# Patient Record
Sex: Female | Born: 1965 | Race: White | Hispanic: No | Smoking: Former smoker
Health system: Southern US, Community
[De-identification: ages and names within clinical notes are randomized; demographics above are authoritative.]

## PROBLEM LIST (undated history)

## (undated) DIAGNOSIS — R519 Headache, unspecified: Secondary | ICD-10-CM

## (undated) DIAGNOSIS — J45909 Unspecified asthma, uncomplicated: Secondary | ICD-10-CM

## (undated) DIAGNOSIS — F419 Anxiety disorder, unspecified: Secondary | ICD-10-CM

## (undated) DIAGNOSIS — R011 Cardiac murmur, unspecified: Secondary | ICD-10-CM

## (undated) DIAGNOSIS — J449 Chronic obstructive pulmonary disease, unspecified: Secondary | ICD-10-CM

## (undated) DIAGNOSIS — R51 Headache: Secondary | ICD-10-CM

## (undated) DIAGNOSIS — Z8489 Family history of other specified conditions: Secondary | ICD-10-CM

## (undated) DIAGNOSIS — C4441 Basal cell carcinoma of skin of scalp and neck: Secondary | ICD-10-CM

## (undated) DIAGNOSIS — D649 Anemia, unspecified: Secondary | ICD-10-CM

## (undated) DIAGNOSIS — F4310 Post-traumatic stress disorder, unspecified: Secondary | ICD-10-CM

## (undated) DIAGNOSIS — Z8719 Personal history of other diseases of the digestive system: Secondary | ICD-10-CM

## (undated) DIAGNOSIS — K219 Gastro-esophageal reflux disease without esophagitis: Secondary | ICD-10-CM

## (undated) DIAGNOSIS — E079 Disorder of thyroid, unspecified: Secondary | ICD-10-CM

## (undated) DIAGNOSIS — F32A Depression, unspecified: Secondary | ICD-10-CM

## (undated) DIAGNOSIS — R112 Nausea with vomiting, unspecified: Secondary | ICD-10-CM

## (undated) DIAGNOSIS — M1990 Unspecified osteoarthritis, unspecified site: Secondary | ICD-10-CM

## (undated) DIAGNOSIS — F329 Major depressive disorder, single episode, unspecified: Secondary | ICD-10-CM

## (undated) DIAGNOSIS — G43909 Migraine, unspecified, not intractable, without status migrainosus: Secondary | ICD-10-CM

## (undated) DIAGNOSIS — E669 Obesity, unspecified: Secondary | ICD-10-CM

## (undated) DIAGNOSIS — F319 Bipolar disorder, unspecified: Secondary | ICD-10-CM

## (undated) DIAGNOSIS — M797 Fibromyalgia: Secondary | ICD-10-CM

## (undated) HISTORY — PX: MULTIPLE TOOTH EXTRACTIONS: SHX2053

## (undated) HISTORY — DX: Chronic obstructive pulmonary disease, unspecified: J44.9

## (undated) HISTORY — PX: MOLE REMOVAL: SHX2046

## (undated) HISTORY — PX: ABDOMINAL HYSTERECTOMY: SHX81

## (undated) HISTORY — DX: Unspecified osteoarthritis, unspecified site: M19.90

---

## 1998-04-18 ENCOUNTER — Other Ambulatory Visit: Admission: RE | Admit: 1998-04-18 | Discharge: 1998-04-18 | Payer: Self-pay | Admitting: Family Medicine

## 1998-04-25 ENCOUNTER — Ambulatory Visit (HOSPITAL_COMMUNITY): Admission: RE | Admit: 1998-04-25 | Discharge: 1998-04-25 | Payer: Self-pay | Admitting: Family Medicine

## 1998-04-29 ENCOUNTER — Other Ambulatory Visit: Admission: RE | Admit: 1998-04-29 | Discharge: 1998-04-29 | Payer: Self-pay | Admitting: Family Medicine

## 1998-12-28 ENCOUNTER — Emergency Department (HOSPITAL_COMMUNITY): Admission: EM | Admit: 1998-12-28 | Discharge: 1998-12-28 | Payer: Self-pay | Admitting: Emergency Medicine

## 1999-03-27 ENCOUNTER — Emergency Department (HOSPITAL_COMMUNITY): Admission: EM | Admit: 1999-03-27 | Discharge: 1999-03-27 | Payer: Self-pay | Admitting: *Deleted

## 1999-11-05 ENCOUNTER — Encounter: Payer: Self-pay | Admitting: Emergency Medicine

## 1999-11-05 ENCOUNTER — Emergency Department (HOSPITAL_COMMUNITY): Admission: EM | Admit: 1999-11-05 | Discharge: 1999-11-05 | Payer: Self-pay | Admitting: Emergency Medicine

## 1999-11-11 ENCOUNTER — Emergency Department (HOSPITAL_COMMUNITY): Admission: EM | Admit: 1999-11-11 | Discharge: 1999-11-11 | Payer: Self-pay | Admitting: Emergency Medicine

## 2000-02-12 ENCOUNTER — Emergency Department (HOSPITAL_COMMUNITY): Admission: EM | Admit: 2000-02-12 | Discharge: 2000-02-12 | Payer: Self-pay | Admitting: Emergency Medicine

## 2000-02-12 ENCOUNTER — Encounter: Payer: Self-pay | Admitting: Emergency Medicine

## 2000-07-22 ENCOUNTER — Encounter: Payer: Self-pay | Admitting: Emergency Medicine

## 2000-07-22 ENCOUNTER — Emergency Department (HOSPITAL_COMMUNITY): Admission: EM | Admit: 2000-07-22 | Discharge: 2000-07-22 | Payer: Self-pay | Admitting: Emergency Medicine

## 2000-07-24 ENCOUNTER — Emergency Department (HOSPITAL_COMMUNITY): Admission: EM | Admit: 2000-07-24 | Discharge: 2000-07-24 | Payer: Self-pay | Admitting: Emergency Medicine

## 2000-07-26 ENCOUNTER — Emergency Department (HOSPITAL_COMMUNITY): Admission: EM | Admit: 2000-07-26 | Discharge: 2000-07-26 | Payer: Self-pay | Admitting: Emergency Medicine

## 2001-01-03 ENCOUNTER — Encounter: Payer: Self-pay | Admitting: Internal Medicine

## 2001-01-03 ENCOUNTER — Inpatient Hospital Stay (HOSPITAL_COMMUNITY): Admission: EM | Admit: 2001-01-03 | Discharge: 2001-01-06 | Payer: Self-pay | Admitting: Emergency Medicine

## 2001-01-04 ENCOUNTER — Encounter: Payer: Self-pay | Admitting: Internal Medicine

## 2001-04-18 ENCOUNTER — Inpatient Hospital Stay (HOSPITAL_COMMUNITY): Admission: EM | Admit: 2001-04-18 | Discharge: 2001-04-20 | Payer: Self-pay | Admitting: Emergency Medicine

## 2001-04-18 ENCOUNTER — Encounter: Payer: Self-pay | Admitting: Emergency Medicine

## 2001-05-10 ENCOUNTER — Other Ambulatory Visit: Admission: RE | Admit: 2001-05-10 | Discharge: 2001-05-10 | Payer: Self-pay | Admitting: Obstetrics & Gynecology

## 2001-05-10 ENCOUNTER — Encounter: Admission: RE | Admit: 2001-05-10 | Discharge: 2001-05-10 | Payer: Self-pay | Admitting: Obstetrics & Gynecology

## 2004-09-16 ENCOUNTER — Encounter: Admission: RE | Admit: 2004-09-16 | Discharge: 2004-09-16 | Payer: Self-pay | Admitting: Family Medicine

## 2005-01-11 ENCOUNTER — Emergency Department (HOSPITAL_COMMUNITY): Admission: EM | Admit: 2005-01-11 | Discharge: 2005-01-11 | Payer: Self-pay | Admitting: Family Medicine

## 2005-10-26 ENCOUNTER — Emergency Department (HOSPITAL_COMMUNITY): Admission: EM | Admit: 2005-10-26 | Discharge: 2005-10-26 | Payer: Self-pay | Admitting: Emergency Medicine

## 2007-04-26 ENCOUNTER — Emergency Department (HOSPITAL_COMMUNITY): Admission: EM | Admit: 2007-04-26 | Discharge: 2007-04-26 | Payer: Self-pay | Admitting: Emergency Medicine

## 2007-05-29 ENCOUNTER — Emergency Department (HOSPITAL_COMMUNITY): Admission: EM | Admit: 2007-05-29 | Discharge: 2007-05-29 | Payer: Self-pay | Admitting: Emergency Medicine

## 2007-12-09 ENCOUNTER — Emergency Department (HOSPITAL_COMMUNITY): Admission: EM | Admit: 2007-12-09 | Discharge: 2007-12-09 | Payer: Self-pay | Admitting: Emergency Medicine

## 2008-07-04 ENCOUNTER — Encounter: Admission: RE | Admit: 2008-07-04 | Discharge: 2008-07-04 | Payer: Self-pay | Admitting: Family Medicine

## 2008-09-09 ENCOUNTER — Emergency Department (HOSPITAL_COMMUNITY): Admission: EM | Admit: 2008-09-09 | Discharge: 2008-09-09 | Payer: Self-pay | Admitting: Family Medicine

## 2009-09-02 ENCOUNTER — Emergency Department (HOSPITAL_COMMUNITY): Admission: EM | Admit: 2009-09-02 | Discharge: 2009-09-02 | Payer: Self-pay | Admitting: Emergency Medicine

## 2010-01-13 ENCOUNTER — Emergency Department (HOSPITAL_COMMUNITY): Admission: EM | Admit: 2010-01-13 | Discharge: 2010-01-13 | Payer: Self-pay | Admitting: Emergency Medicine

## 2010-12-03 ENCOUNTER — Emergency Department (HOSPITAL_COMMUNITY)
Admission: EM | Admit: 2010-12-03 | Discharge: 2010-12-03 | Payer: Self-pay | Source: Home / Self Care | Admitting: Emergency Medicine

## 2010-12-03 LAB — CBC
HCT: 40 % (ref 36.0–46.0)
Hemoglobin: 13.6 g/dL (ref 12.0–15.0)
MCH: 31.2 pg (ref 26.0–34.0)
MCHC: 34 g/dL (ref 30.0–36.0)
MCV: 91.7 fL (ref 78.0–100.0)
Platelets: 179 10*3/uL (ref 150–400)
RBC: 4.36 MIL/uL (ref 3.87–5.11)
RDW: 12.6 % (ref 11.5–15.5)
WBC: 12.2 10*3/uL — ABNORMAL HIGH (ref 4.0–10.5)

## 2010-12-03 LAB — COMPREHENSIVE METABOLIC PANEL
ALT: 15 U/L (ref 0–35)
AST: 19 U/L (ref 0–37)
Albumin: 4.1 g/dL (ref 3.5–5.2)
Alkaline Phosphatase: 74 U/L (ref 39–117)
BUN: 11 mg/dL (ref 6–23)
CO2: 22 mEq/L (ref 19–32)
Calcium: 9.5 mg/dL (ref 8.4–10.5)
Chloride: 108 mEq/L (ref 96–112)
Creatinine, Ser: 0.7 mg/dL (ref 0.4–1.2)
GFR calc Af Amer: >60 mL/min (ref >60)
GFR calc non Af Amer: >60 mL/min (ref >60)
Glucose, Bld: 141 mg/dL — ABNORMAL HIGH (ref 70–99)
Potassium: 3.8 mEq/L (ref 3.5–5.1)
Sodium: 139 mEq/L (ref 135–145)
Total Bilirubin: 0.4 mg/dL (ref 0.3–1.2)
Total Protein: 6.9 g/dL (ref 6.0–8.3)

## 2010-12-03 LAB — DIFFERENTIAL
Basophils Absolute: 0 10*3/uL (ref 0.0–0.1)
Basophils Relative: 0 % (ref 0–1)
Eosinophils Absolute: 0.1 10*3/uL (ref 0.0–0.7)
Eosinophils Relative: 1 % (ref 0–5)
Lymphocytes Relative: 15 % (ref 12–46)
Lymphs Abs: 1.9 10*3/uL (ref 0.7–4.0)
Monocytes Absolute: 0.6 10*3/uL (ref 0.1–1.0)
Monocytes Relative: 5 % (ref 3–12)
Neutro Abs: 9.7 10*3/uL — ABNORMAL HIGH (ref 1.7–7.7)
Neutrophils Relative %: 79 % — ABNORMAL HIGH (ref 43–77)

## 2010-12-03 LAB — URINE MICROSCOPIC-ADD ON

## 2010-12-03 LAB — URINALYSIS, ROUTINE W REFLEX MICROSCOPIC
Bilirubin Urine: NEGATIVE
Ketones, ur: NEGATIVE mg/dL
Leukocytes, UA: NEGATIVE
Nitrite: NEGATIVE
Protein, ur: 30 mg/dL — AB
Specific Gravity, Urine: 1.024 (ref 1.005–1.030)
Urine Glucose, Fasting: NEGATIVE mg/dL
Urobilinogen, UA: 0.2 mg/dL (ref 0.0–1.0)
pH: 6.5 (ref 5.0–8.0)

## 2010-12-03 LAB — POCT PREGNANCY, URINE: Preg Test, Ur: NEGATIVE

## 2010-12-05 ENCOUNTER — Observation Stay (HOSPITAL_COMMUNITY)
Admission: EM | Admit: 2010-12-05 | Discharge: 2010-12-05 | Payer: Self-pay | Source: Home / Self Care | Admitting: Emergency Medicine

## 2010-12-15 LAB — DIFFERENTIAL
Basophils Absolute: 0 10*3/uL (ref 0.0–0.1)
Basophils Relative: 0 % (ref 0–1)
Eosinophils Absolute: 0 10*3/uL (ref 0.0–0.7)
Eosinophils Relative: 0 % (ref 0–5)
Lymphocytes Relative: 12 % (ref 12–46)
Lymphs Abs: 1.4 10*3/uL (ref 0.7–4.0)
Monocytes Absolute: 0.7 10*3/uL (ref 0.1–1.0)
Monocytes Relative: 6 % (ref 3–12)
Neutro Abs: 9.1 10*3/uL — ABNORMAL HIGH (ref 1.7–7.7)
Neutrophils Relative %: 81 % — ABNORMAL HIGH (ref 43–77)

## 2010-12-15 LAB — COMPREHENSIVE METABOLIC PANEL
ALT: 16 U/L (ref 0–35)
AST: 18 U/L (ref 0–37)
Albumin: 4.3 g/dL (ref 3.5–5.2)
Alkaline Phosphatase: 77 U/L (ref 39–117)
BUN: 12 mg/dL (ref 6–23)
CO2: 27 mEq/L (ref 19–32)
Calcium: 9.6 mg/dL (ref 8.4–10.5)
Chloride: 105 mEq/L (ref 96–112)
Creatinine, Ser: 0.9 mg/dL (ref 0.4–1.2)
GFR calc Af Amer: >60 mL/min (ref >60)
GFR calc non Af Amer: >60 mL/min (ref >60)
Glucose, Bld: 141 mg/dL — ABNORMAL HIGH (ref 70–99)
Potassium: 3.2 mEq/L — ABNORMAL LOW (ref 3.5–5.1)
Sodium: 142 mEq/L (ref 135–145)
Total Bilirubin: 0.6 mg/dL (ref 0.3–1.2)
Total Protein: 6.6 g/dL (ref 6.0–8.3)

## 2010-12-15 LAB — POCT PREGNANCY, URINE: Preg Test, Ur: NEGATIVE

## 2010-12-15 LAB — URINALYSIS, ROUTINE W REFLEX MICROSCOPIC
Bilirubin Urine: NEGATIVE
Hgb urine dipstick: NEGATIVE
Ketones, ur: NEGATIVE mg/dL
Leukocytes, UA: NEGATIVE
Nitrite: NEGATIVE
Protein, ur: NEGATIVE mg/dL
Specific Gravity, Urine: 1.02 (ref 1.005–1.030)
Urine Glucose, Fasting: NEGATIVE mg/dL
Urobilinogen, UA: 1 mg/dL (ref 0.0–1.0)
pH: 8.5 — ABNORMAL HIGH (ref 5.0–8.0)

## 2010-12-15 LAB — CBC
HCT: 38.4 % (ref 36.0–46.0)
Hemoglobin: 12.8 g/dL (ref 12.0–15.0)
MCH: 30.5 pg (ref 26.0–34.0)
MCHC: 33.3 g/dL (ref 30.0–36.0)
MCV: 91.6 fL (ref 78.0–100.0)
Platelets: 180 10*3/uL (ref 150–400)
RBC: 4.19 MIL/uL (ref 3.87–5.11)
RDW: 12.7 % (ref 11.5–15.5)
WBC: 11.2 10*3/uL — ABNORMAL HIGH (ref 4.0–10.5)

## 2010-12-15 LAB — URINE MICROSCOPIC-ADD ON

## 2010-12-15 LAB — LIPASE, BLOOD: Lipase: 14 U/L (ref 11–59)

## 2010-12-21 ENCOUNTER — Encounter: Payer: Self-pay | Admitting: Family Medicine

## 2010-12-21 ENCOUNTER — Encounter: Payer: Self-pay | Admitting: Orthopedic Surgery

## 2011-02-18 LAB — CULTURE, ROUTINE-ABSCESS

## 2011-03-05 LAB — POCT I-STAT, CHEM 8
Calcium, Ion: 1.05 mmol/L — ABNORMAL LOW (ref 1.12–1.32)
HCT: 41 % (ref 36.0–46.0)
Hemoglobin: 13.9 g/dL (ref 12.0–15.0)
Sodium: 136 mEq/L (ref 135–145)
TCO2: 20 mmol/L (ref 0–100)

## 2011-03-05 LAB — COMPREHENSIVE METABOLIC PANEL
AST: 17 U/L (ref 0–37)
Albumin: 3.9 g/dL (ref 3.5–5.2)
Alkaline Phosphatase: 85 U/L (ref 39–117)
BUN: 13 mg/dL (ref 6–23)
CO2: 21 mEq/L (ref 19–32)
Chloride: 105 mEq/L (ref 96–112)
Creatinine, Ser: 0.7 mg/dL (ref 0.4–1.2)
GFR calc Af Amer: >60 mL/min (ref >60)
GFR calc non Af Amer: >60 mL/min (ref >60)
Potassium: 3.9 mEq/L (ref 3.5–5.1)
Total Bilirubin: 0.6 mg/dL (ref 0.3–1.2)

## 2011-03-05 LAB — DIFFERENTIAL
Basophils Absolute: 0 10*3/uL (ref 0.0–0.1)
Basophils Relative: 0 % (ref 0–1)
Eosinophils Relative: 2 % (ref 0–5)
Lymphocytes Relative: 8 % — ABNORMAL LOW (ref 12–46)
Monocytes Absolute: 0.9 10*3/uL (ref 0.1–1.0)

## 2011-03-05 LAB — URINALYSIS, ROUTINE W REFLEX MICROSCOPIC
Ketones, ur: 15 mg/dL — AB
Nitrite: NEGATIVE
Protein, ur: NEGATIVE mg/dL
Urobilinogen, UA: 1 mg/dL (ref 0.0–1.0)

## 2011-03-05 LAB — PREGNANCY, URINE: Preg Test, Ur: NEGATIVE

## 2011-03-05 LAB — CBC
HCT: 39.5 % (ref 36.0–46.0)
MCV: 94.9 fL (ref 78.0–100.0)
Platelets: 131 10*3/uL — ABNORMAL LOW (ref 150–400)
RBC: 4.17 MIL/uL (ref 3.87–5.11)
WBC: 14.5 10*3/uL — ABNORMAL HIGH (ref 4.0–10.5)

## 2011-03-05 LAB — WET PREP, GENITAL
Trich, Wet Prep: NONE SEEN
Yeast Wet Prep HPF POC: NONE SEEN

## 2011-08-20 LAB — COMPREHENSIVE METABOLIC PANEL
ALT: 15
BUN: 18
CO2: 21
Calcium: 9.2
Creatinine, Ser: 0.78
GFR calc non Af Amer: >60
Glucose, Bld: 179 — ABNORMAL HIGH
Sodium: 137

## 2011-08-20 LAB — DIFFERENTIAL
Eosinophils Absolute: 0
Lymphs Abs: 0.5 — ABNORMAL LOW
Neutro Abs: 14.5 — ABNORMAL HIGH
Neutrophils Relative %: 92 — ABNORMAL HIGH

## 2011-08-20 LAB — CBC
HCT: 40.3
Hemoglobin: 13.9
MCHC: 34.5
MCV: 93
RBC: 4.33
RDW: 13.5

## 2011-08-20 LAB — LIPASE, BLOOD: Lipase: 18

## 2011-09-16 LAB — DIFFERENTIAL
Basophils Absolute: 0
Basophils Relative: 1
Eosinophils Absolute: 0
Eosinophils Relative: 0
Monocytes Absolute: 0.5

## 2011-09-16 LAB — URINE CULTURE

## 2011-09-16 LAB — CBC
HCT: 41.1
Platelets: 167
RBC: 4.41
WBC: 8.9

## 2011-09-16 LAB — COMPREHENSIVE METABOLIC PANEL
AST: 16
Albumin: 4
Alkaline Phosphatase: 94
Chloride: 104
GFR calc Af Amer: >60
Potassium: 3.7
Total Bilirubin: 0.8

## 2011-09-16 LAB — URINALYSIS, ROUTINE W REFLEX MICROSCOPIC
Hgb urine dipstick: NEGATIVE
Ketones, ur: 15 — AB
Protein, ur: NEGATIVE
Urobilinogen, UA: 1

## 2011-09-16 LAB — URINE MICROSCOPIC-ADD ON

## 2011-12-16 ENCOUNTER — Other Ambulatory Visit: Payer: Self-pay

## 2011-12-16 ENCOUNTER — Emergency Department (HOSPITAL_COMMUNITY): Payer: Self-pay

## 2011-12-16 ENCOUNTER — Emergency Department (HOSPITAL_COMMUNITY)
Admission: EM | Admit: 2011-12-16 | Discharge: 2011-12-16 | Disposition: A | Discharge status: Home / Self Care | Payer: Self-pay | Attending: Emergency Medicine | Admitting: Emergency Medicine

## 2011-12-16 ENCOUNTER — Encounter (HOSPITAL_COMMUNITY): Payer: Self-pay | Admitting: Emergency Medicine

## 2011-12-16 DIAGNOSIS — R109 Unspecified abdominal pain: Secondary | ICD-10-CM | POA: Insufficient documentation

## 2011-12-16 DIAGNOSIS — S31811A Laceration without foreign body of right buttock, initial encounter: Secondary | ICD-10-CM

## 2011-12-16 DIAGNOSIS — R296 Repeated falls: Secondary | ICD-10-CM | POA: Insufficient documentation

## 2011-12-16 DIAGNOSIS — R55 Syncope and collapse: Secondary | ICD-10-CM | POA: Insufficient documentation

## 2011-12-16 DIAGNOSIS — R110 Nausea: Secondary | ICD-10-CM | POA: Insufficient documentation

## 2011-12-16 DIAGNOSIS — S31809A Unspecified open wound of unspecified buttock, initial encounter: Secondary | ICD-10-CM | POA: Insufficient documentation

## 2011-12-16 HISTORY — DX: Major depressive disorder, single episode, unspecified: F32.9

## 2011-12-16 HISTORY — DX: Disorder of thyroid, unspecified: E07.9

## 2011-12-16 HISTORY — DX: Anxiety disorder, unspecified: F41.9

## 2011-12-16 HISTORY — DX: Post-traumatic stress disorder, unspecified: F43.10

## 2011-12-16 HISTORY — DX: Depression, unspecified: F32.A

## 2011-12-16 LAB — CBC
HCT: 35.5 % — ABNORMAL LOW (ref 36.0–46.0)
MCH: 31.3 pg (ref 26.0–34.0)
MCHC: 33.8 g/dL (ref 30.0–36.0)
MCV: 92.4 fL (ref 78.0–100.0)
Platelets: 140 10*3/uL — ABNORMAL LOW (ref 150–400)
RDW: 13.2 % (ref 11.5–15.5)

## 2011-12-16 LAB — DIFFERENTIAL
Basophils Absolute: 0 10*3/uL (ref 0.0–0.1)
Eosinophils Absolute: 0.1 10*3/uL (ref 0.0–0.7)
Eosinophils Relative: 1 % (ref 0–5)
Monocytes Absolute: 0.7 10*3/uL (ref 0.1–1.0)

## 2011-12-16 LAB — BASIC METABOLIC PANEL
Calcium: 9.2 mg/dL (ref 8.4–10.5)
Creatinine, Ser: 0.74 mg/dL (ref 0.50–1.10)
GFR calc non Af Amer: >90 mL/min (ref >90)
Glucose, Bld: 103 mg/dL — ABNORMAL HIGH (ref 70–99)
Sodium: 138 mEq/L (ref 135–145)

## 2011-12-16 LAB — TROPONIN I: Troponin I: <0.3 ng/mL (ref <0.30)

## 2011-12-16 MED ORDER — SODIUM CHLORIDE 0.9 % IV BOLUS (SEPSIS)
1000.0000 mL | Freq: Once | INTRAVENOUS | Status: AC
  Administered 2011-12-16: 1000 mL via INTRAVENOUS

## 2011-12-16 MED ORDER — IBUPROFEN 800 MG PO TABS
800.0000 mg | ORAL_TABLET | Freq: Once | ORAL | Status: AC
  Administered 2011-12-16: 800 mg via ORAL
  Filled 2011-12-16: qty 1
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
## 2011-12-16 NOTE — ED Notes (Signed)
Pt verbalized understanding of f/u care. Pt escorted to pov via w/c

## 2011-12-16 NOTE — ED Notes (Signed)
Provider at bedside placing sutures in left hip

## 2011-12-16 NOTE — ED Notes (Signed)
Report received from Jaci, RN.
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
## 2011-12-16 NOTE — ED Notes (Signed)
Pt out of room to radiology. 

## 2011-12-16 NOTE — ED Notes (Signed)
Ambulated to restroom, pt has steady gait. On way back to room pt states she became "swimmy headed and flushed." Notified Carlena Sax, MD new orders received.

## 2011-12-16 NOTE — ED Provider Notes (Signed)
I saw and evaluated the patient, reviewed the resident's note and I agree with the findings and plan.  Patient seen and examined in episode appears to be consistent with a vasovagal episode. History of vagal episodes in the past. Next one time she struck her hand and she passed out. No concern for dangerous cardiac arrhythmia this time. Patient had prodromal symptoms prior to her episode. She feels back to her baseline. Patient's wound to be sutured by the resident and patient will be discharged to home   Toy Baker, MD 12/16/11 2028

## 2011-12-16 NOTE — ED Notes (Signed)
Dr Isaias Cowman at bedside with pt

## 2011-12-16 NOTE — ED Provider Notes (Signed)
History     CSN: [id number]  Arrival date & time 12/16/11  1815   First MD Initiated Contact with Patient 12/16/11 1820      No chief complaint on file.   (Consider location/radiation/quality/duration/timing/severity/associated sxs/prior treatment) Patient is a 46 y.o. female presenting with syncope. The history is provided by the patient. No language interpreter was used.  Loss of Consciousness This is a new problem. The current episode started today. Episode frequency: once. The problem has been resolved. Associated symptoms include abdominal pain and nausea. Pertinent negatives include no chest pain, chills, coughing, fever, rash or vomiting. The symptoms are aggravated by nothing. She has tried nothing for the symptoms. The treatment provided no relief.    No past medical history on file.  No past surgical history on file.  No family history on file.  History  Substance Use Topics  . Smoking status: Not on file  . Smokeless tobacco: Not on file  . Alcohol Use: Not on file    OB History    No data available      Review of Systems  Constitutional: Negative for fever and chills.  Respiratory: Negative for cough and shortness of breath.   Cardiovascular: Positive for syncope. Negative for chest pain and leg swelling.  Gastrointestinal: Positive for nausea and abdominal pain. Negative for vomiting.  Skin: Negative for rash.  All other systems reviewed and are negative.    Allergies  Review of patient's allergies indicates not on file.  Home Medications  No current outpatient prescriptions on file.  There were no vitals taken for this visit.  Physical Exam  Nursing note and vitals reviewed. Constitutional: She is oriented to person, place, and time. She appears well-developed and well-nourished. No distress.  HENT:  Head: Normocephalic and atraumatic.  Eyes: EOM are normal. Pupils are equal, round, and reactive to light.  Neck: Normal range of motion. Neck  supple.  Cardiovascular: Normal rate and regular rhythm.  Exam reveals no friction rub.   No murmur heard. Pulmonary/Chest: Effort normal and breath sounds normal. No respiratory distress. She has no wheezes. She has no rales.  Abdominal: Soft. She exhibits no distension. There is no tenderness. There is no rebound.  Musculoskeletal: Normal range of motion. She exhibits no edema.       Laceration to R buttock, lateral.   Neurological: She is alert and oriented to person, place, and time. No cranial nerve deficit. Coordination normal.  Skin: Skin is warm and dry. She is not diaphoretic.    ED Course  LACERATION REPAIR Date/Time: 12/16/2011 10:04 PM Performed by: Elwin Mocha Authorized by: Lorre Nick T Consent: Verbal consent obtained. Risks and benefits: risks, benefits and alternatives were discussed Consent given by: patient Body area: trunk (R buttock) Laceration length: 2 cm Foreign bodies: no foreign bodies Tendon involvement: none Nerve involvement: none Vascular damage: no Anesthesia: local infiltration Local anesthetic: lidocaine 1% with epinephrine Anesthetic total: 8 ml Patient sedated: no Preparation: Patient was prepped and draped in the usual sterile fashion. Irrigation solution: saline Irrigation method: jet lavage Amount of cleaning: standard Debridement: none Degree of undermining: none Skin closure: staples Number of sutures: 4 Technique: simple Approximation: close Approximation difficulty: simple Patient tolerance: Patient tolerated the procedure well with no immediate complications.   (including critical care time)  Labs Reviewed  CBC - Abnormal; Notable for the following:    RBC 3.84 (*)    HCT 35.5 (*)    Platelets 140 (*)    All other  components within normal limits  BASIC METABOLIC PANEL - Abnormal; Notable for the following:    Glucose, Bld 103 (*)    All other components within normal limits  DIFFERENTIAL  TROPONIN I   Dg Hip  Complete Right  12/16/2011  *RADIOLOGY REPORT*  Clinical Data: Syncope four times today; fell and hit cabinet door with right hip.  Pain and deep laceration along the right hip.  RIGHT HIP - COMPLETE 2+ VIEW  Comparison: Thigh MRI performed 09/16/2004  Findings: There is no evidence of fracture or dislocation.  Both femoral heads are seated normally within their respective acetabula.  No significant degenerative change is appreciated.  The sacroiliac joints are unremarkable in appearance.  The visualized bowel gas pattern is grossly unremarkable in appearance.  The known soft tissue laceration along the proximal right leg is not well characterized on radiograph.  IMPRESSION: No evidence of fracture or dislocation.  Original Report Authenticated By: Tonia Ghent, M.D.   Ct Head Wo Contrast  12/16/2011  *RADIOLOGY REPORT*  Clinical Data: Fall.  Head trauma.  Headache  CT HEAD WITHOUT CONTRAST  Technique:  Contiguous axial images were obtained from the base of the skull through the vertex without contrast.  Comparison: None.  Findings: Ventricle size is normal.  Negative for intracranial hemorrhage.  Negative for infarct or mass.  Negative for skull fracture.  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  Small mastoid sinus effusion on the left.  IMPRESSION: No acute abnormality.  Original Report Authenticated By: Camelia Phenes, M.D.     1. Syncope   2. Laceration of right buttock without foreign body      Date: 12/16/2011  Rate:56  Rhythm: sinus bradycardia  QRS Axis: normal  Intervals: normal  ST/T Wave abnormalities: normal  Conduction Disutrbances:none  Narrative Interpretation:   Old EKG Reviewed: unchanged    MDM  46 year old female presents with a syncopal episode. No prior cardiac, neurologic history. Was cooking over a hot stove and had a feeling of warmth and then passed out. She hit her right buttock on a wooden cabinet and sustained a laceration. She has associated nausea, vomiting, mild  dyspnea. Vitals are stable. On exam patient's heart and lungs are normal. She is neuro intact and moving all extremities. She has no coordination deficits.  We'll check EKG,  baseline labs, troponin head CT. Head CT, reviewed by me, read as above, negative. Right hip x-ray negative for foreign bodies or fractures. Labs normal. Patient syncope likely vasovagal. On reexam she's feeling much better 6 visit she can go home.  Right hip wound irrigated and repaired with 4 staples. A laceration repair as above. Instructed to followup with Dr. Tanya Nones in 2-3 days for her syncope.   Elwin Mocha, MD 12/16/11 [phone]

## 2011-12-17 NOTE — ED Provider Notes (Signed)
I saw and evaluated the patient, reviewed the resident's note and I agree with the findings and plan.  Toy Baker, MD 12/17/11 [phone]

## 2014-06-27 ENCOUNTER — Emergency Department (HOSPITAL_COMMUNITY): Payer: Self-pay

## 2014-06-27 ENCOUNTER — Emergency Department (HOSPITAL_COMMUNITY)
Admission: EM | Admit: 2014-06-27 | Discharge: 2014-06-27 | Disposition: A | Discharge status: Home / Self Care | Payer: Self-pay | Attending: Emergency Medicine | Admitting: Emergency Medicine

## 2014-06-27 ENCOUNTER — Encounter (HOSPITAL_COMMUNITY): Payer: Self-pay | Admitting: Emergency Medicine

## 2014-06-27 DIAGNOSIS — F411 Generalized anxiety disorder: Secondary | ICD-10-CM | POA: Insufficient documentation

## 2014-06-27 DIAGNOSIS — Z8639 Personal history of other endocrine, nutritional and metabolic disease: Secondary | ICD-10-CM | POA: Insufficient documentation

## 2014-06-27 DIAGNOSIS — R1084 Generalized abdominal pain: Secondary | ICD-10-CM | POA: Insufficient documentation

## 2014-06-27 DIAGNOSIS — Z862 Personal history of diseases of the blood and blood-forming organs and certain disorders involving the immune mechanism: Secondary | ICD-10-CM | POA: Insufficient documentation

## 2014-06-27 DIAGNOSIS — R079 Chest pain, unspecified: Secondary | ICD-10-CM | POA: Insufficient documentation

## 2014-06-27 DIAGNOSIS — R112 Nausea with vomiting, unspecified: Secondary | ICD-10-CM | POA: Insufficient documentation

## 2014-06-27 DIAGNOSIS — R197 Diarrhea, unspecified: Secondary | ICD-10-CM | POA: Insufficient documentation

## 2014-06-27 DIAGNOSIS — R0789 Other chest pain: Secondary | ICD-10-CM | POA: Insufficient documentation

## 2014-06-27 DIAGNOSIS — Z3202 Encounter for pregnancy test, result negative: Secondary | ICD-10-CM | POA: Insufficient documentation

## 2014-06-27 DIAGNOSIS — F419 Anxiety disorder, unspecified: Secondary | ICD-10-CM

## 2014-06-27 LAB — CBC
HEMATOCRIT: 42.8 % (ref 36.0–46.0)
HEMOGLOBIN: 14.5 g/dL (ref 12.0–15.0)
MCH: 31.7 pg (ref 26.0–34.0)
MCHC: 33.9 g/dL (ref 30.0–36.0)
MCV: 93.4 fL (ref 78.0–100.0)
Platelets: 149 10*3/uL — ABNORMAL LOW (ref 150–400)
RBC: 4.58 MIL/uL (ref 3.87–5.11)
RDW: 13.7 % (ref 11.5–15.5)
WBC: 12.7 10*3/uL — ABNORMAL HIGH (ref 4.0–10.5)

## 2014-06-27 LAB — URINE MICROSCOPIC-ADD ON

## 2014-06-27 LAB — BASIC METABOLIC PANEL
Anion gap: 16 — ABNORMAL HIGH (ref 5–15)
BUN: 16 mg/dL (ref 6–23)
CALCIUM: 9.7 mg/dL (ref 8.4–10.5)
CO2: 19 meq/L (ref 19–32)
Chloride: 105 mEq/L (ref 96–112)
Creatinine, Ser: 0.58 mg/dL (ref 0.50–1.10)
GFR calc Af Amer: >90 mL/min (ref >90)
GFR calc non Af Amer: >90 mL/min (ref >90)
GLUCOSE: 138 mg/dL — AB (ref 70–99)
POTASSIUM: 4.3 meq/L (ref 3.7–5.3)
SODIUM: 140 meq/L (ref 137–147)

## 2014-06-27 LAB — URINALYSIS, ROUTINE W REFLEX MICROSCOPIC
Glucose, UA: NEGATIVE mg/dL
Ketones, ur: 15 mg/dL — AB
LEUKOCYTES UA: NEGATIVE
Nitrite: NEGATIVE
PROTEIN: 30 mg/dL — AB
Specific Gravity, Urine: 1.028 (ref 1.005–1.030)
UROBILINOGEN UA: 0.2 mg/dL (ref 0.0–1.0)
pH: 5 (ref 5.0–8.0)

## 2014-06-27 LAB — LIPASE, BLOOD: Lipase: 21 U/L (ref 11–59)

## 2014-06-27 LAB — HEPATIC FUNCTION PANEL
ALK PHOS: 112 U/L (ref 39–117)
ALT: 14 U/L (ref 0–35)
AST: 16 U/L (ref 0–37)
Albumin: 4.4 g/dL (ref 3.5–5.2)
BILIRUBIN TOTAL: 0.6 mg/dL (ref 0.3–1.2)
Total Protein: 7.2 g/dL (ref 6.0–8.3)

## 2014-06-27 LAB — PRO B NATRIURETIC PEPTIDE: PRO B NATRI PEPTIDE: 60.1 pg/mL (ref 0–125)

## 2014-06-27 LAB — I-STAT TROPONIN, ED: TROPONIN I, POC: 0 ng/mL (ref 0.00–0.08)

## 2014-06-27 LAB — PREGNANCY, URINE: PREG TEST UR: NEGATIVE

## 2014-06-27 MED ORDER — ONDANSETRON 4 MG PO TBDP
8.0000 mg | ORAL_TABLET | Freq: Once | ORAL | Status: AC
  Administered 2014-06-27: 8 mg via ORAL
  Filled 2014-06-27: qty 2

## 2014-06-27 MED ORDER — PROMETHAZINE HCL 25 MG/ML IJ SOLN
25.0000 mg | Freq: Once | INTRAMUSCULAR | Status: AC
  Administered 2014-06-27: 25 mg via INTRAVENOUS
  Filled 2014-06-27: qty 1

## 2014-06-27 MED ORDER — SODIUM CHLORIDE 0.9 % IV BOLUS (SEPSIS)
1000.0000 mL | Freq: Once | INTRAVENOUS | Status: AC
  Administered 2014-06-27: 1000 mL via INTRAVENOUS

## 2014-06-27 MED ORDER — LORAZEPAM 2 MG/ML IJ SOLN
1.0000 mg | Freq: Once | INTRAMUSCULAR | Status: AC
  Administered 2014-06-27: 1 mg via INTRAVENOUS
  Filled 2014-06-27: qty 1

## 2014-06-27 MED ORDER — ONDANSETRON HCL 4 MG PO TABS: 4.0000 mg | ORAL_TABLET | Freq: Three times a day (TID) | ORAL | Status: DC | PRN

## 2014-06-27 MED ORDER — LORAZEPAM 1 MG PO TABS: 1.0000 mg | ORAL_TABLET | Freq: Once | ORAL | Status: DC

## 2014-06-27 NOTE — Discharge Instructions (Signed)
Please follow up with your primary care physician in 1-2 days. If you do not have one please call the [facility] number listed above. Please read all discharge instructions and return precautions.  ° ° °Chest Pain (Nonspecific) °It is often hard to give a specific diagnosis for the cause of chest pain. There is always a chance that your pain could be related to something serious, such as a heart attack or a blood clot in the lungs. You need to follow up with your health care provider for further evaluation. °CAUSES  °· Heartburn. °· Pneumonia or bronchitis. °· Anxiety or stress. °· Inflammation around your heart (pericarditis) or lung (pleuritis or pleurisy). °· A blood clot in the lung. °· A collapsed lung (pneumothorax). It can develop suddenly on its own (spontaneous pneumothorax) or from trauma to the chest. °· Shingles infection (herpes zoster virus). °The chest wall is composed of bones, muscles, and cartilage. Any of these can be the source of the pain. °· The bones can be bruised by injury. °· The muscles or cartilage can be strained by coughing or overwork. °· The cartilage can be affected by inflammation and become sore (costochondritis). °DIAGNOSIS  °Lab tests or other studies may be needed to find the cause of your pain. Your health care provider may have you take a test called an ambulatory electrocardiogram (ECG). An ECG records your heartbeat patterns over a 24-hour period. You may also have other tests, such as: °· Transthoracic echocardiogram (TTE). During echocardiography, sound waves are used to evaluate how blood flows through your heart. °· Transesophageal echocardiogram (TEE). °· Cardiac monitoring. This allows your health care provider to monitor your heart rate and rhythm in real time. °· Holter monitor. This is a portable device that records your heartbeat and can help diagnose heart arrhythmias. It allows your health care provider to track your heart activity for  several days, if needed. °· Stress tests by exercise or by giving medicine that makes the heart beat faster. °TREATMENT  °· Treatment depends on what may be causing your chest pain. Treatment may include: °¨ Acid blockers for heartburn. °¨ Anti-inflammatory medicine. °¨ Pain medicine for inflammatory conditions. °¨ Antibiotics if an infection is present. °· You may be advised to change lifestyle habits. This includes stopping smoking and avoiding alcohol, caffeine, and chocolate. °· You may be advised to keep your head raised (elevated) when sleeping. This reduces the chance of acid going backward from your stomach into your esophagus. °Most of the time, nonspecific chest pain will improve within 2-3 days with rest and mild pain medicine.  °HOME CARE INSTRUCTIONS  °· If antibiotics were prescribed, take them as directed. Finish them even if you start to feel better. °· For the next few days, avoid physical activities that bring on chest pain. Continue physical activities as directed. °· Do not use any tobacco products, including cigarettes, chewing tobacco, or electronic cigarettes. °· Avoid drinking alcohol. °· Only take medicine as directed by your health care provider. °· Follow your health care provider's suggestions for further testing if your chest pain does not go away. °· Keep any follow-up appointments you made. If you do not go to an appointment, you could develop lasting (chronic) problems with pain. If there is any problem keeping an appointment, call to reschedule. °SEEK MEDICAL CARE IF:  °· Your chest pain does not go away, even after treatment. °· You have a rash with blisters on your chest. °· You have a fever. °  SEEK IMMEDIATE MEDICAL CARE IF:   You have increased chest pain or pain that spreads to your arm, neck, jaw, back, or abdomen.  You have shortness of breath.  You have an increasing cough, or you cough up blood.  You have severe back or abdominal pain.  You feel nauseous or  vomit.  You have severe weakness.  You faint.  You have chills. This is an emergency. Do not wait to see if the pain will go away. Get medical help at once. Call your local emergency services (911 in [place] Do not drive yourself to the hospital. MAKE SURE YOU:   Understand these instructions.  Will watch your condition.  Will get help right away if you are not doing well or get worse. Document Released: 08/26/2005 Document Revised: 11/21/2013 Document Reviewed: 06/21/2008 [facility] Patient Information 2015 [place], [place]. This information is not intended to replace advice given to you by your health care provider. Make sure you discuss any questions you have with your health care provider. Viral Gastroenteritis Viral gastroenteritis is also known as stomach flu. This condition affects the stomach and intestinal tract. It can cause sudden diarrhea and vomiting. The illness typically lasts 3 to 8 days. Most people develop an immune response that eventually gets rid of the virus. While this natural response develops, the virus can make you quite ill. CAUSES  Many different viruses can cause gastroenteritis, such as rotavirus or noroviruses. You can catch one of these viruses by consuming contaminated food or water. You may also catch a virus by sharing utensils or other personal items with an infected person or by touching a contaminated surface. SYMPTOMS  The most common symptoms are diarrhea and vomiting. These problems can cause a severe loss of body fluids (dehydration) and a body salt (electrolyte) imbalance. Other symptoms may include:  Fever.  Headache.  Fatigue.  Abdominal pain. DIAGNOSIS  Your caregiver can usually diagnose viral gastroenteritis based on your symptoms and a physical exam. A stool sample may also be taken to test for the presence of viruses or other infections. TREATMENT  This illness typically goes away on its own. Treatments are aimed at rehydration. The most  serious cases of viral gastroenteritis involve vomiting so severely that you are not able to keep fluids down. In these cases, fluids must be given through an intravenous line (IV). HOME CARE INSTRUCTIONS   Drink enough fluids to keep your urine clear or pale yellow. Drink small amounts of fluids frequently and increase the amounts as tolerated.  Ask your caregiver for specific rehydration instructions.  Avoid:  Foods high in sugar.  Alcohol.  Carbonated drinks.  Tobacco.  Juice.  Caffeine drinks.  Extremely hot or cold fluids.  Fatty, greasy foods.  Too much intake of anything at one time.  Dairy products until 24 to 48 hours after diarrhea stops.  You may consume probiotics. Probiotics are active cultures of beneficial bacteria. They may lessen the amount and number of diarrheal stools in adults. Probiotics can be found in yogurt with active cultures and in supplements.  Wash your hands well to avoid spreading the virus.  Only take over-the-counter or prescription medicines for pain, discomfort, or fever as directed by your caregiver. Do not give aspirin to children. Antidiarrheal medicines are not recommended.  Ask your caregiver if you should continue to take your regular prescribed and over-the-counter medicines.  Keep all follow-up appointments as directed by your caregiver. SEEK IMMEDIATE MEDICAL CARE IF:   You are unable to  keep fluids down.  You do not urinate at least once every 6 to 8 hours.  You develop shortness of breath.  You notice blood in your stool or vomit. This may look like coffee grounds.  You have abdominal pain that increases or is concentrated in one small area (localized).  You have persistent vomiting or diarrhea.  You have a fever.  The patient is a child younger than 3 months, and he or she has a fever.  The patient is a child older than 3 months, and he or she has a fever and persistent symptoms.  The patient is a child older  than 3 months, and he or she has a fever and symptoms suddenly get worse.  The patient is a baby, and he or she has no tears when crying. MAKE SURE YOU:   Understand these instructions.  Will watch your condition.  Will get help right away if you are not doing well or get worse. Document Released: 11/16/2005 Document Revised: 02/08/2012 Document Reviewed: 09/02/2011 [facility] Patient Information 2015 [place], [place]. This information is not intended to replace advice given to you by your health care provider. Make sure you discuss any questions you have with your health care provider.  Generalized Anxiety Disorder Generalized anxiety disorder (GAD) is a mental disorder. It interferes with life functions, including relationships, work, and school. GAD is different from normal anxiety, which everyone experiences at some point in their lives in response to specific life events and activities. Normal anxiety actually helps [place] prepare for and get through these life events and activities. Normal anxiety goes away after the event or activity is over.  GAD causes anxiety that is not necessarily related to specific events or activities. It also causes excess anxiety in proportion to specific events or activities. The anxiety associated with GAD is also difficult to control. GAD can vary from mild to severe. People with severe GAD can have intense waves of anxiety with physical symptoms (panic attacks).  SYMPTOMS The anxiety and worry associated with GAD are difficult to control. This anxiety and worry are related to many life events and activities and also occur more days than not for 6 months or longer. People with GAD also have three or more of the following symptoms (one or more in children):  Restlessness.   Fatigue.  Difficulty concentrating.   Irritability.  Muscle tension.  Difficulty sleeping or unsatisfying sleep. DIAGNOSIS GAD is diagnosed through an assessment by your health care  provider. Your health care provider will ask you questions aboutyour mood,physical symptoms, and events in your life. Your health care provider may ask you about your medical history and use of alcohol or drugs, including prescription medicines. Your health care provider may also do a physical exam and blood tests. Certain medical conditions and the use of certain substances can cause symptoms similar to those associated with GAD. Your health care provider may refer you to a mental health specialist for further evaluation. TREATMENT The following therapies are usually used to treat GAD:   Medication. Antidepressant medication usually is prescribed for long-term daily control. Antianxiety medicines may be added in severe cases, especially when panic attacks occur.   Talk therapy (psychotherapy). Certain types of talk therapy can be helpful in treating GAD by providing support, education, and guidance. A form of talk therapy called cognitive behavioral therapy can teach you healthy ways to think about and react to daily life events and activities.  Stress managementtechniques. These include yoga, meditation, and exercise  and can be very helpful when they are practiced regularly. A mental health specialist can help determine which treatment is best for you. Some people see improvement with one therapy. However, other people require a combination of therapies. Document Released: 03/13/2013 Document Revised: 04/02/2014 Document Reviewed: 03/13/2013 [facility] Patient Information 2015 [place], [place]. This information is not intended to replace advice given to you by your health care provider. Make sure you discuss any questions you have with your health care provider.

## 2014-06-27 NOTE — ED Notes (Addendum)
Pt thinks she is having a panic attack due to high stress levels at home. Reports breaking out in a sweat, cant take a deep breath, n/v and having mid and left side side chest pain and into her right arm. ekg done at triage, airway intact.

## 2014-06-27 NOTE — ED Notes (Signed)
Nurse first rounds, pt not feeling good, taken to triage waiting, VSS updated. Pt tearful. Reports PTSD and worry over her son. Emotional support given

## 2014-06-27 NOTE — ED Notes (Signed)
Attempted to draw pts labs was unsuccessful  

## 2014-06-27 NOTE — ED Notes (Signed)
PT ambulated with baseline gait; VSS; A&Ox3; no signs of distress; respirations even and unlabored; skin warm and dry; no questions upon discharge.  

## 2014-06-27 NOTE — ED Notes (Signed)
Pt has pants off and gown open; states she soiled her pants; paper scrubs given. Placed back on monitor.

## 2014-06-27 NOTE — ED Provider Notes (Signed)
CSN: [id number]     Arrival date & time 06/27/14  1605 History   First MD Initiated Contact with Patient 06/27/14 1735     Chief Complaint  Patient presents with  . Chest Pain  . Anxiety     (Consider location/radiation/quality/duration/timing/severity/associated sxs/prior Treatment) HPI Comments: Patient is a 48 year old female past medical history significant for depression, anxiety, PTSD, thyroid disease presenting to the emergency department for central chest pain without radiation with associated nausea, nonbloody nonbilious vomiting, nonbloody diarrhea that began at 3 AM this morning. Patient states is her typical anxiety attack symptoms for her but she'll more severe today. She has been unable to take anything to help her symptoms at home. Denies any known personal cardiac disease. 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 Patient states she's had increased stress at home as her son has fallen into drugs and has left home. PERC negative.   Patient is a 48 y.o. female presenting with chest pain and anxiety.  Chest Pain Associated symptoms: abdominal pain, anxiety, nausea and vomiting   Associated symptoms: no fever   Anxiety Associated symptoms include abdominal pain, chest pain, nausea and vomiting. Pertinent negatives include no chills or fever.    Past Medical History  Diagnosis Date  . Thyroid disease   . Depression   . Anxiety   . PTSD (post-traumatic stress disorder)    History reviewed. No pertinent past surgical history. History reviewed. No pertinent family history. History  Substance Use Topics  . Smoking status: Not on file  . Smokeless tobacco: Not on file  . Alcohol Use: Not on file     Comment: on occasion   OB History   Grav Para Term Preterm Abortions TAB SAB Ect Mult Living                 Review of Systems  Constitutional: Negative for fever and chills.  Cardiovascular: Positive for chest pain.  Gastrointestinal: Positive for nausea,  vomiting, abdominal pain and diarrhea.  Psychiatric/Behavioral: The patient is nervous/anxious.   All other systems reviewed and are negative.     Allergies  Ciprofloxacin  Home Medications   Prior to Admission medications   Medication Sig Start Date End Date Taking? Authorizing Provider  ondansetron (ZOFRAN) 4 MG tablet Take 1 tablet (4 mg total) by mouth every 8 (eight) hours as needed for nausea or vomiting. 06/27/14   Anderson Malta L Emnet Monk, PA-C   BP 126/63  Pulse 66  Temp(Src) 98.6 F (37 C) (Oral)  Resp 18  SpO2 100% Physical Exam  Nursing note and vitals reviewed. Constitutional: She is oriented to person, place, and time. She appears well-developed and well-nourished. No distress.  Vomiting in room, no blood in emesis.   HENT:  Head: Normocephalic and atraumatic.  Right Ear: External ear normal.  Left Ear: External ear normal.  Nose: Nose normal.  Mouth/Throat: Oropharynx is clear and moist.  Eyes: Conjunctivae are normal.  Neck: Normal range of motion. Neck supple.  Cardiovascular: Normal rate, regular rhythm and normal heart sounds.   Pulmonary/Chest: Effort normal and breath sounds normal. No respiratory distress.  Abdominal: Soft. Normal appearance and bowel sounds are normal. She exhibits no distension. There is generalized tenderness. There is no rigidity, no rebound and no guarding.  Musculoskeletal: Normal range of motion. She exhibits no edema.  Neurological: She is alert and oriented to person, place, and time.  Skin: Skin is warm and dry. She is not diaphoretic.  Psychiatric: She has a normal mood  and affect.    ED Course  Procedures (including critical care time) Medications  ondansetron (ZOFRAN-ODT) disintegrating tablet 8 mg (8 mg Oral Given 06/27/14 1615)  promethazine (PHENERGAN) injection 25 mg (25 mg Intravenous Given 06/27/14 1824)  sodium chloride 0.9 % bolus 1,000 mL (1,000 mLs Intravenous New Bag/Given 06/27/14 1824)  LORazepam (ATIVAN)  injection 1 mg (1 mg Intravenous Given 06/27/14 1824)    Labs Review Labs Reviewed  CBC - Abnormal; Notable for the following:    WBC 12.7 (*)    Platelets 149 (*)    All other components within normal limits  BASIC METABOLIC PANEL - Abnormal; Notable for the following:    Glucose, Bld 138 (*)    Anion gap 16 (*)    All other components within normal limits  URINALYSIS, ROUTINE W REFLEX MICROSCOPIC - Abnormal; Notable for the following:    Hgb urine dipstick MODERATE (*)    Bilirubin Urine SMALL (*)    Ketones, ur 15 (*)    Protein, ur 30 (*)    All other components within normal limits  URINE MICROSCOPIC-ADD ON - Abnormal; Notable for the following:    Casts GRANULAR CAST (*)    All other components within normal limits  PRO B NATRIURETIC PEPTIDE  PREGNANCY, URINE  HEPATIC FUNCTION PANEL  LIPASE, BLOOD  I-STAT TROPOININ, ED    Imaging Review Dg Chest 2 View  06/27/2014   CLINICAL DATA:  Chest pain.  Vomiting episodes.  EXAM: CHEST  2 VIEW  COMPARISON:  05/29/2007  FINDINGS: Skin fold over the inferior lateral right hemi thorax on the frontal radiograph. Midline trachea. Normal heart size and mediastinal contours. No pleural effusion or pneumothorax. EKG lead artifacts projecting over the chest bilaterally. Clear lungs.  IMPRESSION: No acute cardiopulmonary disease.   Electronically Signed   By: Abigail Miyamoto M.D.   On: 06/27/2014 17:41     EKG Interpretation None      MDM   Final diagnoses:  Chest pain, atypical  Nausea vomiting and diarrhea  Anxiety    Filed Vitals:   06/27/14 1947  BP: 126/63  Pulse:   Temp:   Resp: 18   Afebrile, NAD, non-toxic appearing, AAOx4.   1) CP: Patient is to be discharged with recommendation to follow up with PCP in regards to today's hospital visit. Chest pain is not likely of cardiac or pulmonary etiology d/t presentation, perc negative, VSS, no tracheal deviation, no JVD or new murmur, RRR, breath sounds equal bilaterally, EKG  without acute abnormalities, negative troponin, and negative CXR.   2) Anxiety: Patient presents to the emergency department complaining of symptoms consistent with anxiety.  Patient has a history of same with similar episodes.  The patient is resting comfortably, in no apparent distress and asymptomatic.  Labs, ECG and vital signs reviewed.  No exophthalmos, pregnancy test negative, no signs of UTI.  Stress reducing mechanisms discussed including caffeine intake.  Patient has been referred to psychiatric services for follow-up.    3) Nausea, vomiting, diarrhea:  No focal abdominal pain, no concern for appendicitis, cholecystitis, pancreatitis, ruptured viscus, UTI, kidney stone, or any other abdominal etiology.  Supportive therapy indicated with return if symptoms worsen.    Symptoms managed in ED. Return precautions discussed. Patient is agreeable to plan. Patient is stable at time of discharge    Harlow Mares, PA-C 06/27/14 2000

## 2014-06-28 NOTE — ED Provider Notes (Signed)
Medical screening examination/treatment/procedure(s) were performed by non-physician practitioner and as supervising physician I was immediately available for consultation/collaboration.   Date: 06/28/2014  Rate: 70's  Rhythm: nsr  QRS Axis: borderline rad  Intervals: nml  ST/T Wave abnormalities: none  Conduction Disutrbances:none  Narrative Interpretation:   Old EKG Reviewed: no significant change     Veryl Speak, MD 06/28/14 [phone]

## 2015-09-19 ENCOUNTER — Emergency Department (HOSPITAL_COMMUNITY): Payer: MEDICAID

## 2015-09-19 ENCOUNTER — Encounter (HOSPITAL_COMMUNITY): Payer: Self-pay

## 2015-09-19 ENCOUNTER — Emergency Department (HOSPITAL_COMMUNITY): Payer: Self-pay

## 2015-09-19 ENCOUNTER — Observation Stay (HOSPITAL_COMMUNITY)
Admission: EM | Admit: 2015-09-19 | Discharge: 2015-09-20 | Disposition: A | Discharge status: Home / Self Care | Payer: Self-pay | Attending: Internal Medicine | Admitting: Internal Medicine

## 2015-09-19 DIAGNOSIS — E079 Disorder of thyroid, unspecified: Secondary | ICD-10-CM | POA: Diagnosis present

## 2015-09-19 DIAGNOSIS — D72829 Elevated white blood cell count, unspecified: Secondary | ICD-10-CM | POA: Diagnosis present

## 2015-09-19 DIAGNOSIS — D696 Thrombocytopenia, unspecified: Secondary | ICD-10-CM | POA: Diagnosis present

## 2015-09-19 DIAGNOSIS — R197 Diarrhea, unspecified: Secondary | ICD-10-CM | POA: Diagnosis present

## 2015-09-19 DIAGNOSIS — R109 Unspecified abdominal pain: Secondary | ICD-10-CM | POA: Insufficient documentation

## 2015-09-19 DIAGNOSIS — R112 Nausea with vomiting, unspecified: Principal | ICD-10-CM | POA: Diagnosis present

## 2015-09-19 DIAGNOSIS — R51 Headache: Secondary | ICD-10-CM | POA: Insufficient documentation

## 2015-09-19 DIAGNOSIS — J40 Bronchitis, not specified as acute or chronic: Secondary | ICD-10-CM | POA: Insufficient documentation

## 2015-09-19 DIAGNOSIS — R062 Wheezing: Secondary | ICD-10-CM

## 2015-09-19 DIAGNOSIS — F329 Major depressive disorder, single episode, unspecified: Secondary | ICD-10-CM

## 2015-09-19 DIAGNOSIS — R739 Hyperglycemia, unspecified: Secondary | ICD-10-CM | POA: Diagnosis present

## 2015-09-19 DIAGNOSIS — E669 Obesity, unspecified: Secondary | ICD-10-CM | POA: Diagnosis present

## 2015-09-19 DIAGNOSIS — F32A Depression, unspecified: Secondary | ICD-10-CM

## 2015-09-19 DIAGNOSIS — D649 Anemia, unspecified: Secondary | ICD-10-CM | POA: Insufficient documentation

## 2015-09-19 HISTORY — DX: Fibromyalgia: M79.7

## 2015-09-19 HISTORY — DX: Migraine, unspecified, not intractable, without status migrainosus: G43.909

## 2015-09-19 HISTORY — DX: Basal cell carcinoma of skin of scalp and neck: C44.41

## 2015-09-19 HISTORY — DX: Nausea with vomiting, unspecified: R11.2

## 2015-09-19 HISTORY — DX: Bipolar disorder, unspecified: F31.9

## 2015-09-19 HISTORY — DX: Anemia, unspecified: D64.9

## 2015-09-19 HISTORY — DX: Headache, unspecified: R51.9

## 2015-09-19 HISTORY — DX: Obesity, unspecified: E66.9

## 2015-09-19 HISTORY — DX: Unspecified asthma, uncomplicated: J45.909

## 2015-09-19 HISTORY — DX: Headache: R51

## 2015-09-19 LAB — COMPREHENSIVE METABOLIC PANEL
ALT: 22 U/L (ref 14–54)
ALT: 20 U/L (ref 14–54)
ANION GAP: 8 (ref 5–15)
AST: 22 U/L (ref 15–41)
AST: 19 U/L (ref 15–41)
Albumin: 4 g/dL (ref 3.5–5.0)
Albumin: 3.4 g/dL — ABNORMAL LOW (ref 3.5–5.0)
Alkaline Phosphatase: 112 U/L (ref 38–126)
Alkaline Phosphatase: 94 U/L (ref 38–126)
Anion gap: 10 (ref 5–15)
BUN: 10 mg/dL (ref 6–20)
BUN: 8 mg/dL (ref 6–20)
CALCIUM: 9.1 mg/dL (ref 8.9–10.3)
CHLORIDE: 108 mmol/L (ref 101–111)
CO2: 24 mmol/L (ref 22–32)
CO2: 22 mmol/L (ref 22–32)
Calcium: 9.9 mg/dL (ref 8.9–10.3)
Chloride: 106 mmol/L (ref 101–111)
Creatinine, Ser: 0.71 mg/dL (ref 0.44–1.00)
Creatinine, Ser: 0.77 mg/dL (ref 0.44–1.00)
GFR calc Af Amer: >60 mL/min (ref >60)
GFR calc non Af Amer: >60 mL/min (ref >60)
Glucose, Bld: 144 mg/dL — ABNORMAL HIGH (ref 65–99)
Glucose, Bld: 104 mg/dL — ABNORMAL HIGH (ref 65–99)
Potassium: 4 mmol/L (ref 3.5–5.1)
Potassium: 4.1 mmol/L (ref 3.5–5.1)
SODIUM: 138 mmol/L (ref 135–145)
Sodium: 140 mmol/L (ref 135–145)
Total Bilirubin: 0.3 mg/dL (ref 0.3–1.2)
Total Bilirubin: 0.3 mg/dL (ref 0.3–1.2)
Total Protein: 6.4 g/dL — ABNORMAL LOW (ref 6.5–8.1)
Total Protein: 5.8 g/dL — ABNORMAL LOW (ref 6.5–8.1)

## 2015-09-19 LAB — RAPID URINE DRUG SCREEN, HOSP PERFORMED
Amphetamines: NOT DETECTED
BARBITURATES: NOT DETECTED
Benzodiazepines: NOT DETECTED
Cocaine: NOT DETECTED
Opiates: NOT DETECTED
Tetrahydrocannabinol: POSITIVE — AB

## 2015-09-19 LAB — CBC
HCT: 39.2 % (ref 36.0–46.0)
HCT: 34.6 % — ABNORMAL LOW (ref 36.0–46.0)
Hemoglobin: 13.1 g/dL (ref 12.0–15.0)
Hemoglobin: 11.8 g/dL — ABNORMAL LOW (ref 12.0–15.0)
MCH: 31.2 pg (ref 26.0–34.0)
MCH: 31.6 pg (ref 26.0–34.0)
MCHC: 33.4 g/dL (ref 30.0–36.0)
MCHC: 34.1 g/dL (ref 30.0–36.0)
MCV: 93.3 fL (ref 78.0–100.0)
MCV: 92.5 fL (ref 78.0–100.0)
PLATELETS: 148 10*3/uL — AB (ref 150–400)
PLATELETS: 139 10*3/uL — AB (ref 150–400)
RBC: 4.2 MIL/uL (ref 3.87–5.11)
RBC: 3.74 MIL/uL — ABNORMAL LOW (ref 3.87–5.11)
RDW: 13.3 % (ref 11.5–15.5)
RDW: 13.6 % (ref 11.5–15.5)
WBC: 11.5 10*3/uL — ABNORMAL HIGH (ref 4.0–10.5)
WBC: 9 10*3/uL (ref 4.0–10.5)

## 2015-09-19 LAB — URINALYSIS, ROUTINE W REFLEX MICROSCOPIC
BILIRUBIN URINE: NEGATIVE
Glucose, UA: NEGATIVE mg/dL
Ketones, ur: 15 mg/dL — AB
Leukocytes, UA: NEGATIVE
NITRITE: NEGATIVE
PROTEIN: NEGATIVE mg/dL
SPECIFIC GRAVITY, URINE: 1.01 (ref 1.005–1.030)
UROBILINOGEN UA: 0.2 mg/dL (ref 0.0–1.0)
pH: 5.5 (ref 5.0–8.0)

## 2015-09-19 LAB — URINE MICROSCOPIC-ADD ON

## 2015-09-19 LAB — POC URINE PREG, ED: Preg Test, Ur: NEGATIVE

## 2015-09-19 LAB — INFLUENZA PANEL BY PCR (TYPE A & B)
H1N1FLUPCR: NOT DETECTED
INFLBPCR: NEGATIVE
Influenza A By PCR: NEGATIVE

## 2015-09-19 LAB — LIPASE, BLOOD: Lipase: 20 U/L (ref 11–51)

## 2015-09-19 LAB — TSH: TSH: 0.88 u[IU]/mL (ref 0.350–4.500)

## 2015-09-19 MED ORDER — DIPHENHYDRAMINE HCL 50 MG/ML IJ SOLN
25.0000 mg | Freq: Once | INTRAMUSCULAR | Status: AC
  Administered 2015-09-19: 25 mg via INTRAVENOUS
  Filled 2015-09-19: qty 1

## 2015-09-19 MED ORDER — HYDROCODONE-ACETAMINOPHEN 5-325 MG PO TABS: 1.0000 | ORAL_TABLET | ORAL | Status: DC | PRN

## 2015-09-19 MED ORDER — PROMETHAZINE HCL 25 MG/ML IJ SOLN: 12.5000 mg | INTRAMUSCULAR | Status: DC | PRN

## 2015-09-19 MED ORDER — HYDROCODONE-ACETAMINOPHEN 5-325 MG PO TABS
1.0000 | ORAL_TABLET | ORAL | Status: DC | PRN
  Administered 2015-09-19 – 2015-09-20 (×4): 1 via ORAL
  Filled 2015-09-19 (×4): qty 1

## 2015-09-19 MED ORDER — SODIUM CHLORIDE 0.9 % IV SOLN
INTRAVENOUS | Status: DC
  Administered 2015-09-19 – 2015-09-20 (×2): via INTRAVENOUS

## 2015-09-19 MED ORDER — METHYLPREDNISOLONE SODIUM SUCC 125 MG IJ SOLR
125.0000 mg | Freq: Once | INTRAMUSCULAR | Status: AC
  Administered 2015-09-19: 125 mg via INTRAVENOUS
  Filled 2015-09-19: qty 2

## 2015-09-19 MED ORDER — SODIUM CHLORIDE 0.9 % IV BOLUS (SEPSIS)
2000.0000 mL | Freq: Once | INTRAVENOUS | Status: AC
  Administered 2015-09-19: 2000 mL via INTRAVENOUS

## 2015-09-19 MED ORDER — MORPHINE SULFATE (PF) 2 MG/ML IV SOLN
1.0000 mg | INTRAVENOUS | Status: DC | PRN
  Administered 2015-09-19 – 2015-09-20 (×4): 1 mg via INTRAVENOUS
  Filled 2015-09-19 (×4): qty 1

## 2015-09-19 MED ORDER — SODIUM CHLORIDE 0.9 % IV BOLUS (SEPSIS)
1000.0000 mL | Freq: Once | INTRAVENOUS | Status: AC
  Administered 2015-09-19: 1000 mL via INTRAVENOUS

## 2015-09-19 MED ORDER — ACETAMINOPHEN 650 MG RE SUPP: 650.0000 mg | Freq: Four times a day (QID) | RECTAL | Status: DC | PRN

## 2015-09-19 MED ORDER — ACETAMINOPHEN 325 MG PO TABS: 650.0000 mg | ORAL_TABLET | Freq: Four times a day (QID) | ORAL | Status: DC | PRN

## 2015-09-19 MED ORDER — ENOXAPARIN SODIUM 40 MG/0.4ML ~~LOC~~ SOLN
40.0000 mg | SUBCUTANEOUS | Status: DC
  Administered 2015-09-19: 40 mg via SUBCUTANEOUS
  Filled 2015-09-19: qty 0.4

## 2015-09-19 MED ORDER — PROMETHAZINE HCL 25 MG/ML IJ SOLN
25.0000 mg | Freq: Once | INTRAMUSCULAR | Status: AC
  Administered 2015-09-19: 25 mg via INTRAVENOUS
  Filled 2015-09-19: qty 1

## 2015-09-19 MED ORDER — PANTOPRAZOLE SODIUM 40 MG IV SOLR
40.0000 mg | Freq: Two times a day (BID) | INTRAVENOUS | Status: DC
  Administered 2015-09-19 (×2): 40 mg via INTRAVENOUS
  Filled 2015-09-19 (×2): qty 40

## 2015-09-19 MED ORDER — METOCLOPRAMIDE HCL 5 MG/ML IJ SOLN
10.0000 mg | Freq: Once | INTRAMUSCULAR | Status: AC
  Administered 2015-09-19: 10 mg via INTRAVENOUS
  Filled 2015-09-19: qty 2

## 2015-09-19 MED ORDER — GI COCKTAIL ~~LOC~~
30.0000 mL | Freq: Once | ORAL | Status: AC
  Administered 2015-09-19: 30 mL via ORAL
  Filled 2015-09-19: qty 30

## 2015-09-19 MED ORDER — CLONIDINE HCL 0.2 MG PO TABS
0.2000 mg | ORAL_TABLET | Freq: Once | ORAL | Status: DC
  Administered 2015-09-19: 0.2 mg via ORAL
  Filled 2015-09-19: qty 1

## 2015-09-19 MED ORDER — ONDANSETRON HCL 4 MG/2ML IJ SOLN
4.0000 mg | Freq: Once | INTRAMUSCULAR | Status: AC | PRN
  Administered 2015-09-19: 4 mg via INTRAVENOUS
  Filled 2015-09-19: qty 2

## 2015-09-19 MED ORDER — ONDANSETRON HCL 4 MG/2ML IJ SOLN
4.0000 mg | INTRAMUSCULAR | Status: DC
  Administered 2015-09-19 – 2015-09-20 (×5): 4 mg via INTRAVENOUS
  Filled 2015-09-19 (×5): qty 2

## 2015-09-19 MED ORDER — ONDANSETRON HCL 4 MG/2ML IJ SOLN
4.0000 mg | Freq: Once | INTRAMUSCULAR | Status: AC
  Administered 2015-09-19: 4 mg via INTRAVENOUS
  Filled 2015-09-19: qty 2

## 2015-09-19 MED ORDER — MORPHINE SULFATE (PF) 2 MG/ML IV SOLN: 1.0000 mg | INTRAVENOUS | Status: DC | PRN

## 2015-09-19 NOTE — ED Provider Notes (Signed)
CSN: [id number]   Arrival date & time 09/19/15 0310  History  By signing my name below, I, Altamease Oiler, attest that this documentation has been prepared under the direction and in the presence of Julianne Rice, MD. Electronically Signed: Altamease Oiler, ED Scribe. 09/19/2015. 3:48 AM.  Chief Complaint  Patient presents with  . Emesis    HPI The history is provided by the patient. No language interpreter was used.   Adrienne Garcia is a 49 y.o. female with history of asthma and thyroid disease who presents to the Emergency Department complaining of nausea and vomiting with onset 3 days ago after getting a flu shot. Pt states that she is unable to hold anything down. Associated symptoms include anorexia, chills, sweating, headache, diarrhea, abdominal pain, and cough. She took nothing for the symptoms at home. Pt states that she has never had a similar reaction after the flu shot. Pt denies fever.   Past Medical History  Diagnosis Date  . Thyroid disease   . Depression   . Anxiety   . PTSD (post-traumatic stress disorder)   . Intractable nausea and vomiting   . Obesity   . Asthma   . Anemia   . Daily headache   . Migraine     "probably twice/month" (09/19/2015)  . Fibromyalgia   . Bipolar disorder ([place])   . Basal cell carcinoma of neck     "front of my neck; it was a melanoma"    Past Surgical History  Procedure Laterality Date  . Multiple tooth extractions  ~ 2011  . Mole removal  ~ 2013    "front side of my neck"    History reviewed. No pertinent family history.  Social History  Substance Use Topics  . Smoking status: Former Smoker -- 0.50 packs/day for 28 years    Types: Cigarettes  . Smokeless tobacco: None     Comment: "quit smoking ~ 2014"  . Alcohol Use: No     Review of Systems  Constitutional: Positive for chills and diaphoresis. Negative for fever.  Respiratory: Negative for shortness of breath.   Cardiovascular: Negative for chest pain.   Gastrointestinal: Positive for nausea, vomiting, abdominal pain and diarrhea. Negative for constipation and blood in stool.  Genitourinary: Negative for dysuria, flank pain, enuresis and difficulty urinating.  Musculoskeletal: Negative for back pain, neck pain and neck stiffness.  Skin: Negative for rash and wound.  Neurological: Positive for headaches. Negative for dizziness, syncope, weakness, light-headedness and numbness.  All other systems reviewed and are negative.  Home Medications   Prior to Admission medications   Medication Sig Start Date End Date Taking? Authorizing Provider  albuterol (PROVENTIL HFA;VENTOLIN HFA) 108 (90 BASE) MCG/ACT inhaler Inhale 2 puffs into the lungs every 6 (six) hours as needed for wheezing or shortness of breath. 09/20/15   Geradine Girt, DO  doxycycline (VIBRA-TABS) 100 MG tablet Take 1 tablet (100 mg total) by mouth every 12 (twelve) hours. 09/20/15   Geradine Girt, DO  ondansetron (ZOFRAN) 4 MG tablet Take 1 tablet (4 mg total) by mouth every 8 (eight) hours as needed for nausea or vomiting. 09/20/15   Geradine Girt, DO  predniSONE (DELTASONE) 20 MG tablet Take 2 tablets (40 mg total) by mouth daily with breakfast. 09/20/15   Geradine Girt, DO    Allergies  Ciprofloxacin  Triage Vitals: BP 165/112 mmHg  Pulse 58  Temp(Src) 98.1 F (36.7 C) (Oral)  Resp 20  Ht 5\' 7"  (1.702 m)  Wt 220 lb (99.791 kg)  BMI 34.45 kg/m2  SpO2 99%  LMP 12/16/2011  Physical Exam  Constitutional: She is oriented to person, place, and time. She appears well-developed and well-nourished. No distress.  HENT:  Head: Normocephalic and atraumatic.  Mouth/Throat: Oropharynx is clear and moist. No oropharyngeal exudate.  Eyes: EOM are normal. Pupils are equal, round, and reactive to light.  Neck: Normal range of motion. Neck supple.  No meningismus  Cardiovascular: Normal rate and regular rhythm.   Pulmonary/Chest: Effort normal. No respiratory distress. She has  wheezes (expiratory wheezing throughout). She has no rales.  Abdominal: Soft. Bowel sounds are normal. She exhibits no distension and no mass. There is tenderness (mild diffuse abdominal tenderness without focality, rebound or guarding). There is no rebound and no guarding.  Musculoskeletal: Normal range of motion. She exhibits no edema or tenderness.  No CVA tenderness bilaterally.  Neurological: She is alert and oriented to person, place, and time.  Moving all extremities without deficit. Sensation is fully intact.  Skin: Skin is warm. No rash noted. She is diaphoretic. No erythema.  Gooseflesh noted. Diaphoretic  Psychiatric:  Anxious appearing  Nursing note and vitals reviewed.   ED Course  Procedures   DIAGNOSTIC STUDIES: Oxygen Saturation is 99% on RA, normal by my interpretation.    COORDINATION OF CARE: 3:46 AM Discussed treatment plan which includes lab work and Zofran with pt at bedside and pt agreed to plan.  Labs Reviewed  COMPREHENSIVE METABOLIC PANEL - Abnormal; Notable for the following:    Glucose, Bld 144 (*)    Total Protein 6.4 (*)    All other components within normal limits  CBC - Abnormal; Notable for the following:    WBC 11.5 (*)    Platelets 148 (*)    All other components within normal limits  URINE RAPID DRUG SCREEN, HOSP PERFORMED - Abnormal; Notable for the following:    Tetrahydrocannabinol POSITIVE (*)    All other components within normal limits  CBC - Abnormal; Notable for the following:    RBC 3.42 (*)    Hemoglobin 10.4 (*)    HCT 32.4 (*)    Platelets 115 (*)    All other components within normal limits  CBC - Abnormal; Notable for the following:    RBC 3.74 (*)    Hemoglobin 11.8 (*)    HCT 34.6 (*)    Platelets 139 (*)    All other components within normal limits  COMPREHENSIVE METABOLIC PANEL - Abnormal; Notable for the following:    Glucose, Bld 104 (*)    Total Protein 5.8 (*)    Albumin 3.4 (*)    All other components within  normal limits  URINALYSIS, ROUTINE W REFLEX MICROSCOPIC (NOT AT [facility]) - Abnormal; Notable for the following:    Hgb urine dipstick SMALL (*)    Ketones, ur 15 (*)    All other components within normal limits  MRSA PCR SCREENING  LIPASE, BLOOD  INFLUENZA PANEL BY PCR (TYPE A & B, H1N1)  TSH  BASIC METABOLIC PANEL  URINE MICROSCOPIC-ADD ON  POC URINE PREG, ED    Imaging Review Dg Chest Port 1 View  09/20/2015  CLINICAL DATA:  Wheezing, cough and chest pain for 1 week. EXAM: PORTABLE CHEST 1 VIEW COMPARISON:  09/19/2015. FINDINGS: The cardiac silhouette, mediastinal and hilar contours are within normal limits and stable. The lungs are clear. No pleural effusion. The bony thorax is intact. IMPRESSION: No acute cardiopulmonary findings. Electronically Signed  By: Marijo Sanes M.D.   On: 09/20/2015 12:32   Dg Abd Acute W/chest  09/19/2015  CLINICAL DATA:  Headache, vomiting and mid abdominal pain for 4 days. EXAM: DG ABDOMEN ACUTE W/ 1V CHEST COMPARISON:  Chest radiograph September 27, 2015 FINDINGS: Cardiac silhouette is upper limits of normal in size, mediastinal silhouette is nonsuspicious. No pleural effusion or focal consolidation. No pneumothorax. Soft tissue planes and included osseous structures are nonsuspicious. Paucity of bowel gas. Mild hepatomegaly suspected. No intra-abdominal pathologic calcifications or free air. Mild lumbar levoscoliosis on this nonweightbearing examination. Phleboliths project in the pelvis. IMPRESSION: Borderline cardiomegaly, no acute pulmonary process. Paucity of bowel gas. Mild hepatomegaly suspected. Electronically Signed   By: Elon Alas M.D.   On: 09/19/2015 05:27    I personally reviewed and evaluated these images and lab results as a part of my medical decision-making.   EKG Interpretation  Date/Time:  Thursday September 19 2015 10:46:38 EDT Ventricular Rate:  52 PR Interval:  134 QRS Duration: 108 QT Interval:  470 QTC Calculation: 437 R  Axis:   70 Text Interpretation:  Sinus rhythm ED PHYSICIAN INTERPRETATION AVAILABLE IN [facility] Confirmed by TEST, Record ([id number]) on 09/20/2015 7:22:28 AM    MDM   Final diagnoses:  Thyroid disease  Depression  Wheezing     I, Lori Popowski, personally performed the services described in this documentation. All medical record entries made by the scribe were at my direction and in my presence.  I have reviewed the chart and discharge instructions and agree that the record reflects my personal performance and is accurate and complete. Ekaterini Capitano.  09/21/2015. 5:19 AM.   Patient with persistent vomiting despite multiple antiemetics. Signed out to oncoming emergency physician pending reassessment. Patient will need to be admitted if vomiting is intractable.   Julianne Rice, MD 09/21/15 [phone]

## 2015-09-19 NOTE — ED Provider Notes (Signed)
Patient was signed out by Dr. Lita Mains. I reassessed the patient and she continues to have active nausea and vomiting after treatment with Reglan, Benadryl, Zofran 2 and Phenergan. She has ill appearance. She has diffuse lower abdominal discomfort. Palpation of the abdomen is soft without localizing guarding. At this time she does not have suggestion of surgical abdomen. Patient will be admitted for intractable vomiting with diarrhea, fever and vomiting at onset of illness.  Charlesetta Shanks, MD 09/19/15 [phone]

## 2015-09-19 NOTE — ED Notes (Signed)
While attempting to hook pt up to IV meds and fluids pt pulled only IV access out; IV team consult placed as prior IV could only be gotten using ultrasound

## 2015-09-19 NOTE — H&P (Signed)
[facility] History and Physical  Rosemond Lyttle VPX:[id number] DOB: Jul 08, 1966 DOA: 09/19/2015  Referring physician: Colvin Caroli PCP: Odette Fraction, MD   Chief Complaint: intractable nausea and vomiting persistent diarrhea  HPI: Adrienne Garcia is a 49 y.o. female with a past medical history that includes, thyroid disease, anxiety/depression, obesity presents to the emergency department with the chief complaint of persistent/intractable nausea and vomiting onset 3 days prior after getting the flu shot. She stated emergency department for 6 hours receiving Phenergan and Zofran vigorous IV fluids, Reglan and continued with intractable nausea and vomiting. Referred for observation admission Patient reports 3 days ago getting the flu shot. Shortly thereafter she developed nausea vomiting. This persisted for 3 days. Associated symptoms include headache nasal congestion sore throat diarrhea. She reports multiple loose stools per day for the previous 3 days. In addition she's had 2 episodes while in the emergency department. She denies any recent antibiotics. She denies chest pain palpitations cough or shortness of breath diaphoresis. She denies any recent fever chills or sick contacts. Workup in the emergency department includes complete blood count significant for WBCs 11.5 platelets 148, comprehensive metabolic panel revealed a serum glucose 144 total protein 6.4, lipase within the limits of normal. acute abdominal x-ray with chest reveals borderline cardiomegaly no acute pulmonary process and paucity of bowel process with mild hepatomegaly suspected.  In the emergency department she is afebrile and initially hypertensive and not hypoxic. She is provided with 3 L of normal saline as well as 125 mg of Solu-Medrol, 10 mg of Reglan, total of 8 mg of Zofran, 25 mg of Phenergan.   Review of Systems:  10 point review of systems complete and all systems are negative except in history of present  illness Past Medical History  Diagnosis Date  . Thyroid disease   . Depression   . Anxiety   . PTSD (post-traumatic stress disorder)   . Asthma   . Intractable nausea and vomiting   . Obesity    History reviewed. No pertinent past surgical history. Social History:  reports that she has never smoked. She does not have any smokeless tobacco history on file. She reports that she uses illicit drugs (Marijuana). She reports that she does not drink alcohol.  Allergies  Allergen Reactions  . Ciprofloxacin Hives    No family history on file. mother died in her 45s from "heart attack" father deceased in his 62s she doesn't know anything about his medical history. She has 4 siblings are collective medical history positive for diabetes and hypertension  Prior to Admission medications   Medication Sig Start Date End Date Taking? Authorizing Provider  ondansetron (ZOFRAN) 4 MG tablet Take 1 tablet (4 mg total) by mouth every 8 (eight) hours as needed for nausea or vomiting. 06/27/14   Baron Sane, PA-C   Physical Exam: Filed Vitals:   09/19/15 0619 09/19/15 0630 09/19/15 0700 09/19/15 0719  BP: 111/57 126/60 124/57   Pulse:  67 62   Temp:      TempSrc:      Resp:      Height:      Weight:      SpO2:  97% 97% 95%    Wt Readings from Last 3 Encounters:  09/19/15 99.791 kg (220 lb)    General:  Appears calm slightly unwell but not toxic, irritable Eyes: PERRL, normal lids, irises & conjunctiva ENT: grossly normal hearing, his membranes of her mouth are pink and dry Neck: no LAD, masses or thyromegaly Cardiovascular: RRR,  no m/r/g. No LE edema.  Respiratory: CTA bilaterally, no w/r/r. Normal respiratory effort. Abdomen: Obese, nondistended, soft, sluggish bowel sounds with mild diffuse tenderness to palpation no guarding no rebounding Skin: no rash or induration seen on limited exam Musculoskeletal: grossly normal tone BUE/BLE Psychiatric: grossly normal mood and affect,  speech fluent and appropriate Neurologic: grossly non-focal. Each clear facial symmetry           Labs on Admission:  Basic Metabolic Panel:  Recent Labs Lab 09/19/15 0343  NA 140  K 4.0  CL 106  CO2 24  GLUCOSE 144*  BUN 10  CREATININE 0.71  CALCIUM 9.9   Liver Function Tests:  Recent Labs Lab 09/19/15 0343  AST 22  ALT 22  ALKPHOS 112  BILITOT 0.3  PROT 6.4*  ALBUMIN 4.0    Recent Labs Lab 09/19/15 0343  LIPASE 20   No results for input(s): AMMONIA in the last 168 hours. CBC:  Recent Labs Lab 09/19/15 0343  WBC 11.5*  HGB 13.1  HCT 39.2  MCV 93.3  PLT 148*   Cardiac Enzymes: No results for input(s): CKTOTAL, CKMB, CKMBINDEX, TROPONINI in the last 168 hours.  BNP (last 3 results) No results for input(s): BNP in the last 8760 hours.  ProBNP (last 3 results) No results for input(s): PROBNP in the last 8760 hours.  CBG: No results for input(s): GLUCAP in the last 168 hours.  Radiological Exams on Admission: Dg Abd Acute W/chest  09/19/2015  CLINICAL DATA:  Headache, vomiting and mid abdominal pain for 4 days. EXAM: DG ABDOMEN ACUTE W/ 1V CHEST COMPARISON:  Chest radiograph September 27, 2015 FINDINGS: Cardiac silhouette is upper limits of normal in size, mediastinal silhouette is nonsuspicious. No pleural effusion or focal consolidation. No pneumothorax. Soft tissue planes and included osseous structures are nonsuspicious. Paucity of bowel gas. Mild hepatomegaly suspected. No intra-abdominal pathologic calcifications or free air. Mild lumbar levoscoliosis on this nonweightbearing examination. Phleboliths project in the pelvis. IMPRESSION: Borderline cardiomegaly, no acute pulmonary process. Paucity of bowel gas. Mild hepatomegaly suspected. Electronically Signed   By: Elon Alas M.D.   On: 09/19/2015 05:27    EKG  Assessment/Plan Principal Problem:   Intractable nausea and vomiting Active Problems:   Hyperglycemia   Leukocytosis    Thrombocytopenia (H[facility])   Diarrhea   Obesity   Thyroid disease  #1. Intractable nausea vomiting. Etiology unclear but likely gastroenteritis versus reaction to flu shot. Will admit for observation. Will continue supportive therapy in the form of vigorous IV fluids, scheduled Zofran with when necessary Phenergan for breakthrough. Provide bowel rest as well as with the exception of a few ice chips. Will advance her diet as tolerated. Electrolytes taken at time of presentation without drainage mid. Will recheck. Monitor  #2. Diarrhea. Reports several episodes of diarrhea over the last 3 days. 2 episodes while in the emergency department. Will send stool for GI pathogen and C. difficile. She denies any recent antibiotics. Continue IV fluids. Abdominal x-ray as noted above. All sounds somewhat sluggish on exam  #3. Thrombocytopenia. Mild. Likely related to acute illness. However abdominal x-ray with possible mild hepatomegaly also likely related to acute illness. No signs symptoms of bleeding. Will monitor  #4 leukocytosis. Mild. Patient afebrile nontoxic appearing. Will obtain urinalysis. Most likely related to #1. Monitor  #5. Reports history of thyroid disease. Unsure of specifics. Will check her TSH for thoroughness. Claims no medications.  6. obesity. BMI greater than 30. Consider nutritional consult or outpatient follow-up.  #  7. Hyperglycemia: mild. Denies hx DM. Reports family hx of DM. Will obtain A1c for thoroughness.   Code Status: full DVT Prophylaxis: Family Communication: boyfriend Paul by phone Disposition Plan: home hopefully tomorrow  Time spent: 61 minutes  [facility]

## 2015-09-19 NOTE — Progress Notes (Signed)
Called ED RN ([place]) for report. She needed to call back due to a patient just arriving. Awaiting report.

## 2015-09-19 NOTE — ED Notes (Signed)
Pt states she got flu shot on this past Saturday and she started vomiting and has not stopped since; Pt states she has had night sweats and fever; Pt states she has had severe vomiting for 3 days; pt states 10/10 pain body aches; pt a&ox 4 on arrival

## 2015-09-19 NOTE — ED Notes (Signed)
This RN made admitting MD aware of new CP.  This RN received verbal order for GI cocktail and was told MD will review EKG.

## 2015-09-19 NOTE — ED Notes (Signed)
Unable to obtain urine sample at the moment due to pt having diarrhea runs while on bedside commode. Notified pt of need for urine.

## 2015-09-19 NOTE — Progress Notes (Signed)
Pt arrived to 2W from ER via stretcher accompanied by RN.  Pt with IV infusing NS @ 125 ml/hr.  Pt connected to telemetry and CCMD notified.  Pt oriented to room, including call light and telephone.  Pt on precautions at this time d/t diarrhea and vomiting.  Pt c/o pain 8/10 in abdominal and chest area including a headache.  No IV pain medicine received in ER, new orders for morphine rcvd and will be given.  Pt continues with bradycardia, CCMD reports HR of 50.  Will continue to monitor.

## 2015-09-19 NOTE — ED Notes (Signed)
THis RN spoke with Lurlean Leyden, NP, to request increased level of care from med surg. And to advise CP unchanged after GI cocktail.

## 2015-09-19 NOTE — Progress Notes (Signed)
Addendum:   Notified of patient complaint of chest pain. Likely GI in relation to intractable nausea/vomiting. EKG reviewed revealing NSR. Will provide GI cocktail.       Santiago Glad m. Black, NP  Agree with above.    Debbe Odea, MD

## 2015-09-19 NOTE — ED Notes (Signed)
Pt called out for pain meds.  This RN spoke with Santiago Glad, NP, who stated she would put in an order.

## 2015-09-19 NOTE — ED Notes (Signed)
Patient transported to X-ray 

## 2015-09-20 ENCOUNTER — Observation Stay (HOSPITAL_COMMUNITY): Payer: Self-pay

## 2015-09-20 DIAGNOSIS — R197 Diarrhea, unspecified: Secondary | ICD-10-CM

## 2015-09-20 DIAGNOSIS — R1110 Vomiting, unspecified: Secondary | ICD-10-CM

## 2015-09-20 DIAGNOSIS — J209 Acute bronchitis, unspecified: Secondary | ICD-10-CM

## 2015-09-20 DIAGNOSIS — D72829 Elevated white blood cell count, unspecified: Secondary | ICD-10-CM

## 2015-09-20 LAB — BASIC METABOLIC PANEL
Anion gap: 7 (ref 5–15)
BUN: 10 mg/dL (ref 6–20)
CHLORIDE: 109 mmol/L (ref 101–111)
CO2: 25 mmol/L (ref 22–32)
CREATININE: 0.75 mg/dL (ref 0.44–1.00)
Calcium: 8.9 mg/dL (ref 8.9–10.3)
Glucose, Bld: 93 mg/dL (ref 65–99)
POTASSIUM: 3.7 mmol/L (ref 3.5–5.1)
SODIUM: 141 mmol/L (ref 135–145)

## 2015-09-20 LAB — CBC
HCT: 32.4 % — ABNORMAL LOW (ref 36.0–46.0)
HEMOGLOBIN: 10.4 g/dL — AB (ref 12.0–15.0)
MCH: 30.4 pg (ref 26.0–34.0)
MCHC: 32.1 g/dL (ref 30.0–36.0)
MCV: 94.7 fL (ref 78.0–100.0)
PLATELETS: 115 10*3/uL — AB (ref 150–400)
RBC: 3.42 MIL/uL — AB (ref 3.87–5.11)
RDW: 13.9 % (ref 11.5–15.5)
WBC: 6.9 10*3/uL (ref 4.0–10.5)

## 2015-09-20 LAB — MRSA PCR SCREENING: MRSA BY PCR: NEGATIVE

## 2015-09-20 MED ORDER — PREDNISONE 20 MG PO TABS
40.0000 mg | ORAL_TABLET | Freq: Every day | ORAL | Status: DC
Start: 2015-09-20 — End: 2015-09-20

## 2015-09-20 MED ORDER — DOXYCYCLINE HYCLATE 100 MG PO TABS
100.0000 mg | ORAL_TABLET | Freq: Two times a day (BID) | ORAL | Status: DC
  Administered 2015-09-20: 100 mg via ORAL
  Filled 2015-09-20: qty 1

## 2015-09-20 MED ORDER — ALBUTEROL SULFATE HFA 108 (90 BASE) MCG/ACT IN AERS: 2.0000 | INHALATION_SPRAY | Freq: Four times a day (QID) | RESPIRATORY_TRACT | Status: DC | PRN

## 2015-09-20 MED ORDER — PREDNISONE 20 MG PO TABS
40.0000 mg | ORAL_TABLET | Freq: Every day | ORAL | Status: DC
  Administered 2015-09-20: 40 mg via ORAL
  Filled 2015-09-20: qty 2

## 2015-09-20 MED ORDER — PANTOPRAZOLE SODIUM 40 MG PO TBEC
40.0000 mg | DELAYED_RELEASE_TABLET | Freq: Two times a day (BID) | ORAL | Status: DC
  Administered 2015-09-20: 40 mg via ORAL
  Filled 2015-09-20: qty 1

## 2015-09-20 MED ORDER — DOXYCYCLINE HYCLATE 100 MG PO TABS: 100.0000 mg | ORAL_TABLET | Freq: Two times a day (BID) | ORAL | Status: DC

## 2015-09-20 MED ORDER — ONDANSETRON HCL 4 MG PO TABS: 4.0000 mg | ORAL_TABLET | Freq: Three times a day (TID) | ORAL | Status: DC | PRN

## 2015-09-20 MED ORDER — FUROSEMIDE 10 MG/ML IJ SOLN
20.0000 mg | Freq: Once | INTRAMUSCULAR | Status: AC
  Administered 2015-09-20: 20 mg via INTRAVENOUS
  Filled 2015-09-20: qty 2

## 2015-09-20 MED ORDER — FUROSEMIDE 10 MG/ML IJ SOLN
40.0000 mg | Freq: Once | INTRAMUSCULAR | Status: DC
  Filled 2015-09-20: qty 4

## 2015-09-20 MED ORDER — ALBUTEROL SULFATE (2.5 MG/3ML) 0.083% IN NEBU
2.5000 mg | INHALATION_SOLUTION | RESPIRATORY_TRACT | Status: DC | PRN
  Administered 2015-09-20: 2.5 mg via RESPIRATORY_TRACT
  Filled 2015-09-20: qty 3

## 2015-09-20 MED ORDER — PREDNISONE 20 MG PO TABS
40.0000 mg | ORAL_TABLET | Freq: Every day | ORAL | Status: DC
Start: 2015-09-20 — End: 2016-01-27

## 2015-09-20 NOTE — Progress Notes (Signed)
Patient being discharged home. IV removed, intact. Patient educated on prescriptions and discharge instructions. Kathryne Sharper, RN

## 2015-09-20 NOTE — Care Management Note (Signed)
Case Management Note Marvetta Gibbons RN, BSN Unit 2W-Case Manager [phone]  Patient Details  Name: Weston Fulco MRN: [id number] Date of Birth: 1966-09-14  Subjective/Objective:     Pt admitted with abd pain- N/V- dehydration               Action/Plan: PTA  Pt lived at home- referral received for PCP needs- spoke with pt at bedside per conversation pt reports that prior to loosing her insurance she had a PCP- Dr. Dennard Schaumann at [facility]- but has not been able to see him since loosing her insurance- she is agreeable and would like to be established at the [facility]- call made to clinic and f/u appointment made for Oct. 28 at 2:30- info on clinic given to pt along with appointment time.   Expected Discharge Date:     09/21/15             Expected Discharge Plan:  Home/Self Care  In-House Referral:     Discharge planning Services  CM Consult, Follow-up appt scheduled, [facility]  Post Acute Care Choice:    Choice offered to:     DME Arranged:    DME Agency:     HH Arranged:    HH Agency:     Status of Service:  Completed, signed off  Medicare Important Message Given:    Date Medicare IM Given:    Medicare IM give by:    Date Additional Medicare IM Given:    Additional Medicare Important Message give by:     If discussed at [place] of Stay Meetings, dates discussed:    Additional Comments:  Dawayne Patricia, RN 09/20/2015, 11:51 AM

## 2015-09-20 NOTE — Discharge Summary (Signed)
Physician Discharge Summary  Adrienne Garcia [id number] DOB: 05-14-1966 DOA: 09/19/2015  PCP: No PCP Per Patient  Admit date: 09/19/2015 Discharge date: 09/20/2015  Time spent: 35 minutes  Recommendations for Outpatient Follow-up:  1. CBC 1 week-- arrangements have been made to follow up in clinic  Discharge Diagnoses:  Principal Problem:   Intractable nausea and vomiting Active Problems:   Hyperglycemia   Leukocytosis   Thrombocytopenia ([place])   Diarrhea   Obesity   Thyroid disease   Discharge Condition: improved  Diet recommendation: regular  Filed Weights   09/19/15 0315 09/19/15 0324 09/19/15 1738  Weight: 99.791 kg (220 lb) 99.791 kg (220 lb) 106.369 kg (234 lb 8 oz)    History of present illness:  Adrienne Garcia is a 49 y.o. female with a past medical history that includes, thyroid disease, anxiety/depression, obesity presents to the emergency department with the chief complaint of persistent/intractable nausea and vomiting onset 3 days prior after getting the flu shot. She stated emergency department for 6 hours receiving Phenergan and Zofran vigorous IV fluids, Reglan and continued with intractable nausea and vomiting. Referred for observation admission Patient reports 3 days ago getting the flu shot. Shortly thereafter she developed nausea vomiting. This persisted for 3 days. Associated symptoms include headache nasal congestion sore throat diarrhea. She reports multiple loose stools per day for the previous 3 days. In addition she's had 2 episodes while in the emergency department. She denies any recent antibiotics. She denies chest pain palpitations cough or shortness of breath diaphoresis. She denies any recent fever chills or sick contacts. Workup in the emergency department includes complete blood count significant for WBCs 11.5 platelets 148, comprehensive metabolic panel revealed a serum glucose 144 total protein 6.4, lipase within the limits of normal. acute  abdominal x-ray with chest reveals borderline cardiomegaly no acute pulmonary process and paucity of bowel process with mild hepatomegaly suspected.  In the emergency department she is afebrile and initially hypertensive and not hypoxic. She is provided with 3 L of normal saline as well as 125 mg of Solu-Medrol, 10 mg of Reglan, total of 8 mg of Zofran, 25 mg of Phenergan  Hospital Course:  Bronchitis -steroids -abx -albuterol  N/V/D -resolved -suspect viral in nature -was given lasix x 1 as patient got 4.2L of O2-- wheezing resolved -eating well- wants to go home  Anemia -suspect dilutional from IVF in ER -denies bleeding -CBC 1 week  Procedures:    Consultations:    Discharge Exam: Filed Vitals:   09/20/15 1435  BP: 114/59  Pulse: 50  Temp: 98.6 F (37 C)  Resp: 20    General: awake, NAD Cardiovascular: rrr Respiratory: wheezing resolved-- few course breath sounds  Discharge Instructions   Discharge Instructions    Diet general    Complete by:  As directed      Discharge instructions    Complete by:  As directed   While on abx can take OTC lactobacillus (can find in yogurt as well) to help with any diarrhea or yeast infections     Increase activity slowly    Complete by:  As directed           Current Discharge Medication List    START taking these medications   Details  albuterol (PROVENTIL HFA;VENTOLIN HFA) 108 (90 BASE) MCG/ACT inhaler Inhale 2 puffs into the lungs every 6 (six) hours as needed for wheezing or shortness of breath. Qty: 1 Inhaler, Refills: 2    doxycycline (VIBRA-TABS) 100 MG tablet  Take 1 tablet (100 mg total) by mouth every 12 (twelve) hours. Qty: 10 tablet, Refills: 0    predniSONE (DELTASONE) 20 MG tablet Take 2 tablets (40 mg total) by mouth daily with breakfast. Qty: 5 tablet, Refills: 0      CONTINUE these medications which have CHANGED   Details  ondansetron (ZOFRAN) 4 MG tablet Take 1 tablet (4 mg total) by mouth  every 8 (eight) hours as needed for nausea or vomiting. Qty: 10 tablet, Refills: 0      STOP taking these medications     ibuprofen (ADVIL,MOTRIN) 200 MG tablet        Allergies  Allergen Reactions  . Ciprofloxacin Hives   Follow-up Information    Follow up with [facility] On 09/27/2015.   Why:  f/u appointment 2:30    Contact information:   [address] [place] [address] [phone]       The results of significant diagnostics from this hospitalization (including imaging, microbiology, ancillary and laboratory) are listed below for reference.    Significant Diagnostic Studies: Dg Chest Port 1 View  09/20/2015  CLINICAL DATA:  Wheezing, cough and chest pain for 1 week. EXAM: PORTABLE CHEST 1 VIEW COMPARISON:  09/19/2015. FINDINGS: The cardiac silhouette, mediastinal and hilar contours are within normal limits and stable. The lungs are clear. No pleural effusion. The bony thorax is intact. IMPRESSION: No acute cardiopulmonary findings. Electronically Signed   By: Marijo Sanes M.D.   On: 09/20/2015 12:32   Dg Abd Acute W/chest  09/19/2015  CLINICAL DATA:  Headache, vomiting and mid abdominal pain for 4 days. EXAM: DG ABDOMEN ACUTE W/ 1V CHEST COMPARISON:  Chest radiograph September 27, 2015 FINDINGS: Cardiac silhouette is upper limits of normal in size, mediastinal silhouette is nonsuspicious. No pleural effusion or focal consolidation. No pneumothorax. Soft tissue planes and included osseous structures are nonsuspicious. Paucity of bowel gas. Mild hepatomegaly suspected. No intra-abdominal pathologic calcifications or free air. Mild lumbar levoscoliosis on this nonweightbearing examination. Phleboliths project in the pelvis. IMPRESSION: Borderline cardiomegaly, no acute pulmonary process. Paucity of bowel gas. Mild hepatomegaly suspected. Electronically Signed   By: Elon Alas M.D.   On: 09/19/2015 05:27     Microbiology: Recent Results (from the past 240 hour(s))  MRSA PCR Screening     Status: None   Collection Time: 09/20/15 12:04 AM  Result Value Ref Range Status   MRSA by PCR NEGATIVE NEGATIVE Final    Comment:        The GeneXpert MRSA Assay (FDA approved for NASAL specimens only), is one component of a comprehensive MRSA colonization surveillance program. It is not intended to diagnose MRSA infection nor to guide or monitor treatment for MRSA infections.      Labs: Basic Metabolic Panel:  Recent Labs Lab 09/19/15 0343 09/19/15 1945 09/20/15 0224  NA 140 138 141  K 4.0 4.1 3.7  CL 106 108 109  CO2 24 22 25   GLUCOSE 144* 104* 93  BUN 10 8 10   CREATININE 0.71 0.77 0.75  CALCIUM 9.9 9.1 8.9   Liver Function Tests:  Recent Labs Lab 09/19/15 0343 09/19/15 1945  AST 22 19  ALT 22 20  ALKPHOS 112 94  BILITOT 0.3 0.3  PROT 6.4* 5.8*  ALBUMIN 4.0 3.4*    Recent Labs Lab 09/19/15 0343  LIPASE 20   No results for input(s): AMMONIA in the last 168 hours. CBC:  Recent Labs Lab 09/19/15 0343 09/19/15  1945 09/20/15 0224  WBC 11.5* 9.0 6.9  HGB 13.1 11.8* 10.4*  HCT 39.2 34.6* 32.4*  MCV 93.3 92.5 94.7  PLT 148* 139* 115*   Cardiac Enzymes: No results for input(s): CKTOTAL, CKMB, CKMBINDEX, TROPONINI in the last 168 hours. BNP: BNP (last 3 results) No results for input(s): BNP in the last 8760 hours.  ProBNP (last 3 results) No results for input(s): PROBNP in the last 8760 hours.  CBG: No results for input(s): GLUCAP in the last 168 hours.     SignedEulogio Bear  [facility] 09/20/2015, 3:40 PM

## 2015-09-27 ENCOUNTER — Inpatient Hospital Stay: Payer: Self-pay | Admitting: Family Medicine

## 2016-01-22 ENCOUNTER — Encounter (HOSPITAL_COMMUNITY): Payer: Self-pay | Admitting: Cardiology

## 2016-01-22 ENCOUNTER — Emergency Department (HOSPITAL_COMMUNITY)
Admission: EM | Admit: 2016-01-22 | Discharge: 2016-01-23 | Disposition: A | Discharge status: Home / Self Care | Payer: MEDICAID | Attending: Emergency Medicine | Admitting: Emergency Medicine

## 2016-01-22 ENCOUNTER — Emergency Department (HOSPITAL_COMMUNITY): Payer: MEDICAID

## 2016-01-22 DIAGNOSIS — R112 Nausea with vomiting, unspecified: Secondary | ICD-10-CM | POA: Insufficient documentation

## 2016-01-22 DIAGNOSIS — E669 Obesity, unspecified: Secondary | ICD-10-CM | POA: Insufficient documentation

## 2016-01-22 DIAGNOSIS — J45901 Unspecified asthma with (acute) exacerbation: Secondary | ICD-10-CM | POA: Insufficient documentation

## 2016-01-22 DIAGNOSIS — R079 Chest pain, unspecified: Secondary | ICD-10-CM | POA: Insufficient documentation

## 2016-01-22 LAB — I-STAT TROPONIN, ED: Troponin i, poc: 0 ng/mL (ref 0.00–0.08)

## 2016-01-22 LAB — CBC
HEMATOCRIT: 41.4 % (ref 36.0–46.0)
HEMOGLOBIN: 13.6 g/dL (ref 12.0–15.0)
MCH: 30.6 pg (ref 26.0–34.0)
MCHC: 32.9 g/dL (ref 30.0–36.0)
MCV: 93 fL (ref 78.0–100.0)
Platelets: 132 10*3/uL — ABNORMAL LOW (ref 150–400)
RBC: 4.45 MIL/uL (ref 3.87–5.11)
RDW: 12.9 % (ref 11.5–15.5)
WBC: 6.2 10*3/uL (ref 4.0–10.5)

## 2016-01-22 LAB — BASIC METABOLIC PANEL
ANION GAP: 13 (ref 5–15)
BUN: 9 mg/dL (ref 6–20)
CO2: 22 mmol/L (ref 22–32)
Calcium: 9.9 mg/dL (ref 8.9–10.3)
Chloride: 106 mmol/L (ref 101–111)
Creatinine, Ser: 0.66 mg/dL (ref 0.44–1.00)
GFR calc Af Amer: >60 mL/min (ref >60)
Glucose, Bld: 123 mg/dL — ABNORMAL HIGH (ref 65–99)
POTASSIUM: 4 mmol/L (ref 3.5–5.1)
SODIUM: 141 mmol/L (ref 135–145)

## 2016-01-22 NOTE — ED Notes (Signed)
Pt reports chest pain and nausea/vomiting. States she has been feeling bad for a couple of days.Also reports SOB.

## 2016-01-26 ENCOUNTER — Encounter (HOSPITAL_COMMUNITY): Payer: Self-pay | Admitting: *Deleted

## 2016-01-26 ENCOUNTER — Inpatient Hospital Stay (HOSPITAL_COMMUNITY)
Admission: EM | Admit: 2016-01-26 | Discharge: 2016-01-27 | DRG: 194 | Discharge status: Against Medical Advice | Payer: Self-pay | Attending: Internal Medicine | Admitting: Internal Medicine

## 2016-01-26 ENCOUNTER — Emergency Department (HOSPITAL_COMMUNITY): Payer: Self-pay

## 2016-01-26 DIAGNOSIS — D649 Anemia, unspecified: Secondary | ICD-10-CM | POA: Diagnosis present

## 2016-01-26 DIAGNOSIS — D696 Thrombocytopenia, unspecified: Secondary | ICD-10-CM | POA: Diagnosis present

## 2016-01-26 DIAGNOSIS — F319 Bipolar disorder, unspecified: Secondary | ICD-10-CM | POA: Diagnosis present

## 2016-01-26 DIAGNOSIS — J101 Influenza due to other identified influenza virus with other respiratory manifestations: Principal | ICD-10-CM | POA: Diagnosis present

## 2016-01-26 DIAGNOSIS — M940 Chondrocostal junction syndrome [Tietze]: Secondary | ICD-10-CM | POA: Insufficient documentation

## 2016-01-26 DIAGNOSIS — Z6836 Body mass index (BMI) 36.0-36.9, adult: Secondary | ICD-10-CM

## 2016-01-26 DIAGNOSIS — F32A Depression, unspecified: Secondary | ICD-10-CM | POA: Diagnosis present

## 2016-01-26 DIAGNOSIS — F1290 Cannabis use, unspecified, uncomplicated: Secondary | ICD-10-CM | POA: Diagnosis present

## 2016-01-26 DIAGNOSIS — M797 Fibromyalgia: Secondary | ICD-10-CM | POA: Diagnosis present

## 2016-01-26 DIAGNOSIS — R112 Nausea with vomiting, unspecified: Secondary | ICD-10-CM | POA: Diagnosis present

## 2016-01-26 DIAGNOSIS — E669 Obesity, unspecified: Secondary | ICD-10-CM | POA: Diagnosis present

## 2016-01-26 DIAGNOSIS — F329 Major depressive disorder, single episode, unspecified: Secondary | ICD-10-CM | POA: Diagnosis present

## 2016-01-26 DIAGNOSIS — Z87891 Personal history of nicotine dependence: Secondary | ICD-10-CM

## 2016-01-26 DIAGNOSIS — Z8249 Family history of ischemic heart disease and other diseases of the circulatory system: Secondary | ICD-10-CM

## 2016-01-26 DIAGNOSIS — R0789 Other chest pain: Secondary | ICD-10-CM

## 2016-01-26 DIAGNOSIS — F4310 Post-traumatic stress disorder, unspecified: Secondary | ICD-10-CM | POA: Diagnosis present

## 2016-01-26 DIAGNOSIS — F419 Anxiety disorder, unspecified: Secondary | ICD-10-CM | POA: Diagnosis present

## 2016-01-26 DIAGNOSIS — J45901 Unspecified asthma with (acute) exacerbation: Secondary | ICD-10-CM | POA: Diagnosis present

## 2016-01-26 DIAGNOSIS — J209 Acute bronchitis, unspecified: Secondary | ICD-10-CM | POA: Insufficient documentation

## 2016-01-26 DIAGNOSIS — E860 Dehydration: Secondary | ICD-10-CM | POA: Diagnosis present

## 2016-01-26 LAB — CBC
HCT: 35.3 % — ABNORMAL LOW (ref 36.0–46.0)
HEMATOCRIT: 40.2 % (ref 36.0–46.0)
Hemoglobin: 13.3 g/dL (ref 12.0–15.0)
Hemoglobin: 12 g/dL (ref 12.0–15.0)
MCH: 30.2 pg (ref 26.0–34.0)
MCH: 31.2 pg (ref 26.0–34.0)
MCHC: 33.1 g/dL (ref 30.0–36.0)
MCHC: 34 g/dL (ref 30.0–36.0)
MCV: 91.4 fL (ref 78.0–100.0)
MCV: 91.7 fL (ref 78.0–100.0)
PLATELETS: 123 10*3/uL — AB (ref 150–400)
Platelets: 109 10*3/uL — ABNORMAL LOW (ref 150–400)
RBC: 4.4 MIL/uL (ref 3.87–5.11)
RBC: 3.85 MIL/uL — ABNORMAL LOW (ref 3.87–5.11)
RDW: 12.8 % (ref 11.5–15.5)
RDW: 12.9 % (ref 11.5–15.5)
WBC: 4.8 10*3/uL (ref 4.0–10.5)
WBC: 5.2 10*3/uL (ref 4.0–10.5)

## 2016-01-26 LAB — URINE MICROSCOPIC-ADD ON

## 2016-01-26 LAB — CREATININE, SERUM
Creatinine, Ser: 0.79 mg/dL (ref 0.44–1.00)
GFR calc Af Amer: >60 mL/min
GFR calc non Af Amer: >60 mL/min

## 2016-01-26 LAB — URINALYSIS, ROUTINE W REFLEX MICROSCOPIC
Glucose, UA: NEGATIVE mg/dL
Ketones, ur: 15 mg/dL — AB
Leukocytes, UA: NEGATIVE
Nitrite: NEGATIVE
Protein, ur: 30 mg/dL — AB
Specific Gravity, Urine: 1.029 (ref 1.005–1.030)
pH: 5.5 (ref 5.0–8.0)

## 2016-01-26 LAB — MAGNESIUM: Magnesium: 1.6 mg/dL — ABNORMAL LOW (ref 1.7–2.4)

## 2016-01-26 LAB — I-STAT TROPONIN, ED: Troponin i, poc: 0 ng/mL (ref 0.00–0.08)

## 2016-01-26 LAB — TROPONIN I
Troponin I: <0.03 ng/mL
Troponin I: <0.03 ng/mL
Troponin I: <0.03 ng/mL (ref <0.031)

## 2016-01-26 LAB — TSH: TSH: 0.47 u[IU]/mL (ref 0.350–4.500)

## 2016-01-26 LAB — BASIC METABOLIC PANEL
Anion gap: 12 (ref 5–15)
BUN: 10 mg/dL (ref 6–20)
CHLORIDE: 107 mmol/L (ref 101–111)
CO2: 22 mmol/L (ref 22–32)
CREATININE: 0.65 mg/dL (ref 0.44–1.00)
Calcium: 9.3 mg/dL (ref 8.9–10.3)
GFR calc Af Amer: >60 mL/min (ref >60)
GFR calc non Af Amer: >60 mL/min (ref >60)
GLUCOSE: 113 mg/dL — AB (ref 65–99)
POTASSIUM: 3.9 mmol/L (ref 3.5–5.1)
SODIUM: 141 mmol/L (ref 135–145)

## 2016-01-26 LAB — MRSA PCR SCREENING: MRSA by PCR: NEGATIVE

## 2016-01-26 LAB — INFLUENZA PANEL BY PCR (TYPE A & B)
H1N1 flu by pcr: NOT DETECTED
Influenza A By PCR: NEGATIVE
Influenza B By PCR: POSITIVE — AB

## 2016-01-26 MED ORDER — IPRATROPIUM BROMIDE 0.02 % IN SOLN
0.5000 mg | Freq: Once | RESPIRATORY_TRACT | Status: AC
  Administered 2016-01-26: 0.5 mg via RESPIRATORY_TRACT
  Filled 2016-01-26: qty 2.5

## 2016-01-26 MED ORDER — KETOROLAC TROMETHAMINE 30 MG/ML IJ SOLN
30.0000 mg | Freq: Four times a day (QID) | INTRAMUSCULAR | Status: DC | PRN
Start: 2016-01-26 — End: 2016-01-27
  Administered 2016-01-26 (×2): 30 mg via INTRAVENOUS
  Filled 2016-01-26 (×2): qty 1

## 2016-01-26 MED ORDER — IPRATROPIUM BROMIDE 0.02 % IN SOLN
0.5000 mg | Freq: Three times a day (TID) | RESPIRATORY_TRACT | Status: DC
  Administered 2016-01-27 (×2): 0.5 mg via RESPIRATORY_TRACT
  Filled 2016-01-26 (×2): qty 2.5

## 2016-01-26 MED ORDER — HYDROMORPHONE HCL 1 MG/ML IJ SOLN
0.5000 mg | INTRAMUSCULAR | Status: DC | PRN
  Administered 2016-01-26 – 2016-01-27 (×6): 0.5 mg via INTRAVENOUS
  Filled 2016-01-26 (×6): qty 1

## 2016-01-26 MED ORDER — DEXTROSE 5 % IV SOLN
500.0000 mg | INTRAVENOUS | Status: DC
  Administered 2016-01-26: 500 mg via INTRAVENOUS
  Filled 2016-01-26 (×2): qty 500

## 2016-01-26 MED ORDER — GUAIFENESIN ER 600 MG PO TB12
600.0000 mg | ORAL_TABLET | Freq: Two times a day (BID) | ORAL | Status: DC
  Administered 2016-01-26 – 2016-01-27 (×3): 600 mg via ORAL
  Filled 2016-01-26 (×3): qty 1

## 2016-01-26 MED ORDER — ONDANSETRON HCL 4 MG PO TABS
4.0000 mg | ORAL_TABLET | Freq: Four times a day (QID) | ORAL | Status: DC | PRN
Start: 2016-01-26 — End: 2016-01-27
  Administered 2016-01-26: 4 mg via ORAL
  Filled 2016-01-26: qty 1

## 2016-01-26 MED ORDER — SODIUM CHLORIDE 0.9% FLUSH
3.0000 mL | Freq: Two times a day (BID) | INTRAVENOUS | Status: DC
  Administered 2016-01-26 – 2016-01-27 (×2): 3 mL via INTRAVENOUS

## 2016-01-26 MED ORDER — ONDANSETRON HCL 4 MG/2ML IJ SOLN
4.0000 mg | Freq: Four times a day (QID) | INTRAMUSCULAR | Status: DC | PRN
  Administered 2016-01-26: 4 mg via INTRAVENOUS
  Filled 2016-01-26: qty 2

## 2016-01-26 MED ORDER — ACETAMINOPHEN 325 MG PO TABS
650.0000 mg | ORAL_TABLET | Freq: Four times a day (QID) | ORAL | Status: DC | PRN
  Administered 2016-01-26: 650 mg via ORAL
  Filled 2016-01-26: qty 2

## 2016-01-26 MED ORDER — METOCLOPRAMIDE HCL 5 MG/ML IJ SOLN
10.0000 mg | Freq: Once | INTRAMUSCULAR | Status: AC
  Administered 2016-01-26: 10 mg via INTRAVENOUS
  Filled 2016-01-26: qty 2

## 2016-01-26 MED ORDER — ALBUTEROL (5 MG/ML) CONTINUOUS INHALATION SOLN
15.0000 mg/h | INHALATION_SOLUTION | Freq: Once | RESPIRATORY_TRACT | Status: AC
  Administered 2016-01-26: 15 mg/h via RESPIRATORY_TRACT
  Filled 2016-01-26: qty 20

## 2016-01-26 MED ORDER — METHYLPREDNISOLONE SODIUM SUCC 125 MG IJ SOLR
125.0000 mg | Freq: Once | INTRAMUSCULAR | Status: AC
  Administered 2016-01-26: 125 mg via INTRAVENOUS
  Filled 2016-01-26: qty 2

## 2016-01-26 MED ORDER — SODIUM CHLORIDE 0.9 % IV BOLUS (SEPSIS)
1000.0000 mL | Freq: Once | INTRAVENOUS | Status: AC
  Administered 2016-01-26: 1000 mL via INTRAVENOUS

## 2016-01-26 MED ORDER — ACETAMINOPHEN 650 MG RE SUPP: 650.0000 mg | Freq: Four times a day (QID) | RECTAL | Status: DC | PRN

## 2016-01-26 MED ORDER — DEXTROSE 5 % IV SOLN
1.0000 g | INTRAVENOUS | Status: DC
  Administered 2016-01-26: 1 g via INTRAVENOUS
  Filled 2016-01-26 (×2): qty 10

## 2016-01-26 MED ORDER — IPRATROPIUM BROMIDE 0.02 % IN SOLN
0.5000 mg | Freq: Four times a day (QID) | RESPIRATORY_TRACT | Status: DC
  Administered 2016-01-26 (×2): 0.5 mg via RESPIRATORY_TRACT
  Filled 2016-01-26 (×2): qty 2.5

## 2016-01-26 MED ORDER — METHYLPREDNISOLONE SODIUM SUCC 125 MG IJ SOLR
60.0000 mg | Freq: Three times a day (TID) | INTRAMUSCULAR | Status: DC
  Administered 2016-01-26 – 2016-01-27 (×3): 60 mg via INTRAVENOUS
  Filled 2016-01-26 (×3): qty 2

## 2016-01-26 MED ORDER — DIPHENHYDRAMINE HCL 50 MG/ML IJ SOLN
25.0000 mg | Freq: Once | INTRAMUSCULAR | Status: AC
  Administered 2016-01-26: 25 mg via INTRAVENOUS
  Filled 2016-01-26: qty 1

## 2016-01-26 MED ORDER — OSELTAMIVIR PHOSPHATE 75 MG PO CAPS
75.0000 mg | ORAL_CAPSULE | Freq: Two times a day (BID) | ORAL | Status: DC
  Administered 2016-01-26 – 2016-01-27 (×2): 75 mg via ORAL
  Filled 2016-01-26 (×2): qty 1

## 2016-01-26 MED ORDER — GUAIFENESIN-DM 100-10 MG/5ML PO SYRP: 5.0000 mL | ORAL_SOLUTION | ORAL | Status: DC | PRN

## 2016-01-26 MED ORDER — ENOXAPARIN SODIUM 40 MG/0.4ML ~~LOC~~ SOLN
40.0000 mg | SUBCUTANEOUS | Status: DC
  Administered 2016-01-26 – 2016-01-27 (×2): 40 mg via SUBCUTANEOUS
  Filled 2016-01-26 (×4): qty 0.4

## 2016-01-26 MED ORDER — LEVALBUTEROL HCL 0.63 MG/3ML IN NEBU
0.6300 mg | INHALATION_SOLUTION | Freq: Four times a day (QID) | RESPIRATORY_TRACT | Status: DC | PRN
  Filled 2016-01-26: qty 3

## 2016-01-26 MED ORDER — LEVALBUTEROL HCL 0.63 MG/3ML IN NEBU
0.6300 mg | INHALATION_SOLUTION | Freq: Three times a day (TID) | RESPIRATORY_TRACT | Status: DC
  Administered 2016-01-27 (×2): 0.63 mg via RESPIRATORY_TRACT
  Filled 2016-01-26 (×2): qty 3

## 2016-01-26 MED ORDER — ALUM & MAG HYDROXIDE-SIMETH 200-200-20 MG/5ML PO SUSP: 30.0000 mL | Freq: Four times a day (QID) | ORAL | Status: DC | PRN

## 2016-01-26 MED ORDER — KETOROLAC TROMETHAMINE 30 MG/ML IJ SOLN
30.0000 mg | Freq: Once | INTRAMUSCULAR | Status: AC
  Administered 2016-01-26: 30 mg via INTRAVENOUS
  Filled 2016-01-26: qty 1

## 2016-01-26 MED ORDER — SODIUM CHLORIDE 0.9 % IV SOLN
INTRAVENOUS | Status: DC
  Administered 2016-01-26: 14:00:00 via INTRAVENOUS

## 2016-01-26 NOTE — ED Notes (Signed)
Pt states onset of cp, was seen here during the week for the same, did not stay for completion of evaluation. Pain radiates to the left arm. States had syncopal episode tonight. Reports coughs until she vomits.

## 2016-01-26 NOTE — ED Provider Notes (Signed)
CSN: [id number]     Arrival date & time 01/26/16  0135 History  By signing my name below, I, [name], attest that this documentation has been prepared under the direction and in the presence of Rolland Porter, MD at [phone]. Electronically Signed: [name], ED Scribe. 01/26/2016. 3:52 AM.      Chief Complaint  Patient presents with  . Chest Pain    Patient is a 50 y.o. female presenting with chest pain.  Chest Pain Associated symptoms: cough, diaphoresis, fever, nausea, shortness of breath and vomiting    HPI Comments: Adrienne Garcia is a 50 y.o. female who presents to the Emergency Department complaining of worsening sharp CP onset February 22.Marland Kitchen Pt reports that that the pain last for about 1 minute. Pt states that the pain radiates to her left upper inner arm. She reports she came to the ED and had testing done however she was not seen by a provider and left. Pt reports associated constant chest tightness that began last night about 8 PM. She reports diaphoresis, cough that did have yellow sputum production and is now dry, SOB, wheezing, nausea and vomiting. She states that she has tried Copywriter, advertising with no relief. Pt states she has been using her inhaler that provided temporary relief. She states she has recently ran out her inhaler within the last week. Pt states that the pain is worse when coughing. Pt states that lifting her left arm makes the pain better. Pt reports fever (max temp 103.1) 1 week ago chills, productive cough and mild rhinorrhea 1 week prior. She denies sore throat. She denies diaphoresis. She states her inhaler helps with the wheezing however she ran out of her inhaler a week ago. Pt reports that she did receive her influenza vaccination this year. Denies leg swelling. Pt reports family hx of early CAD in her mother and 2 sisters. Pt later complains of left ear pain. She states she took Alka-Seltzer cold and flu about 7 PM tonight.  PCP none  Past Medical History   Diagnosis Date  . Thyroid disease   . Depression   . Anxiety   . PTSD (post-traumatic stress disorder)   . Intractable nausea and vomiting   . Obesity   . Asthma   . Anemia   . Daily headache   . Migraine     "probably twice/month" (09/19/2015)  . Fibromyalgia   . Bipolar disorder ([place])   . Basal cell carcinoma of neck     "front of my neck; it was a melanoma"   Past Surgical History  Procedure Laterality Date  . Multiple tooth extractions  ~ 2011  . Mole removal  ~ 2013    "front side of my neck"   No family history on file. Social History  Substance Use Topics  . Smoking status: Former Smoker -- 0.50 packs/day for 28 years    Types: Cigarettes  . Smokeless tobacco: None     Comment: "quit smoking ~ 2014"  . Alcohol Use: No   unemployed  OB History    No data available     Review of Systems  Constitutional: Positive for fever and diaphoresis.  Respiratory: Positive for cough, chest tightness, shortness of breath and wheezing.   Cardiovascular: Positive for chest pain.  Gastrointestinal: Positive for nausea and vomiting.  All other systems reviewed and are negative.    Allergies  Ciprofloxacin  Home Medications   Prior to Admission medications   Medication Sig Start Date End Date Taking?  Authorizing Provider  albuterol (PROVENTIL HFA;VENTOLIN HFA) 108 (90 BASE) MCG/ACT inhaler Inhale 2 puffs into the lungs every 6 (six) hours as needed for wheezing or shortness of breath. Patient not taking: Reported on 01/26/2016 09/20/15   Geradine Girt, DO  doxycycline (VIBRA-TABS) 100 MG tablet Take 1 tablet (100 mg total) by mouth every 12 (twelve) hours. Patient not taking: Reported on 01/26/2016 09/20/15   Geradine Girt, DO  ondansetron (ZOFRAN) 4 MG tablet Take 1 tablet (4 mg total) by mouth every 8 (eight) hours as needed for nausea or vomiting. Patient not taking: Reported on 01/26/2016 09/20/15   Geradine Girt, DO  predniSONE (DELTASONE) 20 MG tablet Take 2  tablets (40 mg total) by mouth daily with breakfast. Patient not taking: Reported on 01/26/2016 09/20/15   Tomi Bamberger Vann, DO   BP 131/70 mmHg  Pulse 104  Temp(Src) 98.3 F (36.8 C) (Oral)  Resp 23  Ht 5\' 7"  (1.702 m)  Wt 230 lb (104.327 kg)  BMI 36.01 kg/m2  SpO2 93%  LMP 12/16/2011  Vital signs normal except tachycardia    Physical Exam  Constitutional: She is oriented to person, place, and time. She appears well-developed and well-nourished.  Non-toxic appearance. She does not appear ill. No distress.  HENT:  Head: Normocephalic and atraumatic.  Nose: Nose normal. No mucosal edema or rhinorrhea.  Mouth/Throat: Oropharynx is clear and moist and mucous membranes are normal. No dental abscesses or uvula swelling.  TM's obscured by cerumen.   Eyes: Conjunctivae and EOM are normal. Pupils are equal, round, and reactive to light.  Neck: Normal range of motion and full passive range of motion without pain. Neck supple.  Cardiovascular: Normal rate, regular rhythm and normal heart sounds.  Exam reveals no gallop and no friction rub.   No murmur heard. Pulmonary/Chest: Effort normal. No respiratory distress. She has wheezes. She has rhonchi. She exhibits no tenderness and no crepitus.    Diffuse wheezing and rhonchi all lung fields.  Bilateral costochondral tenderness that reproduces her complaints of pain.    Abdominal: Soft. Normal appearance and bowel sounds are normal. She exhibits no distension. There is no tenderness. There is no rebound and no guarding.  Musculoskeletal: Normal range of motion. She exhibits no edema or tenderness.  Moves all extremities well.   Neurological: She is alert and oriented to person, place, and time. She has normal strength. No cranial nerve deficit.  Skin: Skin is warm, dry and intact. No rash noted. No erythema. No pallor.  Psychiatric: She has a normal mood and affect. Her speech is normal and behavior is normal. Her mood appears not anxious.   Nursing note and vitals reviewed.   ED Course  Procedures (including critical care time)  Medications  ondansetron (ZOFRAN) injection 4 mg (4 mg Intravenous Given 01/26/16 0410)  sodium chloride 0.9 % bolus 1,000 mL (1,000 mLs Intravenous New Bag/Given 01/26/16 0602)  sodium chloride 0.9 % bolus 1,000 mL (0 mLs Intravenous Stopped 01/26/16 0602)  albuterol (PROVENTIL,VENTOLIN) solution continuous neb (15 mg/hr Nebulization Given 01/26/16 0433)  ipratropium (ATROVENT) nebulizer solution 0.5 mg (0.5 mg Nebulization Given 01/26/16 0433)  methylPREDNISolone sodium succinate (SOLU-MEDROL) 125 mg/2 mL injection 125 mg (125 mg Intravenous Given 01/26/16 0411)  ketorolac (TORADOL) 30 MG/ML injection 30 mg (30 mg Intravenous Given 01/26/16 0411)  metoCLOPramide (REGLAN) injection 10 mg (10 mg Intravenous Given 01/26/16 0512)  diphenhydrAMINE (BENADRYL) injection 25 mg (25 mg Intravenous Given 01/26/16 0512)    DIAGNOSTIC STUDIES: Oxygen  Saturation is 96% on RA, normal by my interpretation.    COORDINATION OF CARE: 4:07 AM-Discussed treatment plan with pt at bedside and pt agreed to plan.   Patient was given IV fluids for her dehydration and IV Zofran. She continued to complain of nausea and she was given IV Reglan and Benadryl. She was started on a continuous nebulizer of 50 mg/h for her wheezing and bronchospasm. She was also given Solu-Medrol IV. She was given Toradol for her chest wall pain.  Recheck at 6:50 AM patient still has scattered wheezing. We discussed admission to the hospital she is agreeable.  07:35 Dr Allyson Sabal, states they have already seen patient and are admitting.   Labs Review Results for orders placed or performed during the hospital encounter of [id number]  Basic metabolic panel  Result Value Ref Range   Sodium 141 135 - 145 mmol/L   Potassium 3.9 3.5 - 5.1 mmol/L   Chloride 107 101 - 111 mmol/L   CO2 22 22 - 32 mmol/L   Glucose, Bld 113 (H) 65 - 99 mg/dL   BUN 10 6 - 20 mg/dL    Creatinine, Ser 0.65 0.44 - 1.00 mg/dL   Calcium 9.3 8.9 - 10.3 mg/dL   GFR calc non Af Amer >60 >60 mL/min   GFR calc Af Amer >60 >60 mL/min   Anion gap 12 5 - 15  CBC  Result Value Ref Range   WBC 4.8 4.0 - 10.5 K/uL   RBC 4.40 3.87 - 5.11 MIL/uL   Hemoglobin 13.3 12.0 - 15.0 g/dL   HCT 40.2 36.0 - 46.0 %   MCV 91.4 78.0 - 100.0 fL   MCH 30.2 26.0 - 34.0 pg   MCHC 33.1 30.0 - 36.0 g/dL   RDW 12.8 11.5 - 15.5 %   Platelets 123 (L) 150 - 400 K/uL  I-stat troponin, ED (not at [facility], [facility])  Result Value Ref Range   Troponin i, poc 0.00 0.00 - 0.08 ng/mL   Comment 3           Laboratory interpretation all normal except     Imaging Review Dg Chest 2 View  01/26/2016  CLINICAL DATA:  Acute onset of generalized chest pain, radiating to the left arm. Syncope and cough. Initial encounter. EXAM: CHEST  2 VIEW COMPARISON:  Chest radiograph performed 01/22/2016 FINDINGS: The lungs are well-aerated and clear. There is no evidence of focal opacification, pleural effusion or pneumothorax. The heart is normal in size; the mediastinal contour is within normal limits. No acute osseous abnormalities are seen. IMPRESSION: No acute cardiopulmonary process seen. Electronically Signed   By: Garald Balding M.D.   On: 01/26/2016 02:07    Dg Chest 2 View  01/22/2016  CLINICAL DATA:  Chest pain about the sternum and right arm pain for 2 weeks. Initial encounter.Marland Kitchen IMPRESSION: No acute disease. Electronically Signed   By: Inge Rise M.D.   On: 01/22/2016 13:09   I have personally reviewed and evaluated these images and lab results as part of my medical decision-making.   EKG Interpretation   Date/Time:  Sunday January 26 2016 01:40:47 EST Ventricular Rate:  62 PR Interval:  118 QRS Duration: 94 QT Interval:  428 QTC Calculation: 434 R Axis:   89 Text Interpretation:  Normal sinus rhythm Normal ECG No significant change  since last tracing 22 Jan 2016 Confirmed by Tevis Conger  MD-I, Lucious Zou ([id number]) on   01/26/2016 7:27:43 AM      MDM   Final  diagnoses:  Costochondritis, acute  Nausea and vomiting, vomiting of unspecified type  Bronchitis with bronchospasm   Plan admission   Rolland Porter, MD, FACEP  CRITICAL CARE Performed by: Rolland Porter L Total critical care time: 38 minutes Critical care time was exclusive of separately billable procedures and treating other patients. Critical care was necessary to treat or prevent imminent or life-threatening deterioration. Critical care was time spent personally by me on the following activities: development of treatment plan with patient and/or surrogate as well as nursing, discussions with consultants, evaluation of patient's response to treatment, examination of patient, obtaining history from patient or surrogate, ordering and performing treatments and interventions, ordering and review of laboratory studies, ordering and review of radiographic studies, pulse oximetry and re-evaluation of patient's condition.   I personally performed the services described in this documentation, which was scribed in my presence. The recorded information has been reviewed and considered.  Rolland Porter, MD, Barbette Or, MD 01/26/16 [phone]

## 2016-01-26 NOTE — ED Notes (Signed)
Report attempted 

## 2016-01-26 NOTE — Care Management (Signed)
NO PCP PER PATIENT    Demographics  Comment      Last edited by  on at    Address: Home Phone: Work Futures trader:   [id number]  [place] [address]   [phone] -- --   SSN: Insurance: Marital Status: Religion:   SSN-[id number] MEDICAID POTENTIAL Divorced Non-Denominational    Patient Ethnicity & Race    Ethnic Group Patient Race   Not Hispanic or Latino White or Caucasian    Documents Filed to Patient    Power of Attorney Living Will Code Status MyChart Status   Not on File Not on File FULL [Updated on 01/26/16 0827] Pending    Admission Information    Attending Provider Admitting Provider Admission Type Admission Date/Time   Reyne Dumas, MD  Emergency 01/26/16 0330   Discharge Date Hospital Service Auth/Cert Status Service Area    Emergency Medicine Incomplete [place]   Unit Room/Bed Admission Status   [facility]EM[facility]Y DEPT OTFC/OTF Admission (Confirmed)         Hospital Account    Name Acct ID Class Status Primary Coverage   Adrienne Garcia, Adrienne Garcia [id number] Inpatient Open MEDICAID POTENTIAL - MEDICAID POTENTIAL        Guarantor Account (for Hospital Account [id number])    Name Relation to Pt Service Area Active? Acct Type   Coletta Memos Self [facility] Yes Personal/Family   Address Phone       [address] [place], [place] [address] [phone])          Coverage Information (for Hospital Account [id number])    F/O Payor/Plan Precert #   MEDICAID POTENTIAL/MEDICAID POTENTIAL    Subscriber Subscriber #   [name], [name]    Address Phone   [address] [place], [place] [address] [phone]

## 2016-01-26 NOTE — ED Notes (Signed)
Pt c/o headache -- pain meds given

## 2016-01-26 NOTE — H&P (Addendum)
[facility] History and Physical  Belita Chronis [id number] DOB: 02/21/1966 DOA: 01/26/2016  Referring physician:  PCP: No PCP Per Patient   Chief Complaint: Shortness of breath   HPI:  50 year old female with a history of depression, anxiety, PTSD, asthma,  prior history of smoking who presents to the ER for chest pain. Patient has been sick for the last 2 weeks with a viral URI. Patient was in the ER on Tuesday, waited for about 9 hours, could not see a physician and went home. Continued to feel worse, with worsening chest tightness, worsening shortness of breath, wheezing. Subsequently developed left facial pain and left ear pain, fevers up to 103.1, associated with chills, productive cough and rhinorrhea. Present today with pleuritic chest pain worse with coughing, worse when she takes in a deep breath. Patient has been taking alka seltzer for her symptoms. Patient ran out of her albuterol inhaler a week ago. Patient found to be wheezing and very uncomfortable during the time of this interview. Patient is being admitted for acute viral bronchitis with pleurisy. EKG shows normal sinus rhythm. Cardiac enzymes negative 2. Chest x-ray no acute cardiopulmonary disease.      Review of Systems: negative for the following  Constitutional: Denies fever , chills and diaphoresis appetite change and fatigue.  HEENT: Denies photophobia, eye pain, redness, hearing loss, ear pain, congestion, sore throat, rhinorrhea, sneezing, mouth sores, trouble swallowing, neck pain, neck stiffness and tinnitus.  Respiratory: Denies SOB, DOE, cough, chest tightness, shortness of breath and wheezing Cardiovascular: Denies chest pain, palpitations and leg swelling.  Gastrointestinal: Denies nausea and vomiting.  abdominal pain, diarrhea, constipation, blood in stool and abdominal distention.  Genitourinary: Denies dysuria, urgency, frequency, hematuria, flank pain and difficulty urinating.  Musculoskeletal:  Denies myalgias, back pain, joint swelling, arthralgias and gait problem.  Skin: Denies pallor, rash and wound.  Neurological: Denies dizziness, seizures, syncope, weakness, light-headedness, numbness and headaches.  Hematological: Denies adenopathy. Easy bruising, personal or family bleeding history  Psychiatric/Behavioral: Denies suicidal ideation, mood changes, confusion, nervousness, sleep disturbance and agitation       Past Medical History  Diagnosis Date  . Thyroid disease   . Depression   . Anxiety   . PTSD (post-traumatic stress disorder)   . Intractable nausea and vomiting   . Obesity   . Asthma   . Anemia   . Daily headache   . Migraine     "probably twice/month" (09/19/2015)  . Fibromyalgia   . Bipolar disorder ([place])   . Basal cell carcinoma of neck     "front of my neck; it was a melanoma"     Past Surgical History  Procedure Laterality Date  . Multiple tooth extractions  ~ 2011  . Mole removal  ~ 2013    "front side of my neck"      Social History:  reports that she has quit smoking. Her smoking use included Cigarettes. She has a 14 pack-year smoking history. She does not have any smokeless tobacco history on file. She reports that she uses illicit drugs (Marijuana). She reports that she does not drink alcohol.    Allergies  Allergen Reactions  . Ciprofloxacin Hives        FAMILY HISTORY  When questioned  Directly-patient reports  No family history of HTN, CVA ,DIABETES, TB, Cancer CAD, Bleeding Disorders, Sickle Cell, diabetes, anemia, asthma,   Prior to Admission medications   Medication Sig Start Date End Date Taking? Authorizing Provider  albuterol (PROVENTIL HFA;VENTOLIN HFA) 108 (  90 BASE) MCG/ACT inhaler Inhale 2 puffs into the lungs every 6 (six) hours as needed for wheezing or shortness of breath. Patient not taking: Reported on 01/26/2016 09/20/15   Geradine Girt, DO  doxycycline (VIBRA-TABS) 100 MG tablet Take 1 tablet (100 mg total) by  mouth every 12 (twelve) hours. Patient not taking: Reported on 01/26/2016 09/20/15   Geradine Girt, DO  ondansetron (ZOFRAN) 4 MG tablet Take 1 tablet (4 mg total) by mouth every 8 (eight) hours as needed for nausea or vomiting. Patient not taking: Reported on 01/26/2016 09/20/15   Geradine Girt, DO  predniSONE (DELTASONE) 20 MG tablet Take 2 tablets (40 mg total) by mouth daily with breakfast. Patient not taking: Reported on 01/26/2016 09/20/15   Geradine Girt, DO     Physical Exam: Filed Vitals:   01/26/16 0515 01/26/16 0545 01/26/16 0615 01/26/16 0645  BP: 135/69 114/54 127/64 131/70  Pulse: 73 93 107 104  Temp:      TempSrc:      Resp: 13 23 21 23   Height:      Weight:      SpO2: 100% 100% 94% 93%     Constitutional: Vital signs reviewed. Patient is a well-developed and well-nourished in no acute distress and cooperative with exam. Alert and oriented x3.  Head: Normocephalic and atraumatic  Ear: TM normal bilaterally  Mouth: no erythema or exudates, dry oral mucous membrane  Eyes: PERRL, EOMI, conjunctivae normal, No scleral icterus.  Neck: Supple, Trachea midline normal ROM, No JVD, mass, thyromegaly, or carotid bruit present.  Cardiovascular: RRR, S1 normal, S2 normal, no MRG, pulses symmetric and intact bilaterally  Pulmonary/Chest: CTAB, She has wheezes. She has rhonchi, Bilateral costochondral tenderness that reproduces her complaints of pain Abdominal: Soft. Non-tender, non-distended, bowel sounds are normal, no masses, organomegaly, or guarding present.  GU: no CVA tenderness Musculoskeletal: No joint deformities, erythema, or stiffness, ROM full and no nontender Ext: no edema and no cyanosis, pulses palpable bilaterally (DP and PT)  Hematology: no cervical, inginal, or axillary adenopathy.  Neurological: A&O x3, Strenght is normal and symmetric bilaterally, cranial nerve II-XII are grossly intact, no focal motor deficit, sensory intact to light touch bilaterally.   Skin: Warm, dry and intact. No rash, cyanosis, or clubbing.  Psychiatric: Normal mood and affect. speech and behavior is normal. Judgment and thought content normal. Cognition and memory are normal.      Data Review   Micro Results No results found for this or any previous visit (from the past 240 hour(s)).  Radiology Reports Dg Chest 2 View  01/26/2016  CLINICAL DATA:  Acute onset of generalized chest pain, radiating to the left arm. Syncope and cough. Initial encounter. EXAM: CHEST  2 VIEW COMPARISON:  Chest radiograph performed 01/22/2016 FINDINGS: The lungs are well-aerated and clear. There is no evidence of focal opacification, pleural effusion or pneumothorax. The heart is normal in size; the mediastinal contour is within normal limits. No acute osseous abnormalities are seen. IMPRESSION: No acute cardiopulmonary process seen. Electronically Signed   By: Garald Balding M.D.   On: 01/26/2016 02:07   Dg Chest 2 View  01/22/2016  CLINICAL DATA:  Chest pain about the sternum and right arm pain for 2 weeks. Initial encounter. EXAM: CHEST  2 VIEW COMPARISON:  Single view of the chest 09/20/2015. PA and lateral chest 06/27/2014. FINDINGS: The lungs are clear. Heart size is normal. There is no pneumothorax or pleural effusion. No focal bony abnormality is identified.  IMPRESSION: No acute disease. Electronically Signed   By: Inge Rise M.D.   On: 01/22/2016 13:09     CBC  Recent Labs Lab 01/22/16 1300 01/26/16 0155  WBC 6.2 4.8  HGB 13.6 13.3  HCT 41.4 40.2  PLT 132* 123*  MCV 93.0 91.4  MCH 30.6 30.2  MCHC 32.9 33.1  RDW 12.9 12.8    Chemistries   Recent Labs Lab 01/22/16 1300 01/26/16 0155  NA 141 141  K 4.0 3.9  CL 106 107  CO2 22 22  GLUCOSE 123* 113*  BUN 9 10  CREATININE 0.66 0.65  CALCIUM 9.9 9.3   ------------------------------------------------------------------------------------------------------------------ estimated creatinine clearance is 105.7  mL/min (by C-G formula based on Cr of 0.65). ------------------------------------------------------------------------------------------------------------------ No results for input(s): HGBA1C in the last 72 hours. ------------------------------------------------------------------------------------------------------------------ No results for input(s): CHOL, HDL, LDLCALC, TRIG, CHOLHDL, LDLDIRECT in the last 72 hours. ------------------------------------------------------------------------------------------------------------------ No results for input(s): TSH, T4TOTAL, T3FREE, THYROIDAB in the last 72 hours.  Invalid input(s): FREET3 ------------------------------------------------------------------------------------------------------------------ No results for input(s): VITAMINB12, FOLATE, FERRITIN, TIBC, IRON, RETICCTPCT in the last 72 hours.  Coagulation profile No results for input(s): INR, PROTIME in the last 168 hours.  No results for input(s): DDIMER in the last 72 hours.  Cardiac Enzymes No results for input(s): CKMB, TROPONINI, MYOGLOBIN in the last 168 hours.  Invalid input(s): CK ------------------------------------------------------------------------------------------------------------------ Invalid input(s): POCBNP   CBG: No results for input(s): GLUCAP in the last 168 hours.     EKG: Independently reviewed.  Date/Time: Sunday January 26 2016 01:40:47 EST Ventricular Rate: 62 PR Interval: 118 QRS Duration: 94 QT Interval: 428 QTC Calculation: 434 R Axis: 89 Text Interpretation: Normal sinus rhythm Normal ECG No significant change  since last tracing 22 Jan 2016   Assessment/Plan Principal Problem:   Acute bronchitis with asthma with acute exacerbation, influenza B Chest pain is atypical, likely noncardiac, cardiac enzymes have been negative, EKG within normal limits Patient is being admitted for acute asthmatic bronchitis, will start patient on IV  steroids, empiric antibiotics, nebulizer treatments Maintain patient on telemetry, will use Toradol and narcotics for chest pain Positive influenza PCR, respiratory virus panel    Intractable nausea and vomiting, likely related to his coughing spells Use cough suppressants, Zofran as needed    Thrombocytopenia (HCC)-likely idiopathic  vs related to viral infection,follow     Obesity Body mass index is 36.01 kg/(m^2).    Depression-denies any suicidal ideation, appears to be stable         Code Status Orders        Start     Ordered   01/26/16 0826  Full code   Continuous     02 /26/17 0827    Family Communication: bedside Disposition Plan: admit   Total time spent 55 minutes.Greater than 50% of this time was spent in counseling, explanation of diagnosis, planning of further management, and coordination of care  [facility] Pager [phone]  If 7PM-7AM, please contact night-coverage [email or web address] Password [email or web address] 01/26/2016, 8:30 AM

## 2016-01-27 ENCOUNTER — Inpatient Hospital Stay (HOSPITAL_COMMUNITY): Payer: MEDICAID

## 2016-01-27 DIAGNOSIS — J209 Acute bronchitis, unspecified: Secondary | ICD-10-CM

## 2016-01-27 DIAGNOSIS — J45901 Unspecified asthma with (acute) exacerbation: Secondary | ICD-10-CM

## 2016-01-27 DIAGNOSIS — R1110 Vomiting, unspecified: Secondary | ICD-10-CM

## 2016-01-27 LAB — HEMOGLOBIN A1C
Hgb A1c MFr Bld: 5.5 % (ref 4.8–5.6)
Mean Plasma Glucose: 111 mg/dL

## 2016-01-27 LAB — COMPREHENSIVE METABOLIC PANEL WITH GFR
ALT: 19 U/L (ref 14–54)
AST: 18 U/L (ref 15–41)
Albumin: 3.4 g/dL — ABNORMAL LOW (ref 3.5–5.0)
Alkaline Phosphatase: 76 U/L (ref 38–126)
Anion gap: 11 (ref 5–15)
BUN: 11 mg/dL (ref 6–20)
CO2: 20 mmol/L — ABNORMAL LOW (ref 22–32)
Calcium: 8.8 mg/dL — ABNORMAL LOW (ref 8.9–10.3)
Chloride: 106 mmol/L (ref 101–111)
Creatinine, Ser: 0.63 mg/dL (ref 0.44–1.00)
GFR calc Af Amer: >60 mL/min
GFR calc non Af Amer: >60 mL/min
Glucose, Bld: 136 mg/dL — ABNORMAL HIGH (ref 65–99)
Potassium: 3.9 mmol/L (ref 3.5–5.1)
Sodium: 137 mmol/L (ref 135–145)
Total Bilirubin: 0.4 mg/dL (ref 0.3–1.2)
Total Protein: 5.9 g/dL — ABNORMAL LOW (ref 6.5–8.1)

## 2016-01-27 LAB — CBC
HCT: 34.9 % — ABNORMAL LOW (ref 36.0–46.0)
Hemoglobin: 11.5 g/dL — ABNORMAL LOW (ref 12.0–15.0)
MCH: 30.2 pg (ref 26.0–34.0)
MCHC: 33 g/dL (ref 30.0–36.0)
MCV: 91.6 fL (ref 78.0–100.0)
Platelets: 110 10*3/uL — ABNORMAL LOW (ref 150–400)
RBC: 3.81 MIL/uL — ABNORMAL LOW (ref 3.87–5.11)
RDW: 13.2 % (ref 11.5–15.5)
WBC: 7.1 10*3/uL (ref 4.0–10.5)

## 2016-01-27 MED ORDER — PREDNISONE 50 MG PO TABS: 50.0000 mg | ORAL_TABLET | Freq: Every day | ORAL | Status: DC

## 2016-01-27 MED ORDER — METHYLPREDNISOLONE SODIUM SUCC 125 MG IJ SOLR: 40.0000 mg | Freq: Two times a day (BID) | INTRAMUSCULAR | Status: DC

## 2016-01-27 MED ORDER — MORPHINE SULFATE (PF) 2 MG/ML IV SOLN
1.0000 mg | INTRAVENOUS | Status: DC | PRN
  Administered 2016-01-27 (×2): 1 mg via INTRAVENOUS
  Filled 2016-01-27 (×2): qty 1

## 2016-01-27 MED ORDER — OSELTAMIVIR PHOSPHATE 75 MG PO CAPS: 75.0000 mg | ORAL_CAPSULE | Freq: Two times a day (BID) | ORAL | Status: DC

## 2016-01-27 MED ORDER — PREDNISONE 20 MG PO TABS: ORAL_TABLET | ORAL | Status: DC

## 2016-01-27 MED ORDER — PROCHLORPERAZINE MALEATE 5 MG PO TABS
5.0000 mg | ORAL_TABLET | Freq: Four times a day (QID) | ORAL | Status: DC | PRN
  Administered 2016-01-27: 5 mg via ORAL
  Filled 2016-01-27 (×2): qty 1

## 2016-01-27 NOTE — Progress Notes (Signed)
Pt has decided to leave AMA. Risks to leaving AMA explained to pt in full detail including possibility for sudden death. Pt is AOx4 and verbalized understanding but still wishes to sign AMA. AMA papers signed. IV removed. Telemetry removed. Pt received tamiflu and prednisone prescription. Pt left floor with all belongings.

## 2016-01-27 NOTE — Progress Notes (Signed)
OT Sign off Note  Patient Details Name: Elgie Curtis MRN: [id number] DOB: 1966-08-16   Cancelled Treatment:    Reason Eval/Treat Not Completed: OT screened, no needs identified, will sign off Pt is at independent level per RN and PT Santiago Glad. Ot to sign off acutely  Vonita Moss   OTR/L Pager: [phone] Office: [phone] .  01/27/2016, 10:29 AM

## 2016-01-27 NOTE — Progress Notes (Signed)
Pt walked in hallway independently without any complications. Pt reports no weakness. Will continue to monitor.

## 2016-01-27 NOTE — Progress Notes (Signed)
PT Cancellation Note  Patient Details Name: Adrienne Garcia MRN: [id number] DOB: 06-18-1966   Cancelled Treatment:    Reason Eval/Treat Not Completed: PT screened, no needs identified, will sign off. Spoke with pt and nurse.  Pt has been getting up in room without difficulty.  Pt not interested in therapy services.  Nurse states she can walk with her in the hallway later.  If patient's status changes please re-order. Thank you for the referral.   Nasha Diss LUBECK 01/27/2016, 10:28 AM

## 2016-01-27 NOTE — Progress Notes (Signed)
[facility] PROGRESS NOTE  Adrienne Garcia [id number] DOB: November 13, 1966 DOA: 01/26/2016 PCP: No PCP Per Patient  Assessment/Plan: Asthma exacerbation/ influenza B -improving with Tamiflu, IV steroids, nebs -stop IV abx -supportive care -cut down steroids today, prednisone taper tomorrow   Pleuritic chest pain =due to #1, improved   Intractable nausea and vomiting,  -due to FLu, post tussive -improving, compazine PRN   Thrombocytopenia -due to FLu, monitor   Obesity  -life style modification.   Depression - stable  DVT proph: lovenox  Code Status: Full Code Family Communication: none  Disposition Plan: Home tomorrow     HPI/Subjective: Feels better, cough and pleuritic CP improving  Objective: Filed Vitals:   01/26/16 2051 01/27/16 0344  BP: 134/90 151/74  Pulse: 96 43  Temp: 97.8 F (36.6 C) 97.8 F (36.6 C)  Resp: 18 18    Intake/Output Summary (Last 24 hours) at 01/27/16 1148 Last data filed at 01/27/16 0935  Gross per 24 hour  Intake 3066.25 ml  Output   2050 ml  Net 1016.25 ml   Filed Weights   01/26/16 0145 01/26/16 1200 01/27/16 0344  Weight: 104.327 kg (230 lb) 105.6 kg (232 lb 12.9 oz) 105.325 kg (232 lb 3.2 oz)    Exam:   General:  AAOx3  Cardiovascular: S1S2/RRR  Respiratory: no wheezes  Abdomen: soft, NT, Bs present  Musculoskeletal: no edema c/c   Data Reviewed: Basic Metabolic Panel:  Recent Labs Lab 01/22/16 1300 01/26/16 0155 01/26/16 1000 01/27/16 0408  NA 141 141  --  137  K 4.0 3.9  --  3.9  CL 106 107  --  106  CO2 22 22  --  20*  GLUCOSE 123* 113*  --  136*  BUN 9 10  --  11  CREATININE 0.66 0.65 0.79 0.63  CALCIUM 9.9 9.3  --  8.8*  MG  --   --  1.6*  --    Liver Function Tests:  Recent Labs Lab 01/27/16 0408  AST 18  ALT 19  ALKPHOS 76  BILITOT 0.4  PROT 5.9*  ALBUMIN 3.4*   No results for input(s): LIPASE, AMYLASE in the last 168 hours. No results for input(s): AMMONIA in the  last 168 hours. CBC:  Recent Labs Lab 01/22/16 1300 01/26/16 0155 01/26/16 1000 01/27/16 0408  WBC 6.2 4.8 5.2 7.1  HGB 13.6 13.3 12.0 11.5*  HCT 41.4 40.2 35.3* 34.9*  MCV 93.0 91.4 91.7 91.6  PLT 132* 123* 109* 110*   Cardiac Enzymes:  Recent Labs Lab 01/26/16 1000 01/26/16 1504 01/26/16 2017  TROPONINI <0.03 <0.03 <0.03   BNP (last 3 results) No results for input(s): BNP in the last 8760 hours.  ProBNP (last 3 results) No results for input(s): PROBNP in the last 8760 hours.  CBG: No results for input(s): GLUCAP in the last 168 hours.  Recent Results (from the past 240 hour(s))  MRSA PCR Screening     Status: None   Collection Time: 01/26/16  3:28 PM  Result Value Ref Range Status   MRSA by PCR NEGATIVE NEGATIVE Final    Comment:        The GeneXpert MRSA Assay (FDA approved for NASAL specimens only), is one component of a comprehensive MRSA colonization surveillance program. It is not intended to diagnose MRSA infection nor to guide or monitor treatment for MRSA infections.      Studies: Dg Chest 2 View  01/26/2016  CLINICAL DATA:  Acute onset of generalized chest pain, radiating  to the left arm. Syncope and cough. Initial encounter. EXAM: CHEST  2 VIEW COMPARISON:  Chest radiograph performed 01/22/2016 FINDINGS: The lungs are well-aerated and clear. There is no evidence of focal opacification, pleural effusion or pneumothorax. The heart is normal in size; the mediastinal contour is within normal limits. No acute osseous abnormalities are seen. IMPRESSION: No acute cardiopulmonary process seen. Electronically Signed   By: Garald Balding M.D.   On: 01/26/2016 02:07    Scheduled Meds: . enoxaparin (LOVENOX) injection  40 mg Subcutaneous Q24H  . guaiFENesin  600 mg Oral BID  . ipratropium  0.5 mg Nebulization TID  . levalbuterol  0.63 mg Nebulization TID  . methylPREDNISolone sodium succinate  40 mg Intravenous Q12H  . oseltamivir  75 mg Oral BID  .  sodium chloride flush  3 mL Intravenous Q12H   Continuous Infusions: . sodium chloride 50 mL/hr at 01/27/16 [id number]   Antibiotics Given (last 72 hours)    Date/Time Action Medication Dose   01/26/16 1532 Given   oseltamivir (TAMIFLU) capsule 75 mg 75 mg   01/27/16 [id number] Given   oseltamivir (TAMIFLU) capsule 75 mg 75 mg      Principal Problem:   Acute bronchitis with asthma with acute exacerbation Active Problems:   Intractable nausea and vomiting   Thrombocytopenia (HCC)   Obesity   Depression   Acute bronchitis with bronchospasm    Time spent: 57min    Adrienne Garcia  [facility] Pager [phone]. If 7PM-7AM, please contact night-coverage at [email or web address], password [facility] 01/27/2016, 11:48 AM  LOS: 1 day

## 2016-01-27 NOTE — CV Procedure (Signed)
The patient refused the Echocardiogram exam "I am no longer having chest pain and I'm feeling better".  Adrienne Garcia [facility] 01/27/2016

## 2016-01-28 LAB — RESPIRATORY VIRUS PANEL
Adenovirus: NEGATIVE
Influenza A: NEGATIVE
Influenza B: POSITIVE — AB
METAPNEUMOVIRUS: NEGATIVE
PARAINFLUENZA 1 A: NEGATIVE
PARAINFLUENZA 2 A: NEGATIVE
Parainfluenza 3: NEGATIVE
RHINOVIRUS: NEGATIVE
Respiratory Syncytial Virus A: NEGATIVE
Respiratory Syncytial Virus B: NEGATIVE

## 2016-01-29 ENCOUNTER — Encounter (HOSPITAL_COMMUNITY): Payer: Self-pay | Admitting: Emergency Medicine

## 2016-01-29 ENCOUNTER — Emergency Department (HOSPITAL_COMMUNITY): Payer: MEDICAID

## 2016-01-29 ENCOUNTER — Inpatient Hospital Stay (HOSPITAL_COMMUNITY)
Admission: EM | Admit: 2016-01-29 | Discharge: 2016-01-30 | DRG: 194 | Disposition: A | Discharge status: Home / Self Care | Payer: Self-pay | Attending: Internal Medicine | Admitting: Internal Medicine

## 2016-01-29 DIAGNOSIS — Z85820 Personal history of malignant melanoma of skin: Secondary | ICD-10-CM

## 2016-01-29 DIAGNOSIS — E669 Obesity, unspecified: Secondary | ICD-10-CM | POA: Diagnosis present

## 2016-01-29 DIAGNOSIS — F319 Bipolar disorder, unspecified: Secondary | ICD-10-CM | POA: Diagnosis present

## 2016-01-29 DIAGNOSIS — M797 Fibromyalgia: Secondary | ICD-10-CM | POA: Diagnosis present

## 2016-01-29 DIAGNOSIS — J45902 Unspecified asthma with status asthmaticus: Secondary | ICD-10-CM | POA: Diagnosis present

## 2016-01-29 DIAGNOSIS — Z87891 Personal history of nicotine dependence: Secondary | ICD-10-CM

## 2016-01-29 DIAGNOSIS — J209 Acute bronchitis, unspecified: Secondary | ICD-10-CM | POA: Diagnosis present

## 2016-01-29 DIAGNOSIS — Z6836 Body mass index (BMI) 36.0-36.9, adult: Secondary | ICD-10-CM

## 2016-01-29 DIAGNOSIS — J101 Influenza due to other identified influenza virus with other respiratory manifestations: Principal | ICD-10-CM

## 2016-01-29 DIAGNOSIS — J45901 Unspecified asthma with (acute) exacerbation: Secondary | ICD-10-CM

## 2016-01-29 DIAGNOSIS — F4310 Post-traumatic stress disorder, unspecified: Secondary | ICD-10-CM | POA: Diagnosis present

## 2016-01-29 DIAGNOSIS — F3112 Bipolar disorder, current episode manic without psychotic features, moderate: Secondary | ICD-10-CM

## 2016-01-29 DIAGNOSIS — J111 Influenza due to unidentified influenza virus with other respiratory manifestations: Secondary | ICD-10-CM | POA: Diagnosis present

## 2016-01-29 LAB — BASIC METABOLIC PANEL
Anion gap: 13 (ref 5–15)
BUN: 18 mg/dL (ref 6–20)
CHLORIDE: 105 mmol/L (ref 101–111)
CO2: 23 mmol/L (ref 22–32)
Calcium: 9.4 mg/dL (ref 8.9–10.3)
Creatinine, Ser: 0.74 mg/dL (ref 0.44–1.00)
GFR calc Af Amer: >60 mL/min (ref >60)
GFR calc non Af Amer: >60 mL/min (ref >60)
Glucose, Bld: 102 mg/dL — ABNORMAL HIGH (ref 65–99)
POTASSIUM: 3.8 mmol/L (ref 3.5–5.1)
SODIUM: 141 mmol/L (ref 135–145)

## 2016-01-29 LAB — CBC
HEMATOCRIT: 39.1 % (ref 36.0–46.0)
Hemoglobin: 13 g/dL (ref 12.0–15.0)
MCH: 30.4 pg (ref 26.0–34.0)
MCHC: 33.2 g/dL (ref 30.0–36.0)
MCV: 91.6 fL (ref 78.0–100.0)
Platelets: 156 10*3/uL (ref 150–400)
RBC: 4.27 MIL/uL (ref 3.87–5.11)
RDW: 12.9 % (ref 11.5–15.5)
WBC: 9.8 10*3/uL (ref 4.0–10.5)

## 2016-01-29 LAB — I-STAT TROPONIN, ED: Troponin i, poc: 0 ng/mL (ref 0.00–0.08)

## 2016-01-29 MED ORDER — ALBUTEROL SULFATE (2.5 MG/3ML) 0.083% IN NEBU
5.0000 mg | INHALATION_SOLUTION | Freq: Once | RESPIRATORY_TRACT | Status: AC
  Administered 2016-01-29: 5 mg via RESPIRATORY_TRACT
  Filled 2016-01-29: qty 6

## 2016-01-29 MED ORDER — FENTANYL CITRATE (PF) 100 MCG/2ML IJ SOLN
100.0000 ug | Freq: Once | INTRAMUSCULAR | Status: AC
  Administered 2016-01-29: 100 ug via INTRAVENOUS
  Filled 2016-01-29: qty 2

## 2016-01-29 MED ORDER — METHYLPREDNISOLONE SODIUM SUCC 125 MG IJ SOLR
125.0000 mg | Freq: Once | INTRAMUSCULAR | Status: AC
  Administered 2016-01-29: 125 mg via INTRAVENOUS
  Filled 2016-01-29: qty 2

## 2016-01-29 MED ORDER — ALBUTEROL SULFATE (2.5 MG/3ML) 0.083% IN NEBU
5.0000 mg | INHALATION_SOLUTION | RESPIRATORY_TRACT | Status: DC | PRN
  Administered 2016-01-29: 5 mg via RESPIRATORY_TRACT
  Filled 2016-01-29: qty 6

## 2016-01-29 MED ORDER — ONDANSETRON HCL 4 MG PO TABS: 4.0000 mg | ORAL_TABLET | Freq: Four times a day (QID) | ORAL | Status: DC | PRN

## 2016-01-29 MED ORDER — OXYCODONE-ACETAMINOPHEN 5-325 MG PO TABS
1.0000 | ORAL_TABLET | Freq: Once | ORAL | Status: AC
  Administered 2016-01-29: 1 via ORAL
  Filled 2016-01-29: qty 1

## 2016-01-29 MED ORDER — SODIUM CHLORIDE 0.9 % IV SOLN
INTRAVENOUS | Status: DC
  Administered 2016-01-29 – 2016-01-30 (×2): via INTRAVENOUS

## 2016-01-29 MED ORDER — HYDROCODONE-ACETAMINOPHEN 5-325 MG PO TABS
1.0000 | ORAL_TABLET | ORAL | Status: DC | PRN
  Administered 2016-01-29 – 2016-01-30 (×5): 2 via ORAL
  Filled 2016-01-29: qty 1
  Filled 2016-01-29 (×3): qty 2
  Filled 2016-01-29: qty 1
  Filled 2016-01-29: qty 2

## 2016-01-29 MED ORDER — IPRATROPIUM BROMIDE 0.02 % IN SOLN
0.5000 mg | Freq: Once | RESPIRATORY_TRACT | Status: AC
  Administered 2016-01-29: 0.5 mg via RESPIRATORY_TRACT
  Filled 2016-01-29: qty 2.5

## 2016-01-29 MED ORDER — PREDNISONE 50 MG PO TABS
60.0000 mg | ORAL_TABLET | Freq: Every day | ORAL | Status: DC
  Administered 2016-01-29 – 2016-01-30 (×2): 60 mg via ORAL
  Filled 2016-01-29: qty 1
  Filled 2016-01-29: qty 3

## 2016-01-29 MED ORDER — ONDANSETRON HCL 4 MG/2ML IJ SOLN
4.0000 mg | Freq: Four times a day (QID) | INTRAMUSCULAR | Status: DC | PRN
  Administered 2016-01-29 – 2016-01-30 (×3): 4 mg via INTRAVENOUS
  Filled 2016-01-29 (×3): qty 2

## 2016-01-29 MED ORDER — GUAIFENESIN ER 600 MG PO TB12
1200.0000 mg | ORAL_TABLET | Freq: Two times a day (BID) | ORAL | Status: DC
  Administered 2016-01-29 – 2016-01-30 (×2): 1200 mg via ORAL
  Filled 2016-01-29 (×2): qty 2

## 2016-01-29 MED ORDER — ENOXAPARIN SODIUM 60 MG/0.6ML ~~LOC~~ SOLN
50.0000 mg | SUBCUTANEOUS | Status: DC
  Administered 2016-01-29: 50 mg via SUBCUTANEOUS
  Filled 2016-01-29 (×2): qty 0.6

## 2016-01-29 MED ORDER — LEVALBUTEROL HCL 1.25 MG/0.5ML IN NEBU
1.2500 mg | INHALATION_SOLUTION | Freq: Four times a day (QID) | RESPIRATORY_TRACT | Status: DC
  Administered 2016-01-29 – 2016-01-30 (×3): 1.25 mg via RESPIRATORY_TRACT
  Filled 2016-01-29 (×4): qty 0.5

## 2016-01-29 MED ORDER — ACETAMINOPHEN 650 MG RE SUPP: 650.0000 mg | Freq: Four times a day (QID) | RECTAL | Status: DC | PRN

## 2016-01-29 MED ORDER — ALBUTEROL SULFATE (2.5 MG/3ML) 0.083% IN NEBU
INHALATION_SOLUTION | RESPIRATORY_TRACT | Status: AC
  Filled 2016-01-29: qty 6

## 2016-01-29 MED ORDER — ACETAMINOPHEN 325 MG PO TABS: 650.0000 mg | ORAL_TABLET | Freq: Four times a day (QID) | ORAL | Status: DC | PRN

## 2016-01-29 MED ORDER — OSELTAMIVIR PHOSPHATE 75 MG PO CAPS
75.0000 mg | ORAL_CAPSULE | Freq: Two times a day (BID) | ORAL | Status: DC
  Administered 2016-01-29 – 2016-01-30 (×3): 75 mg via ORAL
  Filled 2016-01-29 (×4): qty 1

## 2016-01-29 MED ORDER — BISACODYL 10 MG RE SUPP: 10.0000 mg | Freq: Every day | RECTAL | Status: DC | PRN

## 2016-01-29 MED ORDER — SENNOSIDES-DOCUSATE SODIUM 8.6-50 MG PO TABS: 1.0000 | ORAL_TABLET | Freq: Every evening | ORAL | Status: DC | PRN

## 2016-01-29 MED ORDER — ALBUTEROL (5 MG/ML) CONTINUOUS INHALATION SOLN
10.0000 mg/h | INHALATION_SOLUTION | Freq: Once | RESPIRATORY_TRACT | Status: AC
  Administered 2016-01-29: 10 mg/h via RESPIRATORY_TRACT
  Filled 2016-01-29: qty 20
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
## 2016-01-29 NOTE — ED Notes (Signed)
Ordered pt a breakfast tray.

## 2016-01-29 NOTE — H&P (Signed)
[facility] History and Physical  Adrienne Garcia [id number] DOB: November 06, 1966 DOA: 01/29/2016  Referring physician: Emergency Department PCP: No PCP Per Patient   CHIEF COMPLAINT:                   HPI: Adrienne Garcia is a 50 y.o. female with a history of asthma, prior smoking, depression, anxiety, and PTSD admitted a few days ago with acute bronchitis, asthma exacerbation and influenza B. She been sick with an upper respiratory infection for 2 weeks. She reported fevers / cough / wheezing and chest pain worse with coughing. Patient was started on antibiotics, nebulizers and steroids.  Influenza B was positive. She was started on Tamiflu, steroids then tapered. As part of initial workup for chest pain and echocardiogram was ordered but patient declined as she was feeling better. Two days ago patient signed out AMA. She did receive a prescription for Tamiflu and prednisone but was unable to afford the Tamiflu. Patient took 2 doses of prednisone, she was using inhalers but came to ED for unrelieved symptoms  She returned to the emergency department early this a.m. With recurrent shortness of breath, nonproductive cough, and wheezing. No acute process is on chest x-ray.   ED COURSE:   Patient  has received steroids, several breathing treatments without improvement in her wheezing or shortness of breath . VSS.        Labs:   CBC unremarkable Chem profile unremarkable Troponin normal   EKG:    Sinus rhythm Borderline short PR interval artifact noted No significant change since last tracing Confirmed by Christy Gentles MD, DONALD ([id number]) on 01/29/2016 3:19:18 AM                   Medications  albuterol (PROVENTIL) (2.5 MG/3ML) 0.083% nebulizer solution 5 mg (5 mg Nebulization Given 01/29/16 0335)  albuterol (PROVENTIL) (2.5 MG/3ML) 0.083% nebulizer solution 5 mg (5 mg Nebulization Given 01/29/16 0431)  ipratropium (ATROVENT) nebulizer solution 0.5 mg (0.5 mg Nebulization Given 01/29/16 0431)    methylPREDNISolone sodium succinate (SOLU-MEDROL) 125 mg/2 mL injection 125 mg (125 mg Intravenous Given 01/29/16 0431)  oxyCODONE-acetaminophen (PERCOCET/ROXICET) 5-325 MG per tablet 1 tablet (1 tablet Oral Given 01/29/16 0430)  albuterol (PROVENTIL,VENTOLIN) solution continuous neb (10 mg/hr Nebulization Given 01/29/16 0523)  fentaNYL (SUBLIMAZE) injection 100 mcg (100 mcg Intravenous Given 01/29/16 0517)  albuterol (PROVENTIL) (2.5 MG/3ML) 0.083% nebulizer solution 5 mg (5 mg Nebulization Given 01/29/16 0844)    Review of Systems  Constitutional: Positive for chills and malaise/fatigue.  HENT: Positive for congestion.   Eyes: Negative.   Respiratory: Positive for cough, shortness of breath and wheezing.   Cardiovascular: Positive for chest pain.  Gastrointestinal: Negative.   Genitourinary: Negative.   Musculoskeletal: Negative.   Skin: Negative.   Neurological: Negative.   Endo/Heme/Allergies: Negative.   Psychiatric/Behavioral: Negative.     Past Medical History  Diagnosis Date  . Thyroid disease   . Depression   . Anxiety   . PTSD (post-traumatic stress disorder)   . Intractable nausea and vomiting   . Obesity   . Asthma   . Anemia   . Daily headache   . Migraine     "probably twice/month" (09/19/2015)  . Fibromyalgia   . Bipolar disorder ([place])   . Basal cell carcinoma of neck     "front of my neck; it was a melanoma"   Past Surgical History  Procedure Laterality Date  . Multiple tooth extractions  ~ 2011  . Mole removal  ~  2013    "front side of my neck"    SOCIAL HISTORY:  reports that she has quit smoking. Her smoking use included Cigarettes. She has a 14 pack-year smoking history. She does not have any smokeless tobacco history on file. She reports that she uses illicit drugs (Marijuana). She reports that she does not drink alcohol. Lives: At home with boyfriend     Assistive devices:   None needed for ambulation.   Allergies  Allergen Reactions  . Ciprofloxacin  Hives    FMH: mother CAD, sibling diabetes, hypertension  Prior to Admission medications   Medication Sig Start Date End Date Taking? Authorizing Provider  predniSONE (DELTASONE) 20 MG tablet Take 40mg  for 2days then 20mg  for 2days then STOP 01/28/16  Yes Domenic Polite, MD  albuterol (PROVENTIL HFA;VENTOLIN HFA) 108 (90 BASE) MCG/ACT inhaler Inhale 2 puffs into the lungs every 6 (six) hours as needed for wheezing or shortness of breath. Patient not taking: Reported on 01/26/2016 09/20/15   Geradine Girt, DO  oseltamivir (TAMIFLU) 75 MG capsule Take 1 capsule (75 mg total) by mouth 2 (two) times daily. For 4days 01/27/16   Domenic Polite, MD   PHYSICAL EXAMDanley Danker Vitals:   01/29/16 [id number] 01/29/16 0524 01/29/16 0631 01/29/16 0800  BP:   145/117 113/54  Pulse:   74   Temp: 98.6 F (37 C)     TempSrc: Oral     Resp:   20 20  SpO2:  93% 100%     Wt Readings from Last 3 Encounters:  01/27/16 105.325 kg (232 lb 3.2 oz)  01/22/16 106.142 kg (234 lb)  09/19/15 106.369 kg (234 lb 8 oz)    General:  Pleasant obese white  female. Appears calm and comfortable Eyes: PER, normal lids, irises & conjunctiva ENT: grossly normal hearing, lips & tongue Neck: no LAD, no masses Cardiovascular: RRR, no murmurs. No LE edema.  Respiratory: Respirations even and unlabored. Lateral upper and lower lobe expiratory wheezing, coarse breath sounds Abdomen: soft, non-distended, non-tender, active bowel sounds. No obvious masses.  Skin: no rash seen on limited exam Musculoskeletal: grossly normal tone BUE/BLE Psychiatric: grossly normal mood and affect, speech fluent and appropriate Neurologic: grossly non-focal.         LABS ON ADMISSION:    Basic Metabolic Panel:  Recent Labs Lab 01/22/16 1300 01/26/16 0155 01/26/16 1000 01/27/16 0408 01/29/16 0335  NA 141 141  --  137 141  K 4.0 3.9  --  3.9 3.8  CL 106 107  --  106 105  CO2 22 22  --  20* 23  GLUCOSE 123* 113*  --  136* 102*  BUN 9 10  --   11 18  CREATININE 0.66 0.65 0.79 0.63 0.74  CALCIUM 9.9 9.3  --  8.8* 9.4  MG  --   --  1.6*  --   --    Liver Function Tests:  Recent Labs Lab 01/27/16 0408  AST 18  ALT 19  ALKPHOS 76  BILITOT 0.4  PROT 5.9*  ALBUMIN 3.4*    CBC:  Recent Labs Lab 01/22/16 1300 01/26/16 0155 01/26/16 1000 01/27/16 0408 01/29/16 0335  WBC 6.2 4.8 5.2 7.1 9.8  HGB 13.6 13.3 12.0 11.5* 13.0  HCT 41.4 40.2 35.3* 34.9* 39.1  MCV 93.0 91.4 91.7 91.6 91.6  PLT 132* 123* 109* 110* 156    CREATININE: 0.74 (01/29/16 0335) Estimated creatinine clearance - 108.1 mL/min  Radiological Exams on Admission: Dg Chest 2 View  01/29/2016  CLINICAL DATA:  LEFT chest pain, shortness of breath. Symptoms have persisted since hospitalization last week, discharged yesterday. For are EXAM: CHEST  2 VIEW COMPARISON:  Chest radiograph January 26, 2016 FINDINGS: Cardiomediastinal silhouette is normal. The lungs are clear without pleural effusions or focal consolidations. Trachea projects midline and there is no pneumothorax. Soft tissue planes and included osseous structures are non-suspicious. IMPRESSION: No acute cardiopulmonary process, stable from January 26, 2016. Electronically Signed   By: Elon Alas M.D.   On: 01/29/2016 04:34    ASSESSMENT / PLAN   Acute bronchitis with asthma with acute exacerbation -admit to Medical bed -Prednisone, scheduled Nebs with prn Q2 albuterol neb -mucinex -02 prn    Influenza B. She got one day of treatment during recent admission. Couldn't afford med upon discharge.  -Resume tamiflu today  CONSULTANTS:   none Code Status: full code DVT Prophylaxis: Lovenox  Family Communication:  Patient alert, oriented and understands plan of care.  Disposition Plan: Discharge to home in 24-48 hours   Time spent: 60 minutes Tye Savoy  NP [facility] Pager [phone]

## 2016-01-29 NOTE — ED Notes (Signed)
Pt refused alb treatment at this time due to feeling shaky.

## 2016-01-29 NOTE — ED Notes (Signed)
PT declines cardiac lunch tray, per nutritional services, pt does not eat pasta, Service Response notified & grilled chicken, green beans & mashed potatoes

## 2016-01-29 NOTE — Progress Notes (Signed)
Report received from [name] in the ED. Pt. Oriented to unit. Vital signs stable and pt. Is resting.

## 2016-01-29 NOTE — ED Notes (Signed)
Attempted to call report to 5W 

## 2016-01-29 NOTE — ED Notes (Signed)
Pt ambulated to restroom. 

## 2016-01-29 NOTE — ED Notes (Addendum)
Pt presents from home with LEFT sided CP that does not radiate; pt also reports SOB; pt states these symptoms have persisted since hospitalization in the last week; pt reports discharged yesterday and has not had breathing treatments; pt reports lives with boyfriend who smokes a lot; pt now crying stating "I think Im having a panic attack"

## 2016-01-29 NOTE — ED Provider Notes (Signed)
CSN: [id number]     Arrival date & time 01/29/16  0307 History   By signing my name below, I, Nicole Kindred, attest that this documentation has been prepared under the direction and in the presence of No att. providers found.   Electronically Signed: Nicole Kindred, ED Scribe. 01/29/2016. 3:58 AM    Chief Complaint  Patient presents with  . Chest Pain  . Shortness of Breath    Patient is a 50 y.o. female presenting with chest pain and shortness of breath. The history is provided by the patient. No language interpreter was used.  Chest Pain Pain quality: sharp   Pain severity:  Mild Onset quality:  Gradual Timing:  Constant Progression:  Unchanged Chronicity:  New Relieved by:  Nothing Worsened by:  Coughing Associated symptoms: cough and shortness of breath   Associated symptoms: no nausea   Shortness of Breath Associated symptoms: chest pain and cough    HPI Comments: Adrienne Garcia is a 50 y.o. female who presents to the Emergency Department complaining of gradual onset, constant shortness of breath, onset earlier tonight. Pt reports associated coughing and non-radiating chest pain that worsens with coughing. No other worsening or alleviating factors noted. Pt denies hematemesis, diarrhea, nausea, or any other pertinent symptoms. Pt was discharged from the hospital yesterday where she was seen for flu like symptoms and told to come back if her symptoms did not improve.    Past Medical History  Diagnosis Date  . Thyroid disease   . Depression   . Anxiety   . PTSD (post-traumatic stress disorder)   . Intractable nausea and vomiting   . Obesity   . Asthma   . Anemia   . Daily headache   . Migraine     "probably twice/month" (09/19/2015)  . Fibromyalgia   . Bipolar disorder ([place])   . Basal cell carcinoma of neck     "front of my neck; it was a melanoma"   Past Surgical History  Procedure Laterality Date  . Multiple tooth extractions  ~ 2011  . Mole removal  ~ 2013     "front side of my neck"   No family history on file. Social History  Substance Use Topics  . Smoking status: Former Smoker -- 0.50 packs/day for 28 years    Types: Cigarettes  . Smokeless tobacco: Not on file     Comment: "quit smoking ~ 2014"  . Alcohol Use: No   OB History    No data available     Review of Systems  Respiratory: Positive for cough and shortness of breath.   Cardiovascular: Positive for chest pain.  Gastrointestinal: Negative for nausea and diarrhea.  All other systems reviewed and are negative.    Allergies  Ciprofloxacin  Home Medications   Prior to Admission medications   Medication Sig Start Date End Date Taking? Authorizing Provider  albuterol (PROVENTIL HFA;VENTOLIN HFA) 108 (90 BASE) MCG/ACT inhaler Inhale 2 puffs into the lungs every 6 (six) hours as needed for wheezing or shortness of breath. Patient not taking: Reported on 01/26/2016 09/20/15   Geradine Girt, DO  oseltamivir (TAMIFLU) 75 MG capsule Take 1 capsule (75 mg total) by mouth 2 (two) times daily. For 4days 01/27/16   Domenic Polite, MD  predniSONE (DELTASONE) 20 MG tablet Take 40mg  for 2days then 20mg  for 2days then STOP 01/28/16   Domenic Polite, MD   BP 145/117 mmHg  Pulse 74  Temp(Src) 98.6 F (37 C) (Oral)  Resp 20  SpO2 100%  LMP 12/16/2011 Physical Exam CONSTITUTIONAL: Well developed/well nourished. Anxious  HEAD: Normocephalic/atraumatic EYES: EOMI/PERRL ENMT: Mucous membranes moist NECK: supple no meningeal signs SPINE/BACK:entire spine nontender CV: S1/S2 noted, no murmurs/rubs/gallops noted LUNGS: Diffuse, coarse wheezing bilaterally. Diffuse TTP to chest wall. ABDOMEN: soft, nontender, no rebound or guarding, bowel sounds noted throughout abdomen GU:no cva tenderness NEURO: Pt is awake/alert/appropriate, moves all extremitiesx4.  No facial droop.   EXTREMITIES: pulses normal/equal, full ROM SKIN: warm, color normal PSYCH: Anxious   ED Course  Procedures   CRITICAL CARE Performed by: Sharyon Cable Total critical care time: 32 minutes Critical care time was exclusive of separately billable procedures and treating other patients. Critical care was necessary to treat or prevent imminent or life-threatening deterioration. Critical care was time spent personally by me on the following activities: development of treatment plan with patient and/or surrogate as well as nursing, discussions with consultants, evaluation of patient's response to treatment, examination of patient, obtaining history from patient or surrogate, ordering and performing treatments and interventions, ordering and review of laboratory studies, ordering and review of radiographic studies, pulse oximetry and re-evaluation of patient's condition. PATIENT RECEIVED MULTIPLE ALBUTEROL NEBULIZED TREATMENTS AND STILL WITH WHEEZING AND ALSO DYSPNEIC ON EXERTION DIAGNOSTIC STUDIES: Oxygen Saturation is 98% on RA, normal by my interpretation.    COORDINATION OF CARE: 3:55 AM-Discussed treatment plan which includes EKG, Proventil, and atrovent with pt at bedside and pt agreed to plan.  7:31 AM Pt observed for several hours She was given multiple treatments and still with wheezing  She apparently left AMA after a day in the hospital and would benefit from further inpatient care D/w [facility] for admission  Labs Review Labs Reviewed  BASIC METABOLIC PANEL - Abnormal; Notable for the following:    Glucose, Bld 102 (*)    All other components within normal limits  CBC  I-STAT TROPOININ, ED    Imaging Review Dg Chest 2 View  01/29/2016  CLINICAL DATA:  LEFT chest pain, shortness of breath. Symptoms have persisted since hospitalization last week, discharged yesterday. For are EXAM: CHEST  2 VIEW COMPARISON:  Chest radiograph January 26, 2016 FINDINGS: Cardiomediastinal silhouette is normal. The lungs are clear without pleural effusions or focal consolidations. Trachea projects midline and  there is no pneumothorax. Soft tissue planes and included osseous structures are non-suspicious. IMPRESSION: No acute cardiopulmonary process, stable from January 26, 2016. Electronically Signed   By: Elon Alas M.D.   On: 01/29/2016 04:34   I have personally reviewed and evaluated these images and lab results as part of my medical decision-making.   EKG Interpretation   Date/Time:  Wednesday January 29 2016 03:16:11 EST Ventricular Rate:  55 PR Interval:  119 QRS Duration: 104 QT Interval:  491 QTC Calculation: 470 R Axis:   83 Text Interpretation:  Sinus rhythm Borderline short PR interval artifact  noted No significant change since last tracing Confirmed by Christy Gentles  MD,  Zaydin Billey ([id number]) on 01/29/2016 3:19:18 AM     Medications  albuterol (PROVENTIL) (2.5 MG/3ML) 0.083% nebulizer solution 5 mg (not administered)  albuterol (PROVENTIL) (2.5 MG/3ML) 0.083% nebulizer solution 5 mg (5 mg Nebulization Given 01/29/16 0335)  albuterol (PROVENTIL) (2.5 MG/3ML) 0.083% nebulizer solution 5 mg (5 mg Nebulization Given 01/29/16 0431)  ipratropium (ATROVENT) nebulizer solution 0.5 mg (0.5 mg Nebulization Given 01/29/16 0431)  methylPREDNISolone sodium succinate (SOLU-MEDROL) 125 mg/2 mL injection 125 mg (125 mg Intravenous Given 01/29/16 0431)  oxyCODONE-acetaminophen (PERCOCET/ROXICET) 5-325 MG per tablet 1 tablet (1  tablet Oral Given 01/29/16 0430)  albuterol (PROVENTIL,VENTOLIN) solution continuous neb (10 mg/hr Nebulization Given 01/29/16 0523)  fentaNYL (SUBLIMAZE) injection 100 mcg (100 mcg Intravenous Given 01/29/16 0517)    MDM   Final diagnoses:  Status asthmaticus, unspecified asthma severity  Influenza    Nursing notes including past medical history and social history reviewed and considered in documentation xrays/imaging reviewed by myself and considered during evaluation Labs/vital reviewed myself and considered during evaluation   I personally performed the services described in  this documentation, which was scribed in my presence. The recorded information has been reviewed and is accurate.       Ripley Fraise, MD 01/29/16 [phone]

## 2016-01-30 DIAGNOSIS — J101 Influenza due to other identified influenza virus with other respiratory manifestations: Principal | ICD-10-CM

## 2016-01-30 DIAGNOSIS — J111 Influenza due to unidentified influenza virus with other respiratory manifestations: Secondary | ICD-10-CM

## 2016-01-30 DIAGNOSIS — J45901 Unspecified asthma with (acute) exacerbation: Secondary | ICD-10-CM

## 2016-01-30 DIAGNOSIS — J45902 Unspecified asthma with status asthmaticus: Secondary | ICD-10-CM

## 2016-01-30 DIAGNOSIS — J209 Acute bronchitis, unspecified: Secondary | ICD-10-CM

## 2016-01-30 MED ORDER — OSELTAMIVIR PHOSPHATE 75 MG PO CAPS: 75.0000 mg | ORAL_CAPSULE | Freq: Two times a day (BID) | ORAL | Status: DC

## 2016-01-30 MED ORDER — ALBUTEROL SULFATE HFA 108 (90 BASE) MCG/ACT IN AERS: 2.0000 | INHALATION_SPRAY | Freq: Four times a day (QID) | RESPIRATORY_TRACT | Status: DC | PRN

## 2016-01-30 MED ORDER — GUAIFENESIN ER 600 MG PO TB12: 1200.0000 mg | ORAL_TABLET | Freq: Two times a day (BID) | ORAL | Status: DC

## 2016-01-30 MED FILL — VENTOLIN HFA 90 MCG INHALER: 108 (90 BAS | 25 days supply | Qty: 18 | Fill #0

## 2016-01-30 MED FILL — OSELTAMIVIR PHOS 75 MG CAP: 75 | 4 days supply | Qty: 8 | Fill #0

## 2016-01-30 NOTE — Care Management Note (Signed)
Case Management Note  Patient Details  Name: Adrienne Garcia MRN: [id number] Date of Birth: [birth year]  Subjective/Objective:               From home, readmitted after being home for 2 days. COPD and FLU.      Action/Plan:  Apt made at [facility], tamiflu available for patient. CM asked MD to fax Rx to [facility] for patient to get today after discharge.  Expected Discharge Date:                  Expected Discharge Plan:  Home/Self Care  In-House Referral:     Discharge planning Services  CM Consult, [facility]  Post Acute Care Choice:    Choice offered to:  Patient  DME Arranged:    DME Agency:     HH Arranged:    [place] Agency:     Status of Service:  Completed, signed off  Medicare Important Message Given:    Date Medicare IM Given:    Medicare IM give by:    Date Additional Medicare IM Given:    Additional Medicare Important Message give by:     If discussed at [place] of Stay Meetings, dates discussed:    Additional Comments:  Carles Collet, RN 01/30/2016, 11:36 AM

## 2016-01-30 NOTE — Discharge Summary (Signed)
Physician Discharge Summary  Adrienne Garcia MRN: [id number] DOB/AGE: Jan 08, 1966 50 y.o.  PCP: No PCP Per Patient   Admit date: 01/29/2016 Discharge date: 01/30/2016  Discharge Diagnoses:     Active Problems:   Acute bronchitis with asthma with acute exacerbation   Influenza B   Influenza    Follow-up recommendations Follow-up with PCP in 3-5 days , including all  additional recommended appointments as below Follow-up CBC, CMP in 3-5 days      Medication List    TAKE these medications        albuterol 108 (90 Base) MCG/ACT inhaler  Commonly known as:  PROVENTIL HFA;VENTOLIN HFA  Inhale 2 puffs into the lungs every 6 (six) hours as needed for wheezing or shortness of breath.     albuterol 108 (90 Base) MCG/ACT inhaler  Commonly known as:  PROVENTIL HFA;VENTOLIN HFA  Inhale 2 puffs into the lungs every 6 (six) hours as needed for wheezing or shortness of breath.     guaiFENesin 600 MG 12 hr tablet  Commonly known as:  MUCINEX  Take 2 tablets (1,200 mg total) by mouth 2 (two) times daily.     oseltamivir 75 MG capsule  Commonly known as:  TAMIFLU  Take 1 capsule (75 mg total) by mouth 2 (two) times daily. For 4days     predniSONE 20 MG tablet  Commonly known as:  DELTASONE  Take 65m for 2days then 284mfor 2days then STOP         Discharge Condition: Stable   Discharge Instructions       Discharge Instructions    Diet - low sodium heart healthy    Complete by:  As directed      Diet - low sodium heart healthy    Complete by:  As directed      Increase activity slowly    Complete by:  As directed      Increase activity slowly    Complete by:  As directed            Allergies  Allergen Reactions  . Ciprofloxacin Hives      Disposition: 07-Left Against Medical Advice   Consults: *      Significant Diagnostic Studies:  Dg Chest 2 View  01/29/2016  CLINICAL DATA:  LEFT chest pain, shortness of breath. Symptoms have persisted since  hospitalization last week, discharged yesterday. For are EXAM: CHEST  2 VIEW COMPARISON:  Chest radiograph January 26, 2016 FINDINGS: Cardiomediastinal silhouette is normal. The lungs are clear without pleural effusions or focal consolidations. Trachea projects midline and there is no pneumothorax. Soft tissue planes and included osseous structures are non-suspicious. IMPRESSION: No acute cardiopulmonary process, stable from January 26, 2016. Electronically Signed   By: CoElon Alas.D.   On: 01/29/2016 04:34   Dg Chest 2 View  01/26/2016  CLINICAL DATA:  Acute onset of generalized chest pain, radiating to the left arm. Syncope and cough. Initial encounter. EXAM: CHEST  2 VIEW COMPARISON:  Chest radiograph performed 01/22/2016 FINDINGS: The lungs are well-aerated and clear. There is no evidence of focal opacification, pleural effusion or pneumothorax. The heart is normal in size; the mediastinal contour is within normal limits. No acute osseous abnormalities are seen. IMPRESSION: No acute cardiopulmonary process seen. Electronically Signed   By: JeGarald Balding.D.   On: 01/26/2016 02:07   Dg Chest 2 View  01/22/2016  CLINICAL DATA:  Chest pain about the sternum and right arm pain for 2 weeks. Initial  encounter. EXAM: CHEST  2 VIEW COMPARISON:  Single view of the chest 09/20/2015. PA and lateral chest 06/27/2014. FINDINGS: The lungs are clear. Heart size is normal. There is no pneumothorax or pleural effusion. No focal bony abnormality is identified. IMPRESSION: No acute disease. Electronically Signed   By: Inge Rise M.D.   On: 01/22/2016 13:09        Filed Weights   01/29/16 1535  Weight: 105.235 kg (232 lb)     Microbiology: Recent Results (from the past 240 hour(s))  Respiratory virus panel     Status: Abnormal   Collection Time: 01/26/16  8:50 AM  Result Value Ref Range Status   Source - RVPAN NASOPHARYNGEAL  Corrected   Respiratory Syncytial Virus A Negative Negative  Final   Respiratory Syncytial Virus B Negative Negative Final   Influenza A Negative Negative Final   Influenza B Positive (A) Negative Final   Parainfluenza 1 Negative Negative Final   Parainfluenza 2 Negative Negative Final   Parainfluenza 3 Negative Negative Final   Metapneumovirus Negative Negative Final   Rhinovirus Negative Negative Final   Adenovirus Negative Negative Final    Comment: (NOTE) Performed At: [facility] [place], [place] [id number] Lindon Romp MD [id number]   MRSA PCR Screening     Status: None   Collection Time: 01/26/16  3:28 PM  Result Value Ref Range Status   MRSA by PCR NEGATIVE NEGATIVE Final    Comment:        The GeneXpert MRSA Assay (FDA approved for NASAL specimens only), is one component of a comprehensive MRSA colonization surveillance program. It is not intended to diagnose MRSA infection nor to guide or monitor treatment for MRSA infections.        Blood Culture    Component Value Date/Time   SDES ABSCESS HEAD 01/13/2010 0527   SPECREQUEST NONE 01/13/2010 0527   CULT  01/13/2010 0527    MODERATE METHICILLIN RESISTANT STAPHYLOCOCCUS AUREUS Note: RIFAMPIN AND GENTAMICIN SHOULD NOT BE USED AS SINGLE DRUGS FOR TREATMENT OF STAPH INFECTIONS. This organism DOES NOT demonstrate inducible Clindamycin resistance in vitro. CRITICAL RESULT CALLED TO, READ BACK BY AND VERIFIED WITH: JEWEL STEWART  01/15/10 1010 BY SMITHERSJ   REPTSTATUS 01/15/2010 FINAL 01/13/2010 0527      Labs: Results for orders placed or performed during the hospital encounter of 01/29/16 (from the past 48 hour(s))  Basic metabolic panel     Status: Abnormal   Collection Time: 01/29/16  3:35 AM  Result Value Ref Range   Sodium 141 135 - 145 mmol/L   Potassium 3.8 3.5 - 5.1 mmol/L   Chloride 105 101 - 111 mmol/L   CO2 23 22 - 32 mmol/L   Glucose, Bld 102 (H) 65 - 99 mg/dL   BUN 18 6 - 20 mg/dL   Creatinine, Ser 0.74 0.44 - 1.00 mg/dL    Calcium 9.4 8.9 - 10.3 mg/dL   GFR calc non Af Amer >60 >60 mL/min   GFR calc Af Amer >60 >60 mL/min    Comment: (NOTE) The eGFR has been calculated using the CKD EPI equation. This calculation has not been validated in all clinical situations. eGFR's persistently <60 mL/min signify possible Chronic Kidney Disease.    Anion gap 13 5 - 15  CBC     Status: None   Collection Time: 01/29/16  3:35 AM  Result Value Ref Range   WBC 9.8 4.0 - 10.5 K/uL   RBC 4.27 3.87 -  5.11 MIL/uL   Hemoglobin 13.0 12.0 - 15.0 g/dL   HCT 39.1 36.0 - 46.0 %   MCV 91.6 78.0 - 100.0 fL   MCH 30.4 26.0 - 34.0 pg   MCHC 33.2 30.0 - 36.0 g/dL   RDW 12.9 11.5 - 15.5 %   Platelets 156 150 - 400 K/uL  I-stat troponin, ED (not at [facility], [facility])     Status: None   Collection Time: 01/29/16  4:09 AM  Result Value Ref Range   Troponin i, poc 0.00 0.00 - 0.08 ng/mL   Comment 3            Comment: Due to the release kinetics of cTnI, a negative result within the first hours of the onset of symptoms does not rule out myocardial infarction with certainty. If myocardial infarction is still suspected, repeat the test at appropriate intervals.      Lipid Panel  No results found for: CHOL, TRIG, HDL, CHOLHDL, VLDL, LDLCALC, LDLDIRECT   Lab Results  Component Value Date   HGBA1C 5.5 01/26/2016     Lab Results  Component Value Date   CREATININE 0.74 01/29/2016     HPI :Adrienne Garcia is a 50 y.o. female with a history of asthma, prior smoking, depression, anxiety, and PTSD admitted a few days ago with acute bronchitis, asthma exacerbation and influenza B. She been sick with an upper respiratory infection for 2 weeks. She reported fevers / cough / wheezing and chest pain worse with coughing. Patient was started on antibiotics, nebulizers and steroids. Influenza B was positive. She was started on Tamiflu, steroids then tapered. As part of initial workup for chest pain and echocardiogram was ordered but patient  declined as she was feeling better. Two days ago patient signed out AMA. She did receive a prescription for Tamiflu and prednisone but was unable to afford the Tamiflu. Patient took 2 doses of prednisone, she was using inhalers but came to ED for unrelieved symptoms   HOSPITAL COURSE:  Acute bronchitis with asthma with acute exacerbation, influenza B Chest pain is atypical, likely noncardiac, cardiac enzymes have been negative, EKG within normal limits Patient is  admitted for acute asthmatic bronchitis, initiated on IV steroids, empiric antibiotics, nebulizer treatments, now transition to prednisone taper Chest pain relieved with Toradol and narcotics, currently chest pain-free Positive influenza PCR, started on Tamiflu, will need 4 more days of Tamiflu    Intractable nausea and vomiting, likely related to his coughing spells Use cough suppressants, Zofran as needed, over-the-counter cough syrup   Thrombocytopenia (HCC)-likely idiopathic vs related to viral infection,follow , improving   Obesity Body mass index is 36.01 kg/(m^2).   Depression-denies any suicidal ideation, appears to be stable    Discharge Exam: *   Blood pressure 140/84, pulse 53, temperature 98.3 F (36.8 C), temperature source Oral, resp. rate 17, height 5' 8"  (1.727 m), weight 105.235 kg (232 lb), last menstrual period 12/16/2011, SpO2 98 %.  Cardiovascular: RRR, S1 normal, S2 normal, no MRG, pulses symmetric and intact bilaterally  Pulmonary/Chest: CTAB, She has wheezes. She has rhonchi, Bilateral costochondral tenderness that reproduces her complaints of pain Abdominal: Soft. Non-tender, non-distended, bowel sounds are normal, no masses, organomegaly, or guarding present.  GU: no CVA tenderness Musculoskeletal: No joint deformities, erythema, or stiffness, ROM full and no nontender Ext: no edema and no cyanosis, pulses palpable bilaterally (DP and PT)  Hematology: no cervical, inginal, or axillary  adenopathy.  Neurological: A&O x3, Strenght is normal and symmetric bilaterally, cranial  nerve II-XII are grossly intact, no focal motor deficit, sensory intact to light touch bilaterally.     Follow-up Information    Follow up with [facility] On 01/31/2016.   Why:  at2:00. Please arrive 10 minutes early and bring your photo ID.   Contact information:   [address] [place] [address] [phone]      Follow up with TAM[facility].   Why:  You need to pick this up today. They are open until 5:30, closed from 1-2pm for lunch   Contact information:   Is at [place]       Follow up with PCP. Schedule an appointment as soon as possible for a visit in 1 week.      SignedReyne Dumas 01/30/2016, 11:46 AM        Time spent >45 mins

## 2016-01-30 NOTE — Progress Notes (Signed)
Patient discharge teaching given, including activity, diet, follow-up appoints, and medications. Patient verbalized understanding of all discharge instructions. IV access was d/c'd. Vitals are stable. Skin is intact except as charted in most recent assessments. Pt to be escorted out by NT, to be driven home by family. 

## 2016-01-31 ENCOUNTER — Inpatient Hospital Stay: Payer: Self-pay

## 2016-02-05 NOTE — Discharge Summary (Signed)
Physician Discharge Summary  Adrienne Garcia [id number] DOB: Oct 25, 1966 DOA: 01/26/2016  PCP: No PCP Per Patient  Admit date: 01/26/2016 Discharge date: 01/27/2016  Time spent: 45 minutes  Recommendations for Outpatient Follow-up:  LEFT AMA   Discharge Diagnoses:    Influenza A   Acute bronchitis with asthma with acute exacerbation   Intractable nausea and vomiting   Thrombocytopenia ([place])   Obesity   Depression   Acute bronchitis with bronchospasm   Filed Weights   01/26/16 0145 01/26/16 1200 01/27/16 0344  Weight: 104.327 kg (230 lb) 105.6 kg (232 lb 12.9 oz) 105.325 kg (232 lb 3.2 oz)    History of present illness:  Chief Complaint: Shortness of breath HPI: 50 year old female with a history of depression, anxiety, PTSD, asthma, prior history of smoking who presented to the ER for chest pain. Patient has been sick for the last 2 weeks with a viral URI. Patient was in the ER on Tuesday, waited for about 9 hours, could not see a physician and went home. Continued to feel worse, with worsening chest tightness, worsening shortness of breath, wheezing. Subsequently developed left facial pain and left ear pain, fevers up to 103.1, associated with chills, productive cough and rhinorrhea  Hospital Course:  Asthma exacerbation/ influenza B -improving with Tamiflu, IV steroids, nebs -left AMA before she was medically ready for discharge  Pleuritic chest pain =due to #1, improved   Intractable nausea and vomiting,  -due to FLu, post tussive -improving, compazine PRN   Thrombocytopenia -due to FLu, monitor   Obesity  -life style modification.   Depression - stable  Discharge Exam: Filed Vitals:   01/27/16 0344 01/27/16 1149  BP: 151/74 102/71  Pulse: 43 62  Temp: 97.8 F (36.6 C) 97.9 F (36.6 C)  Resp: 18 16    General: AAOx3 Cardiovascular: S1S2/RRR tachycardic Respiratory: + wheezes, ronchi  Discharge Instructions    Discharge Medication List as  of 01/27/2016  3:25 PM    START taking these medications   Details  oseltamivir (TAMIFLU) 75 MG capsule Take 1 capsule (75 mg total) by mouth 2 (two) times daily. For 4days, Starting 01/27/2016, Until Discontinued, Print      CONTINUE these medications which have CHANGED   Details  predniSONE (DELTASONE) 20 MG tablet Take 40mg  for 2days then 20mg  for 2days then STOP, Print      CONTINUE these medications which have NOT CHANGED   Details  albuterol (PROVENTIL HFA;VENTOLIN HFA) 108 (90 BASE) MCG/ACT inhaler Inhale 2 puffs into the lungs every 6 (six) hours as needed for wheezing or shortness of breath., Starting 09/20/2015, Until Discontinued, Print      STOP taking these medications     doxycycline (VIBRA-TABS) 100 MG tablet      ondansetron (ZOFRAN) 4 MG tablet        Allergies  Allergen Reactions  . Ciprofloxacin Hives      The results of significant diagnostics from this hospitalization (including imaging, microbiology, ancillary and laboratory) are listed below for reference.    Significant Diagnostic Studies: Dg Chest 2 View  01/29/2016  CLINICAL DATA:  LEFT chest pain, shortness of breath. Symptoms have persisted since hospitalization last week, discharged yesterday. For are EXAM: CHEST  2 VIEW COMPARISON:  Chest radiograph January 26, 2016 FINDINGS: Cardiomediastinal silhouette is normal. The lungs are clear without pleural effusions or focal consolidations. Trachea projects midline and there is no pneumothorax. Soft tissue planes and included osseous structures are non-suspicious. IMPRESSION: No acute cardiopulmonary process, stable from  January 26, 2016. Electronically Signed   By: Elon Alas M.D.   On: 01/29/2016 04:34   Dg Chest 2 View  01/26/2016  CLINICAL DATA:  Acute onset of generalized chest pain, radiating to the left arm. Syncope and cough. Initial encounter. EXAM: CHEST  2 VIEW COMPARISON:  Chest radiograph performed 01/22/2016 FINDINGS: The lungs are  well-aerated and clear. There is no evidence of focal opacification, pleural effusion or pneumothorax. The heart is normal in size; the mediastinal contour is within normal limits. No acute osseous abnormalities are seen. IMPRESSION: No acute cardiopulmonary process seen. Electronically Signed   By: Garald Balding M.D.   On: 01/26/2016 02:07   Dg Chest 2 View  01/22/2016  CLINICAL DATA:  Chest pain about the sternum and right arm pain for 2 weeks. Initial encounter. EXAM: CHEST  2 VIEW COMPARISON:  Single view of the chest 09/20/2015. PA and lateral chest 06/27/2014. FINDINGS: The lungs are clear. Heart size is normal. There is no pneumothorax or pleural effusion. No focal bony abnormality is identified. IMPRESSION: No acute disease. Electronically Signed   By: Inge Rise M.D.   On: 01/22/2016 13:09    Microbiology: Recent Results (from the past 240 hour(s))  MRSA PCR Screening     Status: None   Collection Time: 01/26/16  3:28 PM  Result Value Ref Range Status   MRSA by PCR NEGATIVE NEGATIVE Final    Comment:        The GeneXpert MRSA Assay (FDA approved for NASAL specimens only), is one component of a comprehensive MRSA colonization surveillance program. It is not intended to diagnose MRSA infection nor to guide or monitor treatment for MRSA infections.      Labs: Basic Metabolic Panel: No results for input(s): NA, K, CL, CO2, GLUCOSE, BUN, CREATININE, CALCIUM, MG, PHOS in the last 168 hours. Liver Function Tests: No results for input(s): AST, ALT, ALKPHOS, BILITOT, PROT, ALBUMIN in the last 168 hours. No results for input(s): LIPASE, AMYLASE in the last 168 hours. No results for input(s): AMMONIA in the last 168 hours. CBC: No results for input(s): WBC, NEUTROABS, HGB, HCT, MCV, PLT in the last 168 hours. Cardiac Enzymes: No results for input(s): CKTOTAL, CKMB, CKMBINDEX, TROPONINI in the last 168 hours. BNP: BNP (last 3 results) No results for input(s): BNP in the last  8760 hours.  ProBNP (last 3 results) No results for input(s): PROBNP in the last 8760 hours.  CBG: No results for input(s): GLUCAP in the last 168 hours.     SignedDomenic Polite MD.  [facility] 02/05/2016, 3:00 PM

## 2016-04-22 MED FILL — VENTOLIN HFA 90 MCG INHALER: 108 (90 BAS | 25 days supply | Qty: 18 | Fill #1

## 2016-07-30 MED FILL — VENTOLIN HFA 90 MCG INHALER: 108 (90 BAS | 25 days supply | Qty: 18 | Fill #2

## 2016-11-11 ENCOUNTER — Other Ambulatory Visit: Payer: Self-pay

## 2016-11-11 MED ORDER — ALBUTEROL SULFATE HFA 108 (90 BASE) MCG/ACT IN AERS: 2.0000 | INHALATION_SPRAY | Freq: Four times a day (QID) | RESPIRATORY_TRACT | 3 refills | Status: DC | PRN

## 2016-11-11 MED ORDER — ALBUTEROL SULFATE HFA 108 (90 BASE) MCG/ACT IN AERS: 2.0000 | INHALATION_SPRAY | Freq: Four times a day (QID) | RESPIRATORY_TRACT | 2 refills | Status: DC | PRN

## 2017-03-12 MED FILL — !VENTOLIN HFA INHALER: 108 (90 BAS | 20 days supply | Qty: 18 | Fill #0

## 2017-04-05 ENCOUNTER — Ambulatory Visit: Payer: Self-pay | Attending: Internal Medicine

## 2017-04-05 MED FILL — !VENTOLIN HFA INHALER: 108 (90 BAS | 20 days supply | Qty: 18 | Fill #1

## 2017-04-30 ENCOUNTER — Encounter (HOSPITAL_COMMUNITY): Payer: Self-pay | Admitting: Emergency Medicine

## 2017-04-30 ENCOUNTER — Emergency Department (HOSPITAL_COMMUNITY)
Admission: EM | Admit: 2017-04-30 | Discharge: 2017-04-30 | Disposition: A | Discharge status: Home / Self Care | Payer: Self-pay | Attending: Emergency Medicine | Admitting: Emergency Medicine

## 2017-04-30 ENCOUNTER — Emergency Department (HOSPITAL_COMMUNITY): Payer: Self-pay

## 2017-04-30 DIAGNOSIS — Z87891 Personal history of nicotine dependence: Secondary | ICD-10-CM | POA: Insufficient documentation

## 2017-04-30 DIAGNOSIS — C4441 Basal cell carcinoma of skin of scalp and neck: Secondary | ICD-10-CM | POA: Insufficient documentation

## 2017-04-30 DIAGNOSIS — J45909 Unspecified asthma, uncomplicated: Secondary | ICD-10-CM | POA: Insufficient documentation

## 2017-04-30 DIAGNOSIS — J441 Chronic obstructive pulmonary disease with (acute) exacerbation: Secondary | ICD-10-CM | POA: Insufficient documentation

## 2017-04-30 LAB — CBC WITH DIFFERENTIAL/PLATELET
Basophils Absolute: 0 10*3/uL (ref 0.0–0.1)
Basophils Relative: 0 %
Eosinophils Absolute: 0 10*3/uL (ref 0.0–0.7)
Eosinophils Relative: 0 %
HCT: 38.2 % (ref 36.0–46.0)
Hemoglobin: 12.3 g/dL (ref 12.0–15.0)
LYMPHS PCT: 15 %
Lymphs Abs: 1.6 10*3/uL (ref 0.7–4.0)
MCH: 30.3 pg (ref 26.0–34.0)
MCHC: 32.2 g/dL (ref 30.0–36.0)
MCV: 94.1 fL (ref 78.0–100.0)
MONO ABS: 0.9 10*3/uL (ref 0.1–1.0)
MONOS PCT: 8 %
NEUTROS ABS: 7.9 10*3/uL — AB (ref 1.7–7.7)
Neutrophils Relative %: 77 %
Platelets: 155 10*3/uL (ref 150–400)
RBC: 4.06 MIL/uL (ref 3.87–5.11)
RDW: 13.4 % (ref 11.5–15.5)
WBC: 10.4 10*3/uL (ref 4.0–10.5)

## 2017-04-30 LAB — URINALYSIS, ROUTINE W REFLEX MICROSCOPIC
BILIRUBIN URINE: NEGATIVE
Glucose, UA: NEGATIVE mg/dL
Ketones, ur: NEGATIVE mg/dL
Nitrite: NEGATIVE
Protein, ur: 30 mg/dL — AB
SPECIFIC GRAVITY, URINE: 1.026 (ref 1.005–1.030)
pH: 5 (ref 5.0–8.0)

## 2017-04-30 LAB — BASIC METABOLIC PANEL
Anion gap: 8 (ref 5–15)
BUN: 12 mg/dL (ref 6–20)
CALCIUM: 9.1 mg/dL (ref 8.9–10.3)
CO2: 24 mmol/L (ref 22–32)
CREATININE: 0.68 mg/dL (ref 0.44–1.00)
Chloride: 107 mmol/L (ref 101–111)
GFR calc Af Amer: >60 mL/min (ref >60)
GFR calc non Af Amer: >60 mL/min (ref >60)
GLUCOSE: 113 mg/dL — AB (ref 65–99)
Potassium: 3.9 mmol/L (ref 3.5–5.1)
Sodium: 139 mmol/L (ref 135–145)

## 2017-04-30 MED ORDER — SODIUM CHLORIDE 0.9 % IV BOLUS (SEPSIS)
500.0000 mL | Freq: Once | INTRAVENOUS | Status: AC
  Administered 2017-04-30: 500 mL via INTRAVENOUS

## 2017-04-30 MED ORDER — PREDNISONE 20 MG PO TABS: 60.0000 mg | ORAL_TABLET | Freq: Every day | ORAL | 0 refills | Status: DC

## 2017-04-30 MED ORDER — KETOROLAC TROMETHAMINE 30 MG/ML IJ SOLN
30.0000 mg | Freq: Once | INTRAMUSCULAR | Status: AC
  Administered 2017-04-30: 30 mg via INTRAVENOUS
  Filled 2017-04-30: qty 1

## 2017-04-30 MED ORDER — ALBUTEROL SULFATE (2.5 MG/3ML) 0.083% IN NEBU
2.5000 mg | INHALATION_SOLUTION | Freq: Once | RESPIRATORY_TRACT | Status: AC
  Administered 2017-04-30: 2.5 mg via RESPIRATORY_TRACT
  Filled 2017-04-30: qty 3

## 2017-04-30 MED ORDER — METHYLPREDNISOLONE SODIUM SUCC 125 MG IJ SOLR
125.0000 mg | Freq: Once | INTRAMUSCULAR | Status: AC
  Administered 2017-04-30: 125 mg via INTRAVENOUS
  Filled 2017-04-30: qty 2

## 2017-04-30 MED ORDER — ALBUTEROL (5 MG/ML) CONTINUOUS INHALATION SOLN
10.0000 mg/h | INHALATION_SOLUTION | Freq: Once | RESPIRATORY_TRACT | Status: AC
  Administered 2017-04-30: 10 mg/h via RESPIRATORY_TRACT
  Filled 2017-04-30: qty 20

## 2017-04-30 MED ORDER — HYDROCODONE-HOMATROPINE 5-1.5 MG/5ML PO SYRP: 5.0000 mL | ORAL_SOLUTION | Freq: Four times a day (QID) | ORAL | 0 refills | Status: DC | PRN

## 2017-04-30 MED ORDER — ONDANSETRON HCL 4 MG/2ML IJ SOLN
4.0000 mg | Freq: Once | INTRAMUSCULAR | Status: AC
  Administered 2017-04-30: 4 mg via INTRAVENOUS
  Filled 2017-04-30: qty 2

## 2017-04-30 MED ORDER — IPRATROPIUM-ALBUTEROL 0.5-2.5 (3) MG/3ML IN SOLN
3.0000 mL | Freq: Once | RESPIRATORY_TRACT | Status: AC
  Administered 2017-04-30: 3 mL via RESPIRATORY_TRACT
  Filled 2017-04-30: qty 3

## 2017-04-30 MED ORDER — AMOXICILLIN 500 MG PO CAPS: 1000.0000 mg | ORAL_CAPSULE | Freq: Two times a day (BID) | ORAL | 0 refills | Status: DC

## 2017-04-30 MED ORDER — HYDROCODONE-ACETAMINOPHEN 5-325 MG PO TABS
1.0000 | ORAL_TABLET | Freq: Once | ORAL | Status: AC
  Administered 2017-04-30: 1 via ORAL
  Filled 2017-04-30: qty 1

## 2017-04-30 NOTE — ED Triage Notes (Signed)
Pt presents with generaliized illness x 4 days that progressed to difficulty breathing and shortness of breath with chest pain with cough yesterday

## 2017-04-30 NOTE — ED Provider Notes (Signed)
[facility] DEPT Provider Note   CSN: [id number] Arrival date & time: 04/30/17  0420     History   Chief Complaint Chief Complaint  Patient presents with  . Shortness of Breath    HPI Adrienne Garcia is a 51 y.o. female.  Patient presents to the ER for evaluation of cough, chest congestion, shortness of breath. Patient reports a history of asthma and COPD. She has required hospitalization for similar symptoms in the past. Patient reports cough productive of thick sputum. She has been using her Ventolin inhaler without improvement.      Past Medical History:  Diagnosis Date  . Anemia   . Anxiety   . Asthma   . Basal cell carcinoma of neck    "front of my neck; it was a melanoma"  . Bipolar disorder ([place])   . Daily headache   . Depression   . Fibromyalgia   . Intractable nausea and vomiting   . Migraine    "probably twice/month" (09/19/2015)  . Obesity   . PTSD (post-traumatic stress disorder)   . Thyroid disease     Patient Active Problem List   Diagnosis Date Noted  . Influenza B 01/29/2016  . Influenza 01/29/2016  . Status asthmaticus   . Bipolar affective disorder, currently manic, moderate ([place])   . Acute bronchitis with asthma with acute exacerbation 01/26/2016  . Acute bronchitis with bronchospasm 01/26/2016  . Bronchitis with bronchospasm   . Costochondritis, acute   . Intractable nausea and vomiting 09/19/2015  . Hyperglycemia 09/19/2015  . Leukocytosis 09/19/2015  . Thrombocytopenia ([place]) 09/19/2015  . Diarrhea 09/19/2015  . Obesity 09/19/2015  . Thyroid disease   . Depression     Past Surgical History:  Procedure Laterality Date  . MOLE REMOVAL  ~ 2013   "front side of my neck"  . MULTIPLE TOOTH EXTRACTIONS  ~ 2011    OB History    No data available       Home Medications    Prior to Admission medications   Medication Sig Start Date End Date Taking? Authorizing Provider  albuterol (PROVENTIL HFA;VENTOLIN HFA) 108 (90 Base)  MCG/ACT inhaler Inhale 2 puffs into the lungs every 6 (six) hours as needed for wheezing or shortness of breath. 11/11/16  Yes Arnoldo Morale, MD  albuterol (PROVENTIL HFA;VENTOLIN HFA) 108 (90 Base) MCG/ACT inhaler Inhale 2 puffs into the lungs every 6 (six) hours as needed for wheezing or shortness of breath. Patient not taking: Reported on 04/30/2017 11/11/16   Arnoldo Morale, MD  amoxicillin (AMOXIL) 500 MG capsule Take 2 capsules (1,000 mg total) by mouth 2 (two) times daily. 04/30/17   Atalia Litzinger, Gwenyth Allegra, MD  HYDROcodone-homatropine (HYCODAN) 5-1.5 MG/5ML syrup Take 5 mLs by mouth every 6 (six) hours as needed for cough. 04/30/17   Orpah Greek, MD  predniSONE (DELTASONE) 20 MG tablet Take 3 tablets (60 mg total) by mouth daily with breakfast. 04/30/17   Sebastian Dzik, Gwenyth Allegra, MD    Family History Family History  Problem Relation Age of Onset  . CAD Mother   . Hypertension Unknown        sibling  . Diabetes Unknown        sibling    Social History Social History  Substance Use Topics  . Smoking status: Former Smoker    Packs/day: 0.50    Years: 28.00    Types: Cigarettes  . Smokeless tobacco: Never Used     Comment: "quit smoking ~ 2014"  . Alcohol use  No     Allergies   Ciprofloxacin   Review of Systems Review of Systems  HENT: Positive for congestion.   Respiratory: Positive for cough, shortness of breath and wheezing.   All other systems reviewed and are negative.    Physical Exam Updated Vital Signs BP (!) 123/53   Pulse 72   Temp 98.1 F (36.7 C) (Oral)   Resp 18   LMP 12/16/2011   SpO2 96%   Physical Exam  Constitutional: She is oriented to person, place, and time. She appears well-developed and well-nourished. No distress.  HENT:  Head: Normocephalic and atraumatic.  Right Ear: Hearing normal.  Left Ear: Hearing normal.  Nose: Nose normal.  Mouth/Throat: Oropharynx is clear and moist and mucous membranes are normal.  Eyes: Conjunctivae  and EOM are normal. Pupils are equal, round, and reactive to light.  Neck: Normal range of motion. Neck supple.  Cardiovascular: Regular rhythm, S1 normal and S2 normal.  Exam reveals no gallop and no friction rub.   No murmur heard. Pulmonary/Chest: Effort normal. No respiratory distress. She has decreased breath sounds. She has wheezes. She exhibits no tenderness.  Abdominal: Soft. Normal appearance and bowel sounds are normal. There is no hepatosplenomegaly. There is no tenderness. There is no rebound, no guarding, no tenderness at McBurney's point and negative Murphy's sign. No hernia.  Musculoskeletal: Normal range of motion.  Neurological: She is alert and oriented to person, place, and time. She has normal strength. No cranial nerve deficit or sensory deficit. Coordination normal. GCS eye subscore is 4. GCS verbal subscore is 5. GCS motor subscore is 6.  Skin: Skin is warm, dry and intact. No rash noted. No cyanosis.  Psychiatric: She has a normal mood and affect. Her speech is normal and behavior is normal. Thought content normal.  Nursing note and vitals reviewed.    ED Treatments / Results  Labs (all labs ordered are listed, but only abnormal results are displayed) Labs Reviewed  CBC WITH DIFFERENTIAL/PLATELET - Abnormal; Notable for the following:       Result Value   Neutro Abs 7.9 (*)    All other components within normal limits  BASIC METABOLIC PANEL - Abnormal; Notable for the following:    Glucose, Bld 113 (*)    All other components within normal limits  URINALYSIS, ROUTINE W REFLEX MICROSCOPIC - Abnormal; Notable for the following:    APPearance HAZY (*)    Hgb urine dipstick MODERATE (*)    Protein, ur 30 (*)    Leukocytes, UA TRACE (*)    Bacteria, UA RARE (*)    Squamous Epithelial / LPF 0-5 (*)    All other components within normal limits    EKG  EKG Interpretation None       Radiology Dg Chest Port 1 View  Result Date: 04/30/2017 CLINICAL DATA:   Acute onset of shortness of breath. Initial encounter. EXAM: PORTABLE CHEST 1 VIEW COMPARISON:  Chest radiograph performed 01/29/2016 FINDINGS: The lungs are well-aerated and clear. There is no evidence of focal opacification, pleural effusion or pneumothorax. The cardiomediastinal silhouette is within normal limits. No acute osseous abnormalities are seen. IMPRESSION: No acute cardiopulmonary process seen. Electronically Signed   By: Garald Balding M.D.   On: 04/30/2017 06:09    Procedures Procedures (including critical care time)  Medications Ordered in ED Medications  ketorolac (TORADOL) 30 MG/ML injection 30 mg (not administered)  HYDROcodone-acetaminophen (NORCO/VICODIN) 5-325 MG per tablet 1 tablet (not administered)  ondansetron (ZOFRAN) injection  4 mg (not administered)  methylPREDNISolone sodium succinate (SOLU-MEDROL) 125 mg/2 mL injection 125 mg (125 mg Intravenous Given 04/30/17 0532)  ipratropium-albuterol (DUONEB) 0.5-2.5 (3) MG/3ML nebulizer solution 3 mL (3 mLs Nebulization Given 04/30/17 0457)  albuterol (PROVENTIL) (2.5 MG/3ML) 0.083% nebulizer solution 2.5 mg (2.5 mg Nebulization Given 04/30/17 0457)  sodium chloride 0.9 % bolus 500 mL (0 mLs Intravenous Stopped 04/30/17 0713)  albuterol (PROVENTIL,VENTOLIN) solution continuous neb (10 mg/hr Nebulization Given 04/30/17 0600)     Initial Impression / Assessment and Plan / ED Course  I have reviewed the triage vital signs and the nursing notes.  Pertinent labs & imaging results that were available during my care of the patient were reviewed by me and considered in my medical decision making (see chart for details).     Patient presented to the ER for evaluation of cough, chest congestion shortness of breath with wheezing. She did have moderate wheezing upon arrival. This improved with serial nebulizer treatments. Patient given Solu-Medrol. Chest x-ray clear, no evidence of pneumonia. Patient oxygenation is near 100% on room air. She  does not require hospitalization at this time. Patient reports significant improvement.  Final Clinical Impressions(s) / ED Diagnoses   Final diagnoses:  COPD exacerbation (HCC)    New Prescriptions New Prescriptions   AMOXICILLIN (AMOXIL) 500 MG CAPSULE    Take 2 capsules (1,000 mg total) by mouth 2 (two) times daily.   HYDROCODONE-HOMATROPINE (HYCODAN) 5-1.5 MG/5ML SYRUP    Take 5 mLs by mouth every 6 (six) hours as needed for cough.   PREDNISONE (DELTASONE) 20 MG TABLET    Take 3 tablets (60 mg total) by mouth daily with breakfast.     Orpah Greek, MD 04/30/17 [phone]

## 2017-04-30 NOTE — ED Notes (Signed)
Patient continues to complain of chest pain. EDP made aware.

## 2017-04-30 NOTE — ED Notes (Signed)
Patient stated that her breathing treatment was making her "too jittery and heart racing" so she took it off. RN notified.

## 2017-05-04 ENCOUNTER — Other Ambulatory Visit: Payer: Self-pay | Admitting: *Deleted

## 2017-05-04 MED ORDER — ALBUTEROL SULFATE HFA 108 (90 BASE) MCG/ACT IN AERS
2.0000 | INHALATION_SPRAY | Freq: Four times a day (QID) | RESPIRATORY_TRACT | 3 refills | Status: DC | PRN
Start: 2017-05-04 — End: 2017-07-07

## 2017-05-04 NOTE — Telephone Encounter (Signed)
PRINTED FOR PASS PROGRAM 

## 2017-07-05 ENCOUNTER — Emergency Department (HOSPITAL_COMMUNITY): Payer: Self-pay

## 2017-07-05 ENCOUNTER — Encounter (HOSPITAL_COMMUNITY): Payer: Self-pay | Admitting: Emergency Medicine

## 2017-07-05 DIAGNOSIS — Z5321 Procedure and treatment not carried out due to patient leaving prior to being seen by health care provider: Secondary | ICD-10-CM | POA: Insufficient documentation

## 2017-07-05 MED ORDER — ONDANSETRON 4 MG PO TBDP
4.0000 mg | ORAL_TABLET | Freq: Once | ORAL | Status: AC
  Administered 2017-07-05: 4 mg via ORAL
  Filled 2017-07-05: qty 1

## 2017-07-05 NOTE — ED Triage Notes (Signed)
Pt reports having chest pain and shortness of breath for the last 2 days. Pt also reports having sweating and vomiting.

## 2017-07-05 NOTE — ED Notes (Signed)
Unable to collect labs on patient .  Patient states she is unable to sit still

## 2017-07-06 ENCOUNTER — Emergency Department (HOSPITAL_COMMUNITY)
Admission: EM | Admit: 2017-07-06 | Discharge: 2017-07-06 | Discharge status: Against Medical Advice | Payer: Self-pay | Attending: Emergency Medicine | Admitting: Emergency Medicine

## 2017-07-06 NOTE — ED Notes (Signed)
No answer when called 

## 2017-07-07 ENCOUNTER — Encounter (HOSPITAL_COMMUNITY): Payer: Self-pay | Admitting: Emergency Medicine

## 2017-07-07 ENCOUNTER — Emergency Department (HOSPITAL_COMMUNITY): Payer: Self-pay

## 2017-07-07 ENCOUNTER — Other Ambulatory Visit: Payer: Self-pay

## 2017-07-07 ENCOUNTER — Emergency Department (HOSPITAL_COMMUNITY)
Admission: EM | Admit: 2017-07-07 | Discharge: 2017-07-07 | Disposition: A | Discharge status: Home / Self Care | Payer: Self-pay | Attending: Emergency Medicine | Admitting: Emergency Medicine

## 2017-07-07 DIAGNOSIS — J441 Chronic obstructive pulmonary disease with (acute) exacerbation: Secondary | ICD-10-CM | POA: Insufficient documentation

## 2017-07-07 DIAGNOSIS — K529 Noninfective gastroenteritis and colitis, unspecified: Secondary | ICD-10-CM | POA: Insufficient documentation

## 2017-07-07 DIAGNOSIS — R197 Diarrhea, unspecified: Secondary | ICD-10-CM

## 2017-07-07 DIAGNOSIS — E876 Hypokalemia: Secondary | ICD-10-CM | POA: Insufficient documentation

## 2017-07-07 DIAGNOSIS — R112 Nausea with vomiting, unspecified: Secondary | ICD-10-CM | POA: Insufficient documentation

## 2017-07-07 LAB — COMPREHENSIVE METABOLIC PANEL
ALBUMIN: 4.4 g/dL (ref 3.5–5.0)
ALK PHOS: 102 U/L (ref 38–126)
ALT: 19 U/L (ref 14–54)
ANION GAP: 12 (ref 5–15)
AST: 22 U/L (ref 15–41)
BILIRUBIN TOTAL: 1 mg/dL (ref 0.3–1.2)
BUN: 13 mg/dL (ref 6–20)
CALCIUM: 9.7 mg/dL (ref 8.9–10.3)
CO2: 28 mmol/L (ref 22–32)
Chloride: 97 mmol/L — ABNORMAL LOW (ref 101–111)
Creatinine, Ser: 0.73 mg/dL (ref 0.44–1.00)
GLUCOSE: 128 mg/dL — AB (ref 65–99)
POTASSIUM: 2.9 mmol/L — AB (ref 3.5–5.1)
SODIUM: 137 mmol/L (ref 135–145)
TOTAL PROTEIN: 7.5 g/dL (ref 6.5–8.1)

## 2017-07-07 LAB — CBC
HCT: 40.5 % (ref 36.0–46.0)
HEMOGLOBIN: 14 g/dL (ref 12.0–15.0)
MCH: 31.4 pg (ref 26.0–34.0)
MCHC: 34.6 g/dL (ref 30.0–36.0)
MCV: 90.8 fL (ref 78.0–100.0)
Platelets: 193 10*3/uL (ref 150–400)
RBC: 4.46 MIL/uL (ref 3.87–5.11)
RDW: 13.5 % (ref 11.5–15.5)
WBC: 14.6 10*3/uL — AB (ref 4.0–10.5)

## 2017-07-07 LAB — I-STAT TROPONIN, ED: Troponin i, poc: 0 ng/mL (ref 0.00–0.08)

## 2017-07-07 LAB — LIPASE, BLOOD: LIPASE: 30 U/L (ref 11–51)

## 2017-07-07 MED ORDER — KETOROLAC TROMETHAMINE 30 MG/ML IJ SOLN
30.0000 mg | Freq: Once | INTRAMUSCULAR | Status: AC
  Administered 2017-07-07: 30 mg via INTRAVENOUS
  Filled 2017-07-07: qty 1

## 2017-07-07 MED ORDER — POTASSIUM CHLORIDE CRYS ER 20 MEQ PO TBCR
40.0000 meq | EXTENDED_RELEASE_TABLET | Freq: Once | ORAL | Status: DC
  Filled 2017-07-07: qty 2

## 2017-07-07 MED ORDER — METOCLOPRAMIDE HCL 5 MG/ML IJ SOLN
10.0000 mg | Freq: Once | INTRAMUSCULAR | Status: AC
  Administered 2017-07-07: 10 mg via INTRAVENOUS
  Filled 2017-07-07: qty 2

## 2017-07-07 MED ORDER — POTASSIUM CHLORIDE 10 MEQ/100ML IV SOLN
10.0000 meq | Freq: Once | INTRAVENOUS | Status: AC
  Administered 2017-07-07: 10 meq via INTRAVENOUS
  Filled 2017-07-07: qty 100

## 2017-07-07 MED ORDER — ONDANSETRON 4 MG PO TBDP
4.0000 mg | ORAL_TABLET | Freq: Once | ORAL | Status: AC | PRN
  Administered 2017-07-07: 4 mg via ORAL
  Filled 2017-07-07 (×3): qty 1

## 2017-07-07 MED ORDER — PREDNISONE 20 MG PO TABS
60.0000 mg | ORAL_TABLET | Freq: Once | ORAL | Status: AC
  Administered 2017-07-07: 60 mg via ORAL
  Filled 2017-07-07: qty 3

## 2017-07-07 MED ORDER — ALBUTEROL SULFATE HFA 108 (90 BASE) MCG/ACT IN AERS: 1.0000 | INHALATION_SPRAY | Freq: Four times a day (QID) | RESPIRATORY_TRACT | 0 refills | Status: DC | PRN

## 2017-07-07 MED ORDER — ONDANSETRON 4 MG PO TBDP: 4.0000 mg | ORAL_TABLET | Freq: Three times a day (TID) | ORAL | 0 refills | Status: DC | PRN

## 2017-07-07 MED ORDER — POTASSIUM CHLORIDE ER 10 MEQ PO TBCR: 10.0000 meq | EXTENDED_RELEASE_TABLET | Freq: Every day | ORAL | 0 refills | Status: DC

## 2017-07-07 MED ORDER — SODIUM CHLORIDE 0.9 % IV BOLUS (SEPSIS)
2000.0000 mL | Freq: Once | INTRAVENOUS | Status: AC
  Administered 2017-07-07: 2000 mL via INTRAVENOUS

## 2017-07-07 MED ORDER — IPRATROPIUM-ALBUTEROL 0.5-2.5 (3) MG/3ML IN SOLN
3.0000 mL | Freq: Once | RESPIRATORY_TRACT | Status: AC
  Administered 2017-07-07: 3 mL via RESPIRATORY_TRACT
  Filled 2017-07-07: qty 3

## 2017-07-07 MED ORDER — PREDNISONE 20 MG PO TABS: 60.0000 mg | ORAL_TABLET | Freq: Every day | ORAL | 0 refills | Status: AC

## 2017-07-07 NOTE — ED Provider Notes (Signed)
[facility] DEPT Provider Note   CSN: [id number] Arrival date & time: 07/07/17  1233     History   Chief Complaint Chief Complaint  Patient presents with  . Nausea  . Emesis  . Anxiety    HPI Adrienne Garcia is a 51 y.o. female.  HPI  51 y.o. female with a hx of Asthma, presents to the Emergency Department today due to N/V/D since Monday. Pt states hx same. Notes no sick contacts. Notes N/V/D somewhat improving, but states she still has emesis and feels weak. Unable to tolerate PO. Notes abdominal pain in epigastric region. Rates 6/10. Intermittent. Able to tolerate PO. No fevers. No cough/congestion. No CP/SOB. Pt notified EMS and was brought to ED. Pt was in ED yesterday but LWBS due to wait time. No other symptoms noted.   Past Medical History:  Diagnosis Date  . Anemia   . Anxiety   . Asthma   . Basal cell carcinoma of neck    "front of my neck; it was a melanoma"  . Bipolar disorder ([place])   . Daily headache   . Depression   . Fibromyalgia   . Intractable nausea and vomiting   . Migraine    "probably twice/month" (09/19/2015)  . Obesity   . PTSD (post-traumatic stress disorder)   . Thyroid disease     Patient Active Problem List   Diagnosis Date Noted  . Influenza B 01/29/2016  . Influenza 01/29/2016  . Status asthmaticus   . Bipolar affective disorder, currently manic, moderate ([place])   . Acute bronchitis with asthma with acute exacerbation 01/26/2016  . Acute bronchitis with bronchospasm 01/26/2016  . Bronchitis with bronchospasm   . Costochondritis, acute   . Intractable nausea and vomiting 09/19/2015  . Hyperglycemia 09/19/2015  . Leukocytosis 09/19/2015  . Thrombocytopenia ([place]) 09/19/2015  . Diarrhea 09/19/2015  . Obesity 09/19/2015  . Thyroid disease   . Depression     Past Surgical History:  Procedure Laterality Date  . MOLE REMOVAL  ~ 2013   "front side of my neck"  . MULTIPLE TOOTH EXTRACTIONS  ~ 2011    OB History    No data available        Home Medications    Prior to Admission medications   Medication Sig Start Date End Date Taking? Authorizing Provider  albuterol (PROVENTIL HFA;VENTOLIN HFA) 108 (90 Base) MCG/ACT inhaler Inhale 2 puffs into the lungs every 6 (six) hours as needed for wheezing or shortness of breath. 05/04/17   Arnoldo Morale, MD  amoxicillin (AMOXIL) 500 MG capsule Take 2 capsules (1,000 mg total) by mouth 2 (two) times daily. 04/30/17   Pollina, Gwenyth Allegra, MD  HYDROcodone-homatropine (HYCODAN) 5-1.5 MG/5ML syrup Take 5 mLs by mouth every 6 (six) hours as needed for cough. 04/30/17   Orpah Greek, MD  predniSONE (DELTASONE) 20 MG tablet Take 3 tablets (60 mg total) by mouth daily with breakfast. 04/30/17   Pollina, Gwenyth Allegra, MD    Family History Family History  Problem Relation Age of Onset  . CAD Mother   . Hypertension Unknown        sibling  . Diabetes Unknown        sibling    Social History Social History  Substance Use Topics  . Smoking status: Former Smoker    Packs/day: 0.50    Years: 28.00    Types: Cigarettes  . Smokeless tobacco: Never Used     Comment: "quit smoking ~ 2014"  . Alcohol  use No     Allergies   Ciprofloxacin   Review of Systems Review of Systems ROS reviewed and all are negative for acute change except as noted in the HPI.  Physical Exam Updated Vital Signs BP 127/83 (BP Location: Right Arm)   Pulse 70   Resp 18   LMP 12/16/2011   SpO2 100%   Physical Exam  Constitutional: She is oriented to person, place, and time. Vital signs are normal. She appears well-developed and well-nourished. No distress.  HENT:  Head: Normocephalic and atraumatic.  Right Ear: Hearing, tympanic membrane, external ear and ear canal normal.  Left Ear: Hearing, tympanic membrane, external ear and ear canal normal.  Nose: Nose normal.  Mouth/Throat: Uvula is midline, oropharynx is clear and moist and mucous membranes are normal. No trismus in the jaw. No  oropharyngeal exudate, posterior oropharyngeal erythema or tonsillar abscesses.  Eyes: Pupils are equal, round, and reactive to light. Conjunctivae and EOM are normal.  Neck: Normal range of motion. Neck supple. No tracheal deviation present.  Cardiovascular: Normal rate, regular rhythm, S1 normal, S2 normal, normal heart sounds, intact distal pulses and normal pulses.   Pulmonary/Chest: Effort normal. No respiratory distress. She has no decreased breath sounds. She has wheezes in the right upper field, the right lower field, the left upper field and the left lower field. She has no rhonchi. She has no rales.  Abdominal: Normal appearance and bowel sounds are normal. There is no tenderness. There is no rigidity, no rebound, no guarding, no CVA tenderness, no tenderness at McBurney's point and negative Murphy's sign.  Musculoskeletal: Normal range of motion.  Neurological: She is alert and oriented to person, place, and time.  Skin: Skin is warm and dry.  Psychiatric: She has a normal mood and affect. Her speech is normal and behavior is normal. Thought content normal.   ED Treatments / Results  Labs (all labs ordered are listed, but only abnormal results are displayed) Labs Reviewed  COMPREHENSIVE METABOLIC PANEL - Abnormal; Notable for the following:       Result Value   Potassium 2.9 (*)    Chloride 97 (*)    Glucose, Bld 128 (*)    All other components within normal limits  CBC - Abnormal; Notable for the following:    WBC 14.6 (*)    All other components within normal limits  LIPASE, BLOOD  URINALYSIS, ROUTINE W REFLEX MICROSCOPIC  I-STAT TROPONIN, ED    EKG  EKG Interpretation None       Radiology Dg Chest 2 View  Result Date: 07/07/2017 CLINICAL DATA:  Cough for 2 day EXAM: CHEST  2 VIEW COMPARISON:  07/05/2017 FINDINGS: Normal heart size. Lungs clear. No pneumothorax. No pleural effusion. IMPRESSION: No active cardiopulmonary disease. Electronically Signed   By: Marybelle Killings M.D.   On: 07/07/2017 20:28    Procedures Procedures (including critical care time)  Medications Ordered in ED Medications  ondansetron (ZOFRAN-ODT) disintegrating tablet 4 mg (not administered)  potassium chloride SA (K-DUR,KLOR-CON) CR tablet 40 mEq (0 mEq Oral Hold 07/07/17 1942)  sodium chloride 0.9 % bolus 2,000 mL (2,000 mLs Intravenous New Bag/Given 07/07/17 1941)  metoCLOPramide (REGLAN) injection 10 mg (10 mg Intravenous Given 07/07/17 1941)  potassium chloride 10 mEq in 100 mL IVPB (10 mEq Intravenous New Bag/Given 07/07/17 1947)  ketorolac (TORADOL) 30 MG/ML injection 30 mg (30 mg Intravenous Given 07/07/17 1941)  ipratropium-albuterol (DUONEB) 0.5-2.5 (3) MG/3ML nebulizer solution 3 mL (3 mLs Nebulization Given 07/07/17  2033)     Initial Impression / Assessment and Plan / ED Course  I have reviewed the triage vital signs and the nursing notes.  Pertinent labs & imaging results that were available during my care of the patient were reviewed by me and considered in my medical decision making (see chart for details).  Final Clinical Impressions(s) / ED Diagnoses  {I have reviewed and evaluated the relevant laboratory values. {I have reviewed and evaluated the relevant imaging studies.  {I have reviewed the relevant previous healthcare records.  {I obtained HPI from historian.   ED Course:  Assessment: Pt is a 51 y.o. female  with a hx of Asthma, presents to the Emergency Department today due to N/V/D since Monday. Pt states hx same. Notes no sick contacts. Notes N/V/D somewhat improving, but states she still has emesis and feels weak. Unable to tolerate PO. Notes abdominal pain in epigastric region. Rates 6/10. Intermittent. Able to tolerate PO. No fevers. No cough/congestion. No CP/SOB. Pt notified EMS and was brought to ED. Pt was in ED yesterday but LWBS due to wait time. On exam, pt in NAD. Nontoxic/nonseptic appearing. VSS. Lungs CTA. Heart RRR. Abdomen nontender soft. Labs with  hypokalemia 2.9. Otherwise unremarkable. Given Ns bolus (2L) as well as potassium IV/PO in ED. Given Reglan. No emesis in ED. Able to tolerate PO. Patient does not meet the SIRS or Sepsis criteria.  On repeat exam patient does not have a surgical abdomen and there are no peritoneal signs.  No indication of appendicitis, bowel obstruction, bowel perforation, cholecystitis, diverticulitis, PID or ectopic pregnancy. Likely viral etiology. Will also treat patient for COPD exacerbation. Trop and CXR unremarkable. EKG unremarkable. Given prednisone and albuterol with improvement. Plan is to DC home with follow up to PCP. At time of discharge, Patient is in no acute distress. Vital Signs are stable. Patient is able to ambulate. Patient able to tolerate PO.   Disposition/Plan:  DC Home Additional Verbal discharge instructions given and discussed with patient.  Pt Instructed to f/u with PCP in the next week for evaluation and treatment of symptoms. Return precautions given Pt acknowledges and agrees with plan  Supervising Physician Quintella Reichert, MD  Final diagnoses:  Nausea vomiting and diarrhea  Gastroenteritis  Hypokalemia  COPD exacerbation [facility])    New Prescriptions New Prescriptions   No medications on file     Conni Slipper 07/07/17 2323    Quintella Reichert, MD 07/09/17 [phone]

## 2017-07-07 NOTE — ED Triage Notes (Signed)
Per EMS, pt here from home.  Pt c/o n/v since Monday.  Pt here yesterday but LWBS.  Vitals:  120/78, hr 70, cbg 128, 100% ra

## 2017-07-07 NOTE — ED Notes (Signed)
Gave pt water for fluid challenge 

## 2017-07-07 NOTE — Discharge Instructions (Addendum)
Please read and follow all provided instructions.  Your diagnoses today include:  1. Nausea vomiting and diarrhea   2. Gastroenteritis   3. Hypokalemia   4. COPD exacerbation ([place])     Tests performed today include: Vital signs. See below for your results today.   Medications prescribed:  Take as prescribed   Home care instructions:  Follow any educational materials contained in this packet.  Follow-up instructions: Please follow-up with your primary care provider for further evaluation of symptoms and treatment   Return instructions:  Please return to the Emergency Department if you do not get better, if you get worse, or new symptoms OR  - Fever (temperature greater than 101.84F)  - Bleeding that does not stop with holding pressure to the area    -Severe pain (please note that you may be more sore the day after your accident)  - Chest Pain  - Difficulty breathing  - Severe nausea or vomiting  - Inability to tolerate food and liquids  - Passing out  - Skin becoming red around your wounds  - Change in mental status (confusion or lethargy)  - New numbness or weakness    Please return if you have any other emergent concerns.  Additional Information:  Your vital signs today were: BP (!) 148/84 (BP Location: Left Arm)    Pulse 60    Resp 18    LMP 12/16/2011    SpO2 99%  If your blood pressure (BP) was elevated above 135/85 this visit, please have this repeated by your doctor within one month. ---------------

## 2017-08-06 MED FILL — !VENTOLIN HFA INHALER: 108 (90 BAS | 25 days supply | Qty: 18 | Fill #2

## 2017-09-24 MED FILL — !VENTOLIN HFA INHALER: 108 (90 BAS | 30 days supply | Qty: 18 | Fill #0

## 2018-01-28 MED FILL — !VENTOLIN HFA INHALER: 108 (90 BAS | 25 days supply | Qty: 18 | Fill #1

## 2020-03-27 ENCOUNTER — Other Ambulatory Visit: Payer: Self-pay

## 2020-03-27 ENCOUNTER — Ambulatory Visit (HOSPITAL_COMMUNITY)
Admission: EM | Admit: 2020-03-27 | Discharge: 2020-03-27 | Disposition: A | Discharge status: Home / Self Care | Payer: Self-pay | Attending: Urgent Care | Admitting: Urgent Care

## 2020-03-27 ENCOUNTER — Encounter (HOSPITAL_COMMUNITY): Payer: Self-pay

## 2020-03-27 DIAGNOSIS — E860 Dehydration: Secondary | ICD-10-CM

## 2020-03-27 DIAGNOSIS — R197 Diarrhea, unspecified: Secondary | ICD-10-CM

## 2020-03-27 DIAGNOSIS — R112 Nausea with vomiting, unspecified: Secondary | ICD-10-CM

## 2020-03-27 DIAGNOSIS — R1084 Generalized abdominal pain: Secondary | ICD-10-CM

## 2020-03-27 DIAGNOSIS — A084 Viral intestinal infection, unspecified: Secondary | ICD-10-CM

## 2020-03-27 LAB — POCT URINALYSIS DIP (DEVICE)
Glucose, UA: NEGATIVE mg/dL
Leukocytes,Ua: NEGATIVE
Nitrite: NEGATIVE
Protein, ur: >=300 mg/dL — AB
Specific Gravity, Urine: >=1.03 (ref 1.005–1.030)
Urobilinogen, UA: 2 mg/dL — ABNORMAL HIGH (ref 0.0–1.0)
pH: 6 (ref 5.0–8.0)

## 2020-03-27 MED ORDER — SODIUM CHLORIDE 0.9 % IV BOLUS
1000.0000 mL | Freq: Once | INTRAVENOUS | Status: AC
  Administered 2020-03-27: 16:00:00 1000 mL via INTRAVENOUS

## 2020-03-27 MED ORDER — LOPERAMIDE HCL 2 MG PO CAPS: 2.0000 mg | ORAL_CAPSULE | Freq: Two times a day (BID) | ORAL | 0 refills | Status: DC | PRN

## 2020-03-27 MED ORDER — ONDANSETRON HCL 4 MG/2ML IJ SOLN
INTRAMUSCULAR | Status: AC
  Filled 2020-03-27: qty 2

## 2020-03-27 MED ORDER — ONDANSETRON 8 MG PO TBDP: 8.0000 mg | ORAL_TABLET | Freq: Three times a day (TID) | ORAL | 0 refills | Status: DC | PRN

## 2020-03-27 MED ORDER — ONDANSETRON HCL 4 MG/2ML IJ SOLN
4.0000 mg | Freq: Once | INTRAMUSCULAR | Status: AC
  Administered 2020-03-27: 16:00:00 4 mg via INTRAVENOUS

## 2020-03-27 NOTE — Discharge Instructions (Signed)

## 2020-03-27 NOTE — ED Triage Notes (Signed)
Pt c/o N/V/Dx3 days. Pt c/o dizziness. Pt state family members at h ome have had similar symptoms.

## 2020-03-27 NOTE — ED Provider Notes (Signed)
[place]   MRN: [id number] DOB: 10-Jan-1966  Subjective:   Adrienne Garcia is a 54 y.o. female presenting for 3-day history of persistent and worsening nausea with vomiting and diarrhea.  Patient states that everyone at home has gastroenteritis, stomach infection with similar symptoms.  However, she has not been able to tolerate anything p.o. including fluids and liquids.  She has been taking Zofran and Phenergan as given to her from her family with minimal relief.  Denies fever, runny or stuffy nose, cough, sore throat, chest pain, shortness of breath, hematemesis, bloody stools.  No COVID-19 contacts.  She does not want to get Covid tested.  No current facility-administered medications for this encounter.  Current Outpatient Medications:  .  albuterol (PROVENTIL HFA;VENTOLIN HFA) 108 (90 Base) MCG/ACT inhaler, Inhale 1-2 puffs into the lungs every 6 (six) hours as needed for wheezing or shortness of breath., Disp: 1 Inhaler, Rfl: 0   Allergies  Allergen Reactions  . Ciprofloxacin Hives    Past Medical History:  Diagnosis Date  . Anemia   . Anxiety   . Asthma   . Basal cell carcinoma of neck    "front of my neck; it was a melanoma"  . Bipolar disorder ([place])   . Daily headache   . Depression   . Fibromyalgia   . Intractable nausea and vomiting   . Migraine    "probably twice/month" (09/19/2015)  . Obesity   . PTSD (post-traumatic stress disorder)   . Thyroid disease      Past Surgical History:  Procedure Laterality Date  . MOLE REMOVAL  ~ 2013   "front side of my neck"  . MULTIPLE TOOTH EXTRACTIONS  ~ 2011    Family History  Problem Relation Age of Onset  . CAD Mother   . Hypertension Other        sibling  . Diabetes Other        sibling    Social History   Tobacco Use  . Smoking status: Former Smoker    Packs/day: 0.50    Years: 28.00    Pack years: 14.00    Types: Cigarettes  . Smokeless tobacco: Never Used  . Tobacco comment: "quit smoking ~  2014"  Substance Use Topics  . Alcohol use: No  . Drug use: Not Currently    Types: Marijuana    Comment: 09/19/2015 "probably twice/yr":    ROS   Objective:   Vitals: BP 127/83   Pulse 66   Temp 97.9 F (36.6 C) (Oral)   Resp 18   Ht 5\' 7"  (1.702 m)   Wt 210 lb (95.3 kg)   LMP 12/16/2011   SpO2 98%   BMI 32.89 kg/m   Physical Exam Constitutional:      General: She is not in acute distress.    Appearance: Normal appearance. She is well-developed and normal weight. She is not ill-appearing, toxic-appearing or diaphoretic.  HENT:     Head: Normocephalic and atraumatic.     Right Ear: External ear normal.     Left Ear: External ear normal.     Nose: Nose normal.     Mouth/Throat:     Mouth: Mucous membranes are moist.     Pharynx: Oropharynx is clear.  Eyes:     General: No scleral icterus.       Right eye: No discharge.        Left eye: No discharge.     Extraocular Movements: Extraocular movements intact.  Pupils: Pupils are equal, round, and reactive to light.  Cardiovascular:     Rate and Rhythm: Normal rate and regular rhythm.     Pulses: Normal pulses.     Heart sounds: Normal heart sounds. No murmur. No friction rub. No gallop.   Pulmonary:     Effort: Pulmonary effort is normal. No respiratory distress.     Breath sounds: Normal breath sounds. No stridor. No wheezing, rhonchi or rales.  Abdominal:     General: Bowel sounds are normal. There is no distension.     Palpations: Abdomen is soft. There is no mass.     Tenderness: There is abdominal tenderness (generalized throughout). There is no right CVA tenderness, left CVA tenderness, guarding or rebound.  Skin:    General: Skin is warm and dry.     Coloration: Skin is not pale.     Findings: No rash.  Neurological:     General: No focal deficit present.     Mental Status: She is alert and oriented to person, place, and time.  Psychiatric:        Mood and Affect: Mood normal.        Behavior:  Behavior normal.        Thought Content: Thought content normal.        Judgment: Judgment normal.     Results for orders placed or performed during the hospital encounter of 03/27/20 (from the past 24 hour(s))  POCT urinalysis dip (device)     Status: Abnormal   Collection Time: 03/27/20  3:24 PM  Result Value Ref Range   Glucose, UA NEGATIVE NEGATIVE mg/dL   Bilirubin Urine SMALL (A) NEGATIVE   Ketones, ur TRACE (A) NEGATIVE mg/dL   Specific Gravity, Urine >=1.030 1.005 - 1.030   Hgb urine dipstick LARGE (A) NEGATIVE   pH 6.0 5.0 - 8.0   Protein, ur >=300 (A) NEGATIVE mg/dL   Urobilinogen, UA 2.0 (H) 0.0 - 1.0 mg/dL   Nitrite NEGATIVE NEGATIVE   Leukocytes,Ua NEGATIVE NEGATIVE   1L IV fluids, 4mg  IV Zofran given.  Assessment and Plan :   PDMP not reviewed this encounter.  1. Nausea vomiting and diarrhea   2. Moderate dehydration   3. Generalized abdominal pain   4. Viral gastroenteritis     Will manage for suspected viral gastroenteritis with supportive care.  Recommended patient hydrate well, eat light meals and maintain electrolytes.  Will use Zofran and Imodium for nausea, vomiting and diarrhea. Counseled patient on potential for adverse effects with medications prescribed/recommended today, ER and return-to-clinic precautions discussed, patient verbalized understanding.    Jaynee Eagles, [place] 03/27/20 1744

## 2020-03-28 MED FILL — ONDANSETRON ODT 8 MG TABLET: 8 | 6 days supply | Qty: 20 | Fill #0

## 2020-04-15 MED FILL — ONDANSETRON ODT 8 MG TABLET: 8 | 6 days supply | Qty: 20 | Fill #0

## 2020-04-28 ENCOUNTER — Encounter (HOSPITAL_COMMUNITY): Payer: Self-pay | Admitting: Emergency Medicine

## 2020-04-28 ENCOUNTER — Emergency Department (HOSPITAL_COMMUNITY)
Admission: EM | Admit: 2020-04-28 | Discharge: 2020-04-29 | Disposition: A | Discharge status: Home / Self Care | Payer: Self-pay | Attending: Emergency Medicine | Admitting: Emergency Medicine

## 2020-04-28 ENCOUNTER — Other Ambulatory Visit: Payer: Self-pay

## 2020-04-28 ENCOUNTER — Emergency Department (HOSPITAL_COMMUNITY): Payer: Self-pay

## 2020-04-28 DIAGNOSIS — Z20822 Contact with and (suspected) exposure to covid-19: Secondary | ICD-10-CM | POA: Insufficient documentation

## 2020-04-28 DIAGNOSIS — Z87891 Personal history of nicotine dependence: Secondary | ICD-10-CM | POA: Insufficient documentation

## 2020-04-28 DIAGNOSIS — R112 Nausea with vomiting, unspecified: Secondary | ICD-10-CM | POA: Insufficient documentation

## 2020-04-28 DIAGNOSIS — K449 Diaphragmatic hernia without obstruction or gangrene: Secondary | ICD-10-CM | POA: Insufficient documentation

## 2020-04-28 DIAGNOSIS — F319 Bipolar disorder, unspecified: Secondary | ICD-10-CM | POA: Insufficient documentation

## 2020-04-28 DIAGNOSIS — R1084 Generalized abdominal pain: Secondary | ICD-10-CM | POA: Insufficient documentation

## 2020-04-28 DIAGNOSIS — Z85828 Personal history of other malignant neoplasm of skin: Secondary | ICD-10-CM | POA: Insufficient documentation

## 2020-04-28 LAB — COMPREHENSIVE METABOLIC PANEL
ALT: 18 U/L (ref 0–44)
AST: 17 U/L (ref 15–41)
Albumin: 4 g/dL (ref 3.5–5.0)
Alkaline Phosphatase: 102 U/L (ref 38–126)
Anion gap: 13 (ref 5–15)
BUN: 8 mg/dL (ref 6–20)
CO2: 22 mmol/L (ref 22–32)
Calcium: 9.4 mg/dL (ref 8.9–10.3)
Chloride: 103 mmol/L (ref 98–111)
Creatinine, Ser: 0.66 mg/dL (ref 0.44–1.00)
GFR calc Af Amer: >60 mL/min (ref >60)
GFR calc non Af Amer: >60 mL/min (ref >60)
Glucose, Bld: 144 mg/dL — ABNORMAL HIGH (ref 70–99)
Potassium: 3.8 mmol/L (ref 3.5–5.1)
Sodium: 138 mmol/L (ref 135–145)
Total Bilirubin: 0.9 mg/dL (ref 0.3–1.2)
Total Protein: 6.6 g/dL (ref 6.5–8.1)

## 2020-04-28 LAB — CBC
HCT: 42 % (ref 36.0–46.0)
Hemoglobin: 14 g/dL (ref 12.0–15.0)
MCH: 31.3 pg (ref 26.0–34.0)
MCHC: 33.3 g/dL (ref 30.0–36.0)
MCV: 93.8 fL (ref 80.0–100.0)
Platelets: 179 10*3/uL (ref 150–400)
RBC: 4.48 MIL/uL (ref 3.87–5.11)
RDW: 12.8 % (ref 11.5–15.5)
WBC: 13 10*3/uL — ABNORMAL HIGH (ref 4.0–10.5)
nRBC: 0 % (ref 0.0–0.2)

## 2020-04-28 LAB — I-STAT BETA HCG BLOOD, ED (MC, WL, AP ONLY): I-stat hCG, quantitative: <5 m[IU]/mL (ref <5)

## 2020-04-28 LAB — LIPASE, BLOOD: Lipase: 18 U/L (ref 11–51)

## 2020-04-28 MED ORDER — HYOSCYAMINE SULFATE 0.125 MG SL SUBL
0.2500 mg | SUBLINGUAL_TABLET | Freq: Once | SUBLINGUAL | Status: AC
  Administered 2020-04-28: 0.25 mg via SUBLINGUAL
  Filled 2020-04-28: qty 2

## 2020-04-28 MED ORDER — SODIUM CHLORIDE 0.9% FLUSH: 3.0000 mL | Freq: Once | INTRAVENOUS | Status: DC

## 2020-04-28 MED ORDER — SODIUM CHLORIDE 0.9 % IV SOLN
1000.0000 mL | INTRAVENOUS | Status: DC
  Administered 2020-04-29: 1000 mL via INTRAVENOUS

## 2020-04-28 MED ORDER — ONDANSETRON 4 MG PO TBDP
4.0000 mg | ORAL_TABLET | Freq: Once | ORAL | Status: AC | PRN
  Administered 2020-04-28: 4 mg via ORAL
  Filled 2020-04-28: qty 1

## 2020-04-28 MED ORDER — SODIUM CHLORIDE 0.9 % IV BOLUS (SEPSIS)
1000.0000 mL | Freq: Once | INTRAVENOUS | Status: AC
  Administered 2020-04-28: 1000 mL via INTRAVENOUS

## 2020-04-28 MED ORDER — ALUM & MAG HYDROXIDE-SIMETH 200-200-20 MG/5ML PO SUSP
30.0000 mL | Freq: Once | ORAL | Status: AC
Start: 2020-04-28 — End: 2020-04-29
  Administered 2020-04-29: 30 mL via ORAL
  Filled 2020-04-28 (×2): qty 30

## 2020-04-28 MED ORDER — METOCLOPRAMIDE HCL 5 MG/ML IJ SOLN
10.0000 mg | Freq: Once | INTRAMUSCULAR | Status: AC
  Administered 2020-04-28: 10 mg via INTRAVENOUS
  Filled 2020-04-28: qty 2

## 2020-04-28 NOTE — ED Triage Notes (Signed)
Pt c/o vomiting since yesterday, not able to eat or drink nothing.

## 2020-04-28 NOTE — ED Provider Notes (Signed)
[name] EMERGENCY DEPARTMENT Provider Note  CSN: [id number] Arrival date & time: 04/28/20 [id number]  Chief Complaint(s) Emesis  HPI Adrienne Garcia is a 54 y.o. female   HPI CC: emesis  Onset/Duration: 2 days Timing: intermittent Quality: NBNB Severity: moderate Modifying Factors:  Improved by: nothing  Worsened by: PO intake Associated Signs/Symptoms:  Pertinent (+): fever on day one, upper abd discomfort (from emesis), cough  Pertinent (-): chest pain, sob, diarrhea, dysuria Context: reports having gastroenteritis several weeks ago and has not felt well since. Reports being seen at [facility] at that time, and getting IVF.   Past Medical History Past Medical History:  Diagnosis Date  . Anemia   . Anxiety   . Asthma   . Basal cell carcinoma of neck    "front of my neck; it was a melanoma"  . Bipolar disorder ([place])   . Daily headache   . Depression   . Fibromyalgia   . Intractable nausea and vomiting   . Migraine    "probably twice/month" (09/19/2015)  . Obesity   . PTSD (post-traumatic stress disorder)   . Thyroid disease    Patient Active Problem List   Diagnosis Date Noted  . Influenza B 01/29/2016  . Influenza 01/29/2016  . Status asthmaticus   . Bipolar affective disorder, currently manic, moderate ([place])   . Acute bronchitis with asthma with acute exacerbation 01/26/2016  . Acute bronchitis with bronchospasm 01/26/2016  . Bronchitis with bronchospasm   . Costochondritis, acute   . Intractable nausea and vomiting 09/19/2015  . Hyperglycemia 09/19/2015  . Leukocytosis 09/19/2015  . Thrombocytopenia ([place]) 09/19/2015  . Diarrhea 09/19/2015  . Obesity 09/19/2015  . Thyroid disease   . Depression    Home Medication(s) Prior to Admission medications   Medication Sig Start Date End Date Taking? Authorizing Provider  albuterol (PROVENTIL HFA;VENTOLIN HFA) 108 (90 Base) MCG/ACT inhaler Inhale 1-2 puffs into the lungs every 6 (six) hours as needed  for wheezing or shortness of breath. Patient not taking: Reported on 04/29/2020 07/07/17   Shary Decamp, PA-C  alum & mag hydroxide-simeth (MAALOX ADVANCED MAX ST) 400-400-40 MG/5ML suspension Take 15 mLs by mouth every 6 (six) hours as needed for indigestion. 04/29/20   Dijon Kohlman, Grayce Sessions, MD  lidocaine (XYLOCAINE) 2 % solution Use as directed 15 mLs in the mouth or throat every 6 (six) hours as needed (abd pain). 04/29/20   Elvan Ebron, Grayce Sessions, MD  loperamide (IMODIUM) 2 MG capsule Take 1 capsule (2 mg total) by mouth 2 (two) times daily as needed for diarrhea or loose stools. Patient not taking: Reported on 04/29/2020 03/27/20   Jaynee Eagles, PA-C  ondansetron (ZOFRAN-ODT) 8 MG disintegrating tablet Take 1 tablet (8 mg total) by mouth every 8 (eight) hours as needed for nausea or vomiting. Patient not taking: Reported on 04/29/2020 03/27/20   Jaynee Eagles, PA-C  promethazine (PHENERGAN) 25 MG suppository Place 1 suppository (25 mg total) rectally every 6 (six) hours as needed for nausea or vomiting. 04/29/20   Rily Nickey, Grayce Sessions, MD  potassium chloride (K-DUR) 10 MEQ tablet Take 1 tablet (10 mEq total) by mouth daily. 07/07/17 03/27/20  Shary Decamp, PA-C  Past Surgical History Past Surgical History:  Procedure Laterality Date  . MOLE REMOVAL  ~ 2013   "front side of my neck"  . MULTIPLE TOOTH EXTRACTIONS  ~ 2011   Family History Family History  Problem Relation Age of Onset  . CAD Mother   . Hypertension Other        sibling  . Diabetes Other        sibling    Social History Social History   Tobacco Use  . Smoking status: Former Smoker    Packs/day: 0.50    Years: 28.00    Pack years: 14.00    Types: Cigarettes  . Smokeless tobacco: Never Used  . Tobacco comment: "quit smoking ~ 2014"  Substance Use Topics  . Alcohol use: No  . Drug use: Not  Currently    Types: Marijuana    Comment: 09/19/2015 "probably twice/yr":   Allergies Ciprofloxacin  Review of Systems Review of Systems All other systems are reviewed and are negative for acute change except as noted in the HPI  Physical Exam Vital Signs  I have reviewed the triage vital signs BP (!) 153/85   Pulse (!) 57   Temp (!) 97.2 F (36.2 C) (Oral)   Resp 20   Ht 5\' 7"  (1.702 m)   Wt 95.3 kg   LMP 12/16/2011   SpO2 97%   BMI 32.89 kg/m   Physical Exam Vitals reviewed.  Constitutional:      General: She is not in acute distress.    Appearance: She is well-developed. She is not diaphoretic.  HENT:     Head: Normocephalic and atraumatic.     Right Ear: External ear normal.     Left Ear: External ear normal.     Nose: Nose normal.  Eyes:     General: No scleral icterus.    Conjunctiva/sclera: Conjunctivae normal.  Neck:     Trachea: Phonation normal.  Cardiovascular:     Rate and Rhythm: Normal rate and regular rhythm.  Pulmonary:     Effort: Pulmonary effort is normal. No respiratory distress.     Breath sounds: No stridor.  Abdominal:     General: There is no distension.     Tenderness: There is generalized abdominal tenderness (mild).  Musculoskeletal:        General: Normal range of motion.     Cervical back: Normal range of motion.  Neurological:     Mental Status: She is alert and oriented to person, place, and time.  Psychiatric:        Behavior: Behavior normal.     ED Results and Treatments Labs (all labs ordered are listed, but only abnormal results are displayed) Labs Reviewed  COMPREHENSIVE METABOLIC PANEL - Abnormal; Notable for the following components:      Result Value   Glucose, Bld 144 (*)    All other components within normal limits  CBC - Abnormal; Notable for the following components:   WBC 13.0 (*)    All other components within normal limits  URINALYSIS, ROUTINE W REFLEX MICROSCOPIC - Abnormal; Notable for the following  components:   Hgb urine dipstick MODERATE (*)    Ketones, ur 5 (*)    Protein, ur >=300 (*)    Bacteria, UA RARE (*)    All other components within normal limits  LIPASE, BLOOD  I-STAT BETA HCG BLOOD, ED ([facility], [facility], [facility] ONLY)  POC SARS CORONAVIRUS 2 AG -  ED  EKG  EKG Interpretation  Date/Time:    Ventricular Rate:    PR Interval:    QRS Duration:   QT Interval:    QTC Calculation:   R Axis:     Text Interpretation:        Radiology DG Chest 1 View  Result Date: 04/29/2020 CLINICAL DATA:  Cough and congestion for several days EXAM: CHEST  1 VIEW COMPARISON:  07/07/2017 FINDINGS: Cardiac shadow is within normal limits. Aortic calcifications are noted. Lungs are well aerated bilaterally. No focal infiltrate or sizable effusion is seen. No bony abnormality is noted. IMPRESSION: No acute abnormality noted. Electronically Signed   By: Inez Catalina M.D.   On: 04/29/2020 00:01   CT ABDOMEN PELVIS W CONTRAST  Result Date: 04/29/2020 CLINICAL DATA:  Nausea vomiting right lower quadrant abdominal pain EXAM: CT ABDOMEN AND PELVIS WITH CONTRAST TECHNIQUE: Multidetector CT imaging of the abdomen and pelvis was performed using the standard protocol following bolus administration of intravenous contrast. CONTRAST:  144mL OMNIPAQUE IOHEXOL 300 MG/ML  SOLN COMPARISON:  December 03, 2010 FINDINGS: Lower chest: The visualized heart size within normal limits. No pericardial fluid/thickening. There is a moderate hiatal hernia present. The visualized portions of the lungs are clear. Hepatobiliary: The liver is normal in density without focal abnormality.The main portal vein is patent. No evidence of calcified gallstones, gallbladder wall thickening or biliary dilatation. Pancreas: Unremarkable. No pancreatic ductal dilatation or surrounding inflammatory changes. Spleen: Normal in size without  focal abnormality. Adrenals/Urinary Tract: Both adrenal glands appear normal. The kidneys and collecting system appear normal without evidence of urinary tract calculus or hydronephrosis. Bladder is unremarkable. Stomach/Bowel: The stomach, small bowel, and colon are normal in appearance. No inflammatory changes, wall thickening, or obstructive findings. Scattered colonic diverticula are noted without diverticulitis.The appendix is normal. Vascular/Lymphatic: There are no enlarged mesenteric, retroperitoneal, or pelvic lymph nodes. There is a mild amount of stenosis seen at the origin of the right renal artery. Reproductive: The uterus and adnexa are unremarkable. Other: No evidence of abdominal wall mass or hernia. Musculoskeletal: No acute or significant osseous findings. IMPRESSION: Moderate hiatal hernia. Diverticulosis without diverticulitis. Aortic Atherosclerosis (ICD10-I70.0). Electronically Signed   By: Prudencio Pair M.D.   On: 04/29/2020 05:46    Pertinent labs & imaging results that were available during my care of the patient were reviewed by me and considered in my medical decision making (see chart for details).  Medications Ordered in ED Medications  sodium chloride flush (NS) 0.9 % injection 3 mL (3 mLs Intravenous Not Given 04/28/20 2327)  sodium chloride 0.9 % bolus 1,000 mL (0 mLs Intravenous Stopped 04/29/20 0105)    Followed by  0.9 %  sodium chloride infusion (0 mLs Intravenous Stopped 04/29/20 0608)  ondansetron (ZOFRAN-ODT) disintegrating tablet 4 mg (4 mg Oral Given 04/28/20 1929)  metoCLOPramide (REGLAN) injection 10 mg (10 mg Intravenous Given 04/28/20 2342)  alum & mag hydroxide-simeth (MAALOX/MYLANTA) 200-200-20 MG/5ML suspension 30 mL (30 mLs Oral Given 04/29/20 0558)  hyoscyamine (LEVSIN SL) SL tablet 0.25 mg (0.25 mg Sublingual Given 04/28/20 2342)  promethazine (PHENERGAN) tablet 12.5 mg (12.5 mg Oral Given 04/29/20 0111)  promethazine (PHENERGAN) injection 12.5 mg (12.5 mg  Intravenous Given 04/29/20 0147)  iohexol (OMNIPAQUE) 300 MG/ML solution 100 mL (100 mLs Intravenous Contrast Given 04/29/20 0529)  lidocaine (XYLOCAINE) 2 % viscous mouth solution 15 mL (15 mLs Mouth/Throat Given 04/29/20 0558)  albuterol (VENTOLIN HFA) 108 (90 Base) MCG/ACT inhaler 2 puff (2 puffs Inhalation Given 04/29/20 0611)  Procedures Procedures  (including critical care time)  Medical Decision Making / ED Course I have reviewed the nursing notes for this encounter and the patient's prior records (if available in EHR or on provided paperwork).   Mishele Weik was evaluated in Emergency Department on 04/29/2020 for the symptoms described in the history of present illness. She was evaluated in the context of the global COVID-19 pandemic, which necessitated consideration that the patient might be at risk for infection with the SARS-CoV-2 virus that causes COVID-19. Institutional protocols and algorithms that pertain to the evaluation of patients at risk for COVID-19 are in a state of rapid change based on information released by regulatory bodies including the CDC and federal and state organizations. These policies and algorithms were followed during the patient's care in the ED.    Clinical Course as of Apr 29 629  Sun Apr 28, 2020  2331 Abd with generalized discomfort to palpation, but not peritonitic.  Labs notable for leukocytosis, otherwise reassuring w/o evidence of biliary obstruction or pancreatitis. Pain is not focal concerning for diverticulitis or appendicitis. Still passing gas and having BM, doubt SOB.  Given development of cough, will get CXR and COVID testing.  Will give IVF, antiemetics, GI cocktail and reassess.    [PC]  Mon Apr 29, 2020  0046 CXR negative. Nausea not improved with Reglan, but kept GI cocktail down. Will given oral  phenergan and fluids.    [PC]  0230 Patient had another episode of emesis despite additional medication.  IV phenergan given. CT ordered.    [PC]  0605 CT notable for moderate hiatal hernia w/o other serious intra-abdominal inflammatory/infectious process or bowel obstruction noted.  Patient given viscous lidocaine.  Apparently she refused GI cocktail initially but took it this time.   [PC]  [id number] Significant relief following GI cocktail.   [PC]    Clinical Course User Index [PC] Isaish Alemu, Grayce Sessions, MD     Final Clinical Impression(s) / ED Diagnoses Final diagnoses:  Nausea and vomiting in adult  Hiatal hernia   The patient appears reasonably screened and/or stabilized for discharge and I doubt any other medical condition or other [facility] requiring further screening, evaluation, or treatment in the ED at this time prior to discharge. Safe for discharge with strict return precautions.  Disposition: Discharge  Condition: Good  I have discussed the results, Dx and Tx plan with the patient/family who expressed understanding and agree(s) with the plan. Discharge instructions discussed at length. The patient/family was given strict return precautions who verbalized understanding of the instructions. No further questions at time of discharge.    ED Discharge Orders         Ordered    alum & mag hydroxide-simeth (MAALOX ADVANCED MAX ST) [id number] MG/5ML suspension  Every 6 hours PRN     04/29/20 0629    lidocaine (XYLOCAINE) 2 % solution  Every 6 hours PRN     04/29/20 0629    promethazine (PHENERGAN) 25 MG suppository  Every 6 hours PRN     04/29/20 [id number]            Follow Up: Primary care provider  Schedule an appointment as soon as possible for a visit  If you do not have a primary care physician, contact [facility] at [phone] for referral      This chart was dictated using voice recognition software.  Despite best efforts to proofread,  errors can occur  which can change the documentation meaning.   Addison Lank  Johnsie Cancel, MD 04/29/20 0630

## 2020-04-29 ENCOUNTER — Encounter (HOSPITAL_COMMUNITY): Payer: Self-pay | Admitting: Radiology

## 2020-04-29 ENCOUNTER — Emergency Department (HOSPITAL_COMMUNITY): Payer: Self-pay

## 2020-04-29 LAB — URINALYSIS, ROUTINE W REFLEX MICROSCOPIC
Bilirubin Urine: NEGATIVE
Glucose, UA: NEGATIVE mg/dL
Ketones, ur: 5 mg/dL — AB
Leukocytes,Ua: NEGATIVE
Nitrite: NEGATIVE
Protein, ur: >=300 mg/dL — AB
Specific Gravity, Urine: 1.024 (ref 1.005–1.030)
pH: 5 (ref 5.0–8.0)

## 2020-04-29 LAB — POC SARS CORONAVIRUS 2 AG -  ED: SARS Coronavirus 2 Ag: NEGATIVE

## 2020-04-29 MED ORDER — PROMETHAZINE HCL 25 MG/ML IJ SOLN
12.5000 mg | Freq: Once | INTRAMUSCULAR | Status: AC
  Administered 2020-04-29: 12.5 mg via INTRAVENOUS
  Filled 2020-04-29: qty 1

## 2020-04-29 MED ORDER — LIDOCAINE VISCOUS HCL 2 % MT SOLN
15.0000 mL | Freq: Four times a day (QID) | OROMUCOSAL | 0 refills | Status: DC | PRN
Start: 2020-04-29 — End: 2020-08-01

## 2020-04-29 MED ORDER — LIDOCAINE VISCOUS HCL 2 % MT SOLN
15.0000 mL | Freq: Once | OROMUCOSAL | Status: AC
  Administered 2020-04-29: 15 mL via OROMUCOSAL
  Filled 2020-04-29: qty 15

## 2020-04-29 MED ORDER — ALBUTEROL SULFATE HFA 108 (90 BASE) MCG/ACT IN AERS
2.0000 | INHALATION_SPRAY | Freq: Once | RESPIRATORY_TRACT | Status: AC
  Administered 2020-04-29: 2 via RESPIRATORY_TRACT
  Filled 2020-04-29: qty 6.7

## 2020-04-29 MED ORDER — PROMETHAZINE HCL 25 MG PO TABS
12.5000 mg | ORAL_TABLET | Freq: Once | ORAL | Status: AC
  Administered 2020-04-29: 12.5 mg via ORAL
  Filled 2020-04-29: qty 1

## 2020-04-29 MED ORDER — ALUM & MAG HYDROXIDE-SIMETH 400-400-40 MG/5ML PO SUSP: 15.0000 mL | Freq: Four times a day (QID) | ORAL | 0 refills | Status: DC | PRN

## 2020-04-29 MED ORDER — PROMETHAZINE HCL 25 MG RE SUPP
25.0000 mg | Freq: Four times a day (QID) | RECTAL | 0 refills | Status: DC | PRN
Start: 2020-04-29 — End: 2020-08-01

## 2020-04-29 MED ORDER — IOHEXOL 300 MG/ML  SOLN
100.0000 mL | Freq: Once | INTRAMUSCULAR | Status: AC | PRN
  Administered 2020-04-29: 100 mL via INTRAVENOUS

## 2020-04-29 NOTE — ED Notes (Signed)
Pt had one episode of vomiting. EDP aware.

## 2020-04-29 NOTE — ED Notes (Signed)
Pt to ct 

## 2020-04-30 MED FILL — LIDOCAINE 2% VISCOUS SOLN: 2 | 2 days supply | Qty: 100 | Fill #0

## 2020-04-30 MED FILL — PROMETHAZINE HCL 25 MG SUPP: 25 | 3 days supply | Qty: 12 | Fill #0

## 2020-06-30 DIAGNOSIS — Z419 Encounter for procedure for purposes other than remedying health state, unspecified: Secondary | ICD-10-CM | POA: Diagnosis not present

## 2020-07-31 DIAGNOSIS — Z419 Encounter for procedure for purposes other than remedying health state, unspecified: Secondary | ICD-10-CM | POA: Diagnosis not present

## 2020-08-01 ENCOUNTER — Encounter (HOSPITAL_COMMUNITY): Payer: Self-pay | Admitting: Emergency Medicine

## 2020-08-01 ENCOUNTER — Ambulatory Visit (HOSPITAL_COMMUNITY)
Admission: EM | Admit: 2020-08-01 | Discharge: 2020-08-01 | Disposition: A | Discharge status: Home / Self Care | Payer: Medicaid Other | Attending: Family Medicine | Admitting: Family Medicine

## 2020-08-01 ENCOUNTER — Ambulatory Visit (INDEPENDENT_AMBULATORY_CARE_PROVIDER_SITE_OTHER): Payer: Medicaid Other

## 2020-08-01 ENCOUNTER — Other Ambulatory Visit: Payer: Self-pay

## 2020-08-01 DIAGNOSIS — R112 Nausea with vomiting, unspecified: Secondary | ICD-10-CM

## 2020-08-01 DIAGNOSIS — K5900 Constipation, unspecified: Secondary | ICD-10-CM

## 2020-08-01 DIAGNOSIS — K449 Diaphragmatic hernia without obstruction or gangrene: Secondary | ICD-10-CM | POA: Diagnosis not present

## 2020-08-01 DIAGNOSIS — R1110 Vomiting, unspecified: Secondary | ICD-10-CM

## 2020-08-01 DIAGNOSIS — R109 Unspecified abdominal pain: Secondary | ICD-10-CM

## 2020-08-01 DIAGNOSIS — N814 Uterovaginal prolapse, unspecified: Secondary | ICD-10-CM

## 2020-08-01 DIAGNOSIS — R0602 Shortness of breath: Secondary | ICD-10-CM | POA: Diagnosis not present

## 2020-08-01 LAB — POCT URINALYSIS DIPSTICK, ED / UC
Bilirubin Urine: NEGATIVE
Glucose, UA: NEGATIVE mg/dL
Ketones, ur: NEGATIVE mg/dL
Leukocytes,Ua: NEGATIVE
Nitrite: NEGATIVE
Protein, ur: 100 mg/dL — AB
Specific Gravity, Urine: >=1.03 (ref 1.005–1.030)
Urobilinogen, UA: 0.2 mg/dL (ref 0.0–1.0)
pH: 5.5 (ref 5.0–8.0)

## 2020-08-01 MED ORDER — POLYETHYLENE GLYCOL 3350 17 GM/SCOOP PO POWD: 1.0000 | Freq: Once | ORAL | 0 refills | Status: AC

## 2020-08-01 MED ORDER — PROMETHAZINE HCL 25 MG PO TABS
25.0000 mg | ORAL_TABLET | Freq: Four times a day (QID) | ORAL | 0 refills | Status: DC | PRN
Start: 2020-08-01 — End: 2020-12-20

## 2020-08-01 MED ORDER — KETOROLAC TROMETHAMINE 30 MG/ML IJ SOLN
30.0000 mg | Freq: Once | INTRAMUSCULAR | Status: AC
  Administered 2020-08-01: 30 mg via INTRAMUSCULAR

## 2020-08-01 MED ORDER — ALUM & MAG HYDROXIDE-SIMETH 400-400-40 MG/5ML PO SUSP: 15.0000 mL | Freq: Four times a day (QID) | ORAL | 0 refills | Status: DC | PRN

## 2020-08-01 MED ORDER — KETOROLAC TROMETHAMINE 30 MG/ML IJ SOLN
INTRAMUSCULAR | Status: AC
  Filled 2020-08-01: qty 1

## 2020-08-01 MED ORDER — LIDOCAINE VISCOUS HCL 2 % MT SOLN
15.0000 mL | Freq: Four times a day (QID) | OROMUCOSAL | 0 refills | Status: DC | PRN
Start: 2020-08-01 — End: 2020-09-24

## 2020-08-01 MED ORDER — OMEPRAZOLE 20 MG PO CPDR
20.0000 mg | DELAYED_RELEASE_CAPSULE | Freq: Two times a day (BID) | ORAL | 1 refills | Status: DC
Start: 2020-08-01 — End: 2020-08-08

## 2020-08-01 MED FILL — PROMETHAZINE 25 MG TABLET: 25 | 7 days supply | Qty: 30 | Fill #0

## 2020-08-01 MED FILL — LIDOCAINE 2% VISCOUS SOLN: 2 | 2 days supply | Qty: 100 | Fill #0

## 2020-08-01 MED FILL — OMEPRAZOLE 20 MG CAP: 20 | 30 days supply | Qty: 60 | Fill #0

## 2020-08-01 NOTE — ED Triage Notes (Signed)
PT has known hiatal hernia. She has been vomiting for a few days and the pain has gotten severe.

## 2020-08-01 NOTE — Discharge Instructions (Signed)
Nothing concerning on x-ray.  Medications as prescribed. Contact given for OB/GYN follow-up, primary care follow-up and gastroenterology.

## 2020-08-01 NOTE — ED Triage Notes (Signed)
Also describes a vaginal prolapse. States that there is a "bubble" protruding from her vagina.

## 2020-08-03 NOTE — ED Provider Notes (Signed)
Adrienne Garcia    CSN: [id number] Arrival date & time: 08/01/20  1221      History   Chief Complaint Chief Complaint  Patient presents with  . Hernia    requests Adrienne Garcia  . Emesis  . vaginal problem    HPI Adrienne Garcia is a 54 y.o. female.   Pt is a 54 year old female that presents with continued abdominal pain, nausea, vomiting. She has known hernia. This has been an ongoing issue for a while. Has had multiple visits for this since May. Has been worked up in the [place].  Reporting some mild constipation.  No fever, chills or diarrhea. Patient also complaining of protrusion from vaginal area.  Has had some mild pressure in vaginal area, burning.  No vaginal bleeding, discharge.  Some dyspareunia.     Past Medical History:  Diagnosis Date  . Anemia   . Anxiety   . Asthma   . Basal cell carcinoma of neck    "front of my neck; it was a melanoma"  . Bipolar disorder ([place])   . Daily headache   . Depression   . Fibromyalgia   . Intractable nausea and vomiting   . Migraine    "probably twice/month" (09/19/2015)  . Obesity   . PTSD (post-traumatic stress disorder)   . Thyroid disease     Patient Active Problem List   Diagnosis Date Noted  . Influenza B 01/29/2016  . Influenza 01/29/2016  . Status asthmaticus   . Bipolar affective disorder, currently manic, moderate ([place])   . Acute bronchitis with asthma with acute exacerbation 01/26/2016  . Acute bronchitis with bronchospasm 01/26/2016  . Bronchitis with bronchospasm   . Costochondritis, acute   . Intractable nausea and vomiting 09/19/2015  . Hyperglycemia 09/19/2015  . Leukocytosis 09/19/2015  . Thrombocytopenia ([place]) 09/19/2015  . Diarrhea 09/19/2015  . Obesity 09/19/2015  . Thyroid disease   . Depression     Past Surgical History:  Procedure Laterality Date  . MOLE REMOVAL  ~ 2013   "front side of my neck"  . MULTIPLE TOOTH EXTRACTIONS  ~ 2011    OB History   No obstetric history on file.       Home Medications    Prior to Admission medications   Medication Sig Start Date End Date Taking? Authorizing Provider  albuterol (PROVENTIL HFA;VENTOLIN HFA) 108 (90 Base) MCG/ACT inhaler Inhale 1-2 puffs into the lungs every 6 (six) hours as needed for wheezing or shortness of breath. Patient not taking: Reported on 04/29/2020 07/07/17   Shary Decamp, PA-C  alum & mag hydroxide-simeth (MAALOX ADVANCED MAX ST) 400-400-40 MG/5ML suspension Take 15 mLs by mouth every 6 (six) hours as needed for indigestion. 08/01/20   Braxtin Bamba, Tressia Miners A, NP  lidocaine (XYLOCAINE) 2 % solution Use as directed 15 mLs in the mouth or throat every 6 (six) hours as needed (abd pain). 08/01/20   Loura Halt A, NP  omeprazole (PRILOSEC) 20 MG capsule Take 1 capsule (20 mg total) by mouth 2 (two) times daily before a meal. 08/01/20   Consuella Scurlock A, NP  promethazine (PHENERGAN) 25 MG tablet Take 1 tablet (25 mg total) by mouth every 6 (six) hours as needed for nausea or vomiting. 08/01/20   Loura Halt A, NP  potassium chloride (K-DUR) 10 MEQ tablet Take 1 tablet (10 mEq total) by mouth daily. 07/07/17 03/27/20  Shary Decamp, PA-C    Family History Family History  Problem Relation Age of Onset  .  CAD Mother   . Hypertension Other        sibling  . Diabetes Other        sibling    Social History Social History   Tobacco Use  . Smoking status: Former Smoker    Packs/day: 0.50    Years: 28.00    Pack years: 14.00    Types: Cigarettes  . Smokeless tobacco: Never Used  . Tobacco comment: "quit smoking ~ 2014"  Substance Use Topics  . Alcohol use: No  . Drug use: Not Currently    Types: Marijuana    Comment: 09/19/2015 "probably twice/yr":     Allergies   Ciprofloxacin   Review of Systems Review of Systems   Physical Exam Triage Vital Signs ED Triage Vitals  Enc Vitals Group     BP 08/01/20 1439 122/79     Pulse Rate 08/01/20 1439 60     Resp 08/01/20 1439 16     Temp 08/01/20 1439 99.1 F (37.3 C)      Temp Source 08/01/20 1439 Oral     SpO2 08/01/20 1439 99 %     Weight --      Height --      Head Circumference --      Peak Flow --      Pain Score 08/01/20 1436 9     Pain Loc --      Pain Edu? --      Excl. in [place]? --    No data found.  Updated Vital Signs BP 122/79   Pulse 60   Temp 99.1 F (37.3 C) (Oral)   Resp 16   LMP 12/16/2011   SpO2 99%   Visual Acuity Right Eye Distance:   Left Eye Distance:   Bilateral Distance:    Right Eye Near:   Left Eye Near:    Bilateral Near:     Physical Exam Nursing note reviewed.  Constitutional:      General: She is not in acute distress.    Appearance: She is obese. She is not ill-appearing, toxic-appearing or diaphoretic.  HENT:     Head: Normocephalic and atraumatic.     Nose: Nose normal.  Eyes:     Conjunctiva/sclera: Conjunctivae normal.  Cardiovascular:     Rate and Rhythm: Normal rate and regular rhythm.  Pulmonary:     Effort: Pulmonary effort is normal.     Breath sounds: Normal breath sounds.  Abdominal:     Palpations: Abdomen is soft.     Tenderness: There is abdominal tenderness.     Comments: Generalized upper abdominal tenderness  Genitourinary:    Comments: Uterine prolapse Musculoskeletal:        General: Normal range of motion.  Skin:    General: Skin is warm and dry.  Psychiatric:        Mood and Affect: Mood normal.      UC Treatments / Results  Labs (all labs ordered are listed, but only abnormal results are displayed) Labs Reviewed  POCT URINALYSIS DIPSTICK, ED / UC - Abnormal; Notable for the following components:      Result Value   Hgb urine dipstick MODERATE (*)    Protein, ur 100 (*)    All other components within normal limits    EKG   Radiology DG Abd Acute W/Chest  Result Date: 08/01/2020 CLINICAL DATA:  PT has known hiatal hernia. She has been vomiting for a few days and the pain has gotten severe. no sob.  EXAM: DG ABDOMEN ACUTE W/ 1V CHEST COMPARISON:  09/19/2015.  FINDINGS: Normal bowel gas pattern. Mild generalized increase in the colonic stool burden. No free air. No evidence of renal or ureteral stones. Soft tissues are unremarkable. Normal heart, mediastinum and hila. Lungs are hyperexpanded, but clear. No acute skeletal abnormality. IMPRESSION: 1. No acute findings.  No evidence of bowel obstruction or free air. 2. Mild generalized increase in the colonic stool burden. 3. No active cardiopulmonary disease. Electronically Signed   By: Lajean Manes M.D.   On: 08/01/2020 15:37    Procedures Procedures (including critical care time)  Medications Ordered in UC Medications  ketorolac (TORADOL) 30 MG/ML injection 30 mg (30 mg Intramuscular Given 08/01/20 1521)    Initial Impression / Assessment and Plan / UC Course  I have reviewed the triage vital signs and the nursing notes.  Pertinent labs & imaging results that were available during my care of the patient were reviewed by me and considered in my medical decision making (see chart for details).     Hiatal hernia Patient recommended see specialist for this. Starting on PPI and Phenergan for nausea as needed. GI cocktail as needed Gave contact for primary care for follow-up and referral to GI versus general surgery.  Constipation Mild stool burden on x-ray.  No bowel obstruction.  Treating with MiraLAX.  Uterine prolapse Contact given for OB/GYN for follow-up for this.     Final Clinical Impressions(s) / UC Diagnoses   Final diagnoses:  Hiatal hernia  Nausea and vomiting, intractability of vomiting not specified, unspecified vomiting type  Uterine prolapse  Constipation, unspecified constipation type     Discharge Instructions     Nothing concerning on x-ray.  Medications as prescribed. Contact given for OB/GYN follow-up, primary care follow-up and gastroenterology.     ED Prescriptions    Medication Sig Dispense Auth. Provider   promethazine (PHENERGAN) 25 MG tablet Take 1  tablet (25 mg total) by mouth every 6 (six) hours as needed for nausea or vomiting. 30 tablet Rasaan Brotherton A, NP   omeprazole (PRILOSEC) 20 MG capsule Take 1 capsule (20 mg total) by mouth 2 (two) times daily before a meal. 60 capsule Mamoru Takeshita A, NP   polyethylene glycol powder (MIRALAX) 17 GM/SCOOP powder Take 255 g by mouth once for 1 dose. 255 g Shawndra Clute A, NP   alum & mag hydroxide-simeth (MAALOX ADVANCED MAX ST) 400-400-40 MG/5ML suspension Take 15 mLs by mouth every 6 (six) hours as needed for indigestion. 355 mL Piera Downs A, NP   lidocaine (XYLOCAINE) 2 % solution Use as directed 15 mLs in the mouth or throat every 6 (six) hours as needed (abd pain). 100 mL Loura Halt A, NP     PDMP not reviewed this encounter.   Orvan July, NP 08/03/20 1032

## 2020-08-08 ENCOUNTER — Other Ambulatory Visit: Payer: Self-pay | Admitting: Family Medicine

## 2020-08-08 ENCOUNTER — Other Ambulatory Visit: Payer: Self-pay

## 2020-08-08 ENCOUNTER — Ambulatory Visit (INDEPENDENT_AMBULATORY_CARE_PROVIDER_SITE_OTHER): Payer: Medicaid Other | Admitting: Family Medicine

## 2020-08-08 ENCOUNTER — Encounter: Payer: Self-pay | Admitting: Family Medicine

## 2020-08-08 VITALS — BP 124/80 | HR 52 | Temp 98.3°F | Ht 68.0 in | Wt 214.0 lb

## 2020-08-08 DIAGNOSIS — K449 Diaphragmatic hernia without obstruction or gangrene: Secondary | ICD-10-CM

## 2020-08-08 DIAGNOSIS — Z13220 Encounter for screening for lipoid disorders: Secondary | ICD-10-CM

## 2020-08-08 DIAGNOSIS — R112 Nausea with vomiting, unspecified: Secondary | ICD-10-CM | POA: Diagnosis not present

## 2020-08-08 DIAGNOSIS — Z1231 Encounter for screening mammogram for malignant neoplasm of breast: Secondary | ICD-10-CM

## 2020-08-08 DIAGNOSIS — Z1211 Encounter for screening for malignant neoplasm of colon: Secondary | ICD-10-CM | POA: Diagnosis not present

## 2020-08-08 DIAGNOSIS — M25561 Pain in right knee: Secondary | ICD-10-CM | POA: Diagnosis not present

## 2020-08-08 DIAGNOSIS — N814 Uterovaginal prolapse, unspecified: Secondary | ICD-10-CM

## 2020-08-08 DIAGNOSIS — M25562 Pain in left knee: Secondary | ICD-10-CM

## 2020-08-08 MED ORDER — PANTOPRAZOLE SODIUM 40 MG PO TBEC: 40.0000 mg | DELAYED_RELEASE_TABLET | Freq: Two times a day (BID) | ORAL | 3 refills | Status: DC

## 2020-08-08 MED FILL — PANTOPRAZOLE SOD DR 40 MG T: 40 | 30 days supply | Qty: 60 | Fill #0
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
## 2020-08-20 ENCOUNTER — Telehealth: Payer: Self-pay | Admitting: Obstetrics & Gynecology

## 2020-08-20 NOTE — Telephone Encounter (Signed)
[facility] referring for Uterine prolapse. Called and left voicemail for patient to call back to be scheduled.

## 2020-08-20 NOTE — Telephone Encounter (Signed)
Patient scheduled 10/6 with [facility].

## 2020-08-27 ENCOUNTER — Encounter: Payer: Self-pay | Admitting: Gastroenterology

## 2020-08-30 DIAGNOSIS — Z419 Encounter for procedure for purposes other than remedying health state, unspecified: Secondary | ICD-10-CM | POA: Diagnosis not present

## 2020-09-04 ENCOUNTER — Other Ambulatory Visit (HOSPITAL_COMMUNITY)
Admission: RE | Admit: 2020-09-04 | Discharge: 2020-09-04 | Disposition: A | Discharge status: Home / Self Care | Payer: Medicaid Other | Source: Ambulatory Visit | Attending: Obstetrics & Gynecology | Admitting: Obstetrics & Gynecology

## 2020-09-04 ENCOUNTER — Encounter: Payer: Self-pay | Admitting: Obstetrics & Gynecology

## 2020-09-04 ENCOUNTER — Ambulatory Visit (INDEPENDENT_AMBULATORY_CARE_PROVIDER_SITE_OTHER): Payer: Medicaid Other | Admitting: Obstetrics & Gynecology

## 2020-09-04 ENCOUNTER — Other Ambulatory Visit: Payer: Self-pay

## 2020-09-04 VITALS — BP 140/80 | Ht 66.0 in | Wt 213.0 lb

## 2020-09-04 DIAGNOSIS — Z124 Encounter for screening for malignant neoplasm of cervix: Secondary | ICD-10-CM | POA: Diagnosis not present

## 2020-09-04 DIAGNOSIS — N9419 Other specified dyspareunia: Secondary | ICD-10-CM

## 2020-09-04 DIAGNOSIS — R339 Retention of urine, unspecified: Secondary | ICD-10-CM | POA: Insufficient documentation

## 2020-09-04 DIAGNOSIS — N814 Uterovaginal prolapse, unspecified: Secondary | ICD-10-CM | POA: Insufficient documentation

## 2020-09-04 NOTE — Patient Instructions (Addendum)
Pelvic Organ Prolapse Pelvic organ prolapse is the stretching, bulging, or dropping of pelvic organs into an abnormal position. It happens when the muscles and tissues that surround and support pelvic structures become weak or stretched. Pelvic organ prolapse can involve the:  Vagina (vaginal prolapse).  Uterus (uterine prolapse).  Bladder (cystocele).  Rectum (rectocele).  Intestines (enterocele). When organs other than the vagina are involved, they often bulge into the vagina or protrude from the vagina, depending on how severe the prolapse is. What are the causes? This condition may be caused by:  Pregnancy, labor, and childbirth.  Past pelvic surgery.  Decreased production of the hormone estrogen associated with menopause.  Consistently lifting more than 50 lb (23 kg).  Obesity.  Long-term inability to pass stool (chronic constipation).  A cough that lasts a long time (chronic).  Buildup of fluid in the abdomen due to certain diseases and other conditions. What are the signs or symptoms? Symptoms of this condition include:  Passing a little urine (loss of bladder control) when you cough, sneeze, strain, and exercise (stress incontinence). This may be worse immediately after childbirth. It may gradually improve over time.  Feeling pressure in your pelvis or vagina. This pressure may increase when you cough or when you are passing stool.  A bulge that protrudes from the opening of your vagina.  Difficulty passing urine or stool.  Pain in your lower back.  Pain, discomfort, or disinterest in sex.  Repeated bladder infections (urinary tract infections).  Difficulty inserting a tampon. In some people, this condition causes no symptoms. How is this diagnosed? This condition may be diagnosed based on a vaginal and rectal exam. During the exam, you may be asked to cough and strain while you are lying down, sitting, and standing up. Your health care provider will  determine if other tests are required, such as bladder function tests. How is this treated? Treatment for this condition may depend on your symptoms. Treatment may include:  Lifestyle changes, such as changes to your diet.  Emptying your bladder at scheduled times (bladder training therapy). This can help reduce or avoid urinary incontinence.  Estrogen. Estrogen may help mild prolapse by increasing the strength and tone of pelvic floor muscles.  Kegel exercises. These may help mild cases of prolapse by strengthening and tightening the muscles of the pelvic floor.  A soft, flexible device that helps support the vaginal walls and keep pelvic organs in place (pessary). This is inserted into your vagina by your health care provider.  Surgery. This is often the only form of treatment for severe prolapse. Follow these instructions at home:  Avoid drinking beverages that contain caffeine or alcohol.  Increase your intake of high-fiber foods. This can help decrease constipation and straining during bowel movements.  Lose weight if recommended by your health care provider.  Wear a sanitary pad or adult diapers if you have urinary incontinence.  Avoid heavy lifting and straining with exercise and work. Do not hold your breath when you perform mild to moderate lifting and exercise activities. Limit your activities as directed by your health care provider.  Do Kegel exercises as directed by your health care provider. To do this: ? Squeeze your pelvic floor muscles tight. You should feel a tight lift in your rectal area and a tightness in your vaginal area. Keep your stomach, buttocks, and legs relaxed. ? Hold the muscles tight for up to 10 seconds. ? Relax your muscles. ? Repeat this exercise 50 times a day,  or as many times as told by your health care provider. Continue to do this exercise for at least 4-6 weeks, or for as long as told by your health care provider.  Take over-the-counter and  prescription medicines only as told by your health care provider.  If you have a pessary, take care of it as told by your health care provider.  Keep all follow-up visits as told by your health care provider. This is important. Contact a health care provider if you:  Have symptoms that interfere with your daily activities or sex life.  Need medicine to help with the discomfort.  Notice bleeding from your vagina that is not related to your period.  Have a fever.  Have pain or bleeding when you urinate.  Have bleeding when you pass stool.  Pass urine when you have sex.  Have chronic constipation.  Have a pessary that falls out.  Have bad smelling vaginal discharge.  Have an unusual, low pain in your abdomen. Summary  Pelvic organ prolapse is the stretching, bulging, or dropping of pelvic organs into an abnormal position. It happens when the muscles and tissues that surround and support pelvic structures become weak or stretched.  When organs other than the vagina are involved, they often bulge into the vagina or protrude from the vagina, depending on how severe the prolapse is.  In most cases, this condition needs to be treated only if it produces symptoms. Treatment may include lifestyle changes, estrogen, Kegel exercises, pessary insertion, or surgery.  Avoid heavy lifting and straining with exercise and work. Do not hold your breath when you perform mild to moderate lifting and exercise activities. Limit your activities as directed by your health care provider. This information is not intended to replace advice given to you by your health care provider. Make sure you discuss any questions you have with your health care provider. Document Revised: 12/08/2017 Document Reviewed: 12/08/2017 Elsevier Patient Education  2020 [place]   Vaginal Hysterectomy  A vaginal hysterectomy is a procedure to remove all or part of the uterus through a small incision in the vagina. In  this procedure, your health care provider may remove your entire uterus, including the lower end (cervix). You may need a vaginal hysterectomy to treat:  Uterine fibroids.  A condition that causes the lining of the uterus to grow in other areas (endometriosis).  Problems with pelvic support.  Cancer of the cervix, ovaries, uterus, or tissue that lines the uterus (endometrium).  Excessive (dysfunctional) uterine bleeding. When removing your uterus, your health care provider may also remove the organs that produce eggs (ovaries) and the tubes that carry eggs to your uterus (fallopian tubes). After a vaginal hysterectomy, you will no longer be able to have a baby. You will also no longer get your menstrual period. Tell a health care provider about:  Any allergies you have.  All medicines you are taking, including vitamins, herbs, eye drops, creams, and over-the-counter medicines.  Any problems you or family members have had with anesthetic medicines.  Any blood disorders you have.  Any surgeries you have had.  Any medical conditions you have.  Whether you are pregnant or may be pregnant. What are the risks? Generally, this is a safe procedure. However, problems may occur, including:  Bleeding.  Infection.  A blood clot that forms in your leg and travels to your lungs (pulmonary embolism).  Damage to surrounding organs.  Pain during sex. What happens before the procedure?  Ask your  health care provider what organs will be removed during surgery.  Ask your health care provider about: ? Changing or stopping your regular medicines. This is especially important if you are taking diabetes medicines or blood thinners. ? Taking medicines such as aspirin and ibuprofen. These medicines can thin your blood. Do not take these medicines before your procedure if your health care provider instructs you not to.  Follow instructions from your health care provider about eating or drinking  restrictions.  Do not use any tobacco products, such as cigarettes, chewing tobacco, and e-cigarettes. If you need help quitting, ask your health care provider.  Plan to have someone take you home after discharge from the hospital. What happens during the procedure?  To reduce your risk of infection: ? Your health care team will wash or sanitize their hands. ? Your skin will be washed with soap.  An IV tube will be inserted into one of your veins.  You may be given antibiotic medicine to help prevent infection.  You will be given one or more of the following: ? A medicine to help you relax (sedative). ? A medicine to numb the area (local anesthetic). ? A medicine to make you fall asleep (general anesthetic). ? A medicine that is injected into an area of your body to numb everything beyond the injection site (regional anesthetic).  Your surgeon will make an incision in your vagina.  Your surgeon will locate and remove all or part of your uterus.  Your ovaries and fallopian tubes may be removed at the same time.  The incision will be closed with stitches (sutures) that dissolve over time. The procedure may vary among health care providers and hospitals. What happens after the procedure?  Your blood pressure, heart rate, breathing rate, and blood oxygen level will be monitored often until the medicines you were given have worn off.  You will be encouraged to get up and walk around after a few hours to help prevent complications.  You may have IV tubes in place for a few days.  You will be given pain medicine as needed.  Do not drive for 24 hours if you were given a sedative. This information is not intended to replace advice given to you by your health care provider. Make sure you discuss any questions you have with your health care provider. Document Revised: 07/09/2016 Document Reviewed: 12/01/2015 Elsevier Patient Education  2020 [employer].

## 2020-09-04 NOTE — Progress Notes (Signed)
Patient is a 53 yo G2P2 WF who complains of symptoms related to prolapse and cystocele. Problem started several years ago although worse this year.   Symptoms include: prolapse of tissue with straining and discomfort: moderate.  She has urinary retention at times, then leaks w post-void incontinence.  SHe has dyspareunia most times now.  She has had job w heavy lifting in past, now out of work and caring for 63 yo granddaughter she now has custody over.  Prior NSVD x2.  Menopausal for 10 years now.  Symptoms have gradually worsened.   PMHx: She  has a past medical history of Anemia, Anxiety, Asthma, Basal cell carcinoma of neck, Bipolar disorder ([place]), Daily headache, Depression, Fibromyalgia, Intractable nausea and vomiting, Migraine, Obesity, PTSD (post-traumatic stress disorder), and Thyroid disease. Also,  has a past surgical history that includes Multiple tooth extractions (~ 2011) and Mole removal (~ 2013)., family history includes CAD in her mother; Diabetes in an other family member; Hypertension in an other family member.,  reports that she has quit smoking. Her smoking use included cigarettes. She has a 14.00 pack-year smoking history. She has never used smokeless tobacco. She reports previous drug use. Drug: Marijuana. She reports that she does not drink alcohol.  She has a current medication list which includes the following prescription(s): alum & mag hydroxide-simeth, lidocaine, pantoprazole, promethazine, and [DISCONTINUED] potassium chloride. Also, is allergic to ciprofloxacin.  Review of Systems  Constitutional: Negative for chills, fever and malaise/fatigue.  HENT: Negative for congestion, sinus pain and sore throat.   Eyes: Negative for blurred vision and pain.  Respiratory: Negative for cough and wheezing.   Cardiovascular: Negative for chest pain and leg swelling.  Gastrointestinal: Positive for abdominal pain. Negative for constipation, diarrhea, heartburn, nausea and vomiting.    Genitourinary: Negative for dysuria, frequency, hematuria and urgency.  Musculoskeletal: Negative for back pain, joint pain, myalgias and neck pain.  Skin: Negative for itching and rash.  Neurological: Negative for dizziness, tremors and weakness.  Endo/Heme/Allergies: Does not bruise/bleed easily.  Psychiatric/Behavioral: Negative for depression. The patient is not nervous/anxious and does not have insomnia.     Objective: BP 140/80    Ht 5\' 6"  (1.676 m)    Wt 213 lb (96.6 kg)    LMP 12/16/2011    BMI 34.38 kg/m  Physical Exam Constitutional:      General: She is not in acute distress.    Appearance: She is well-developed.  Genitourinary:     Pelvic exam was performed with patient supine.     Urethra, bladder, vagina and uterus normal.     No vaginal erythema or bleeding.     No cervical motion tenderness, discharge, polyp or nabothian cyst.     Uterus is mobile.     Uterus is not enlarged.     No uterine mass detected.    Uterus is midaxial.     No right or left adnexal mass present.     Right adnexa not tender.     Left adnexa not tender.     Genitourinary Comments: Gr 2 uterine prolapse and Gr 1 cystocele Vaginal opening patulous Uterus small, no adnexal mass  HENT:     Head: Normocephalic and atraumatic.     Nose: Nose normal.  Abdominal:     General: There is no distension.     Palpations: Abdomen is soft.     Tenderness: There is no abdominal tenderness.  Musculoskeletal:        General: Normal range  of motion.  Neurological:     Mental Status: She is alert and oriented to person, place, and time.     Cranial Nerves: No cranial nerve deficit.  Skin:    General: Skin is warm and dry.  Psychiatric:        Attention and Perception: Attention normal.        Mood and Affect: Mood and affect normal.        Speech: Speech normal.        Behavior: Behavior normal.        Thought Content: Thought content normal.        Judgment: Judgment normal.      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  Info gv.    Plan TVH, BSO, AR.    To schedule.  Plan preop for consents and to go over discussion again.  Recovery, restrictions, long term effects discussed.  Barnett Applebaum, MD, [facility], [facility] 09/04/2020  8:53 AM

## 2020-09-04 NOTE — Addendum Note (Signed)
Addended by: Gae Dry on: 09/04/2020 04:07 PM   Modules accepted: Orders

## 2020-09-06 LAB — CYTOLOGY - PAP: Diagnosis: NEGATIVE

## 2020-09-09 ENCOUNTER — Telehealth: Payer: Self-pay | Admitting: Obstetrics & Gynecology

## 2020-09-09 NOTE — Telephone Encounter (Signed)
-----   Message from Gae Dry, MD sent at 09/04/2020  8:50 AM EDT ----- Regarding: SURGERY Sch Oct 26 if possible  Surgery Booking Request Patient Full Name:  Adrienne Garcia  MRN: [id number]  DOB: 05/21/1966  Surgeon: Hoyt Koch, MD  Requested Surgery Date and Time: 10/26 or so Primary Diagnosis AND Code: Uterine prolapse, Dyspareunia, Urinary retention Secondary Diagnosis and Code:   ICD-10-CM  1. Uterine prolapse  N81.4  2. 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 Urinary retention  R33.9  Surgical Procedure: TVH/BSO, Anterior repair RNFA Requested?: No L&D Notification: No Admission Status: same day surgery Length of Surgery: 75 min Special Case Needs: No H&P: Yes Phone Interview???:  Yes Interpreter: No Medical Clearance:  No Special Scheduling Instructions: No Any known health/anesthesia issues, diabetes, sleep apnea, latex allergy, defibrillator/pacemaker?: No Acuity: P3   (P1 highest, P2 delay may cause harm, P3 low, elective gyn, P4 lowest)

## 2020-09-09 NOTE — Telephone Encounter (Signed)
Called patient to schedule TVH/BSO, Anterior repair w Kenton Kingfisher  DOS 10/26  H&P 10/18 @ 2:10   Covid testing 10/22 @ 8-10:30, [address], drive up and wear mask. Advised pt to quarantine until DOS.  Pre-admit phone call appointment to be requested - date and time will be included on H&P paper work. Also all appointments will be updated on pt MyChart. Explained that this appointment has a call window. Based on the time scheduled will indicate if the call will be received within a 4 hour window before 1:00 or after.  Advised that pt may also receive calls from the hospital pharmacy and pre-service center.  Confirmed pt has [employer] as primary insurance. No secondary insurance.

## 2020-09-12 ENCOUNTER — Other Ambulatory Visit: Payer: Self-pay

## 2020-09-12 ENCOUNTER — Ambulatory Visit
Admission: RE | Admit: 2020-09-12 | Discharge: 2020-09-12 | Disposition: A | Discharge status: Home / Self Care | Payer: Medicaid Other | Source: Ambulatory Visit | Attending: Family Medicine | Admitting: Family Medicine

## 2020-09-12 DIAGNOSIS — Z1231 Encounter for screening mammogram for malignant neoplasm of breast: Secondary | ICD-10-CM | POA: Diagnosis not present

## 2020-09-16 ENCOUNTER — Encounter: Payer: Self-pay | Admitting: Obstetrics & Gynecology

## 2020-09-16 ENCOUNTER — Ambulatory Visit (INDEPENDENT_AMBULATORY_CARE_PROVIDER_SITE_OTHER): Payer: Medicaid Other | Admitting: Obstetrics & Gynecology

## 2020-09-16 ENCOUNTER — Other Ambulatory Visit: Payer: Self-pay

## 2020-09-16 VITALS — BP 140/80 | Ht 66.0 in | Wt 216.0 lb

## 2020-09-16 DIAGNOSIS — R339 Retention of urine, unspecified: Secondary | ICD-10-CM

## 2020-09-16 DIAGNOSIS — N9419 Other specified dyspareunia: Secondary | ICD-10-CM

## 2020-09-16 DIAGNOSIS — N814 Uterovaginal prolapse, unspecified: Secondary | ICD-10-CM

## 2020-09-16 MED FILL — PANTOPRAZOLE SOD DR 40 MG T: 40 | 30 days supply | Qty: 60 | Fill #1

## 2020-09-16 NOTE — H&P (View-Only) (Signed)
PRE-OPERATIVE HISTORY AND PHYSICAL EXAM  HPI:  Adrienne Garcia is a 54 y.o. [id number] Patient's last menstrual period was 12/16/2011.; she is being admitted for surgery related to pelvic relaxation.  Her symptoms are related to prolapse and cystocele. Problem started several years ago although worse this year.   Symptoms include: prolapse of tissue with straining and discomfort: moderate.  She has urinary retention at times, then leaks w post-void incontinence.  SHe has dyspareunia most times now.  She has had job w heavy lifting in past, now out of work and caring for 11 yo granddaughter she now has custody over.  Prior NSVD x2.  Menopausal for 10 years now.  Symptoms have gradually worsened.  PMHx: Past Medical History:  Diagnosis Date  . Anemia   . Anxiety   . Asthma   . Basal cell carcinoma of neck    "front of my neck; it was a melanoma"  . Bipolar disorder ([place])   . Daily headache   . Depression   . Fibromyalgia   . Intractable nausea and vomiting   . Migraine    "probably twice/month" (09/19/2015)  . Obesity   . PTSD (post-traumatic stress disorder)   . Thyroid disease    Past Surgical History:  Procedure Laterality Date  . MOLE REMOVAL  ~ 2013   "front side of my neck"  . MULTIPLE TOOTH EXTRACTIONS  ~ 2011   Family History  Problem Relation Age of Onset  . CAD Mother   . Hypertension Other        sibling  . Diabetes Other        sibling  . Breast cancer Sister    Social History   Tobacco Use  . Smoking status: Former Smoker    Packs/day: 0.50    Years: 28.00    Pack years: 14.00    Types: Cigarettes  . Smokeless tobacco: Never Used  . Tobacco comment: "quit smoking ~ 2014"  Vaping Use  . Vaping Use: Never used  Substance Use Topics  . Alcohol use: No  . Drug use: Not Currently    Types: Marijuana    Comment: 09/19/2015 "probably twice/yr":    Current Outpatient Medications:  .  acetaminophen (TYLENOL) 500 MG tablet, Take 1,000 mg by mouth every 6 (six)  hours as needed for moderate pain or headache., Disp: , Rfl:  .  albuterol (VENTOLIN HFA) 108 (90 Base) MCG/ACT inhaler, Inhale 2 puffs into the lungs every 6 (six) hours as needed for wheezing or shortness of breath., Disp: , Rfl:  .  alum & mag hydroxide-simeth (MAALOX ADVANCED MAX ST) 400-400-40 MG/5ML suspension, Take 15 mLs by mouth every 6 (six) hours as needed for indigestion., Disp: 355 mL, Rfl: 0 .  lidocaine (XYLOCAINE) 2 % solution, Use as directed 15 mLs in the mouth or throat every 6 (six) hours as needed (abd pain)., Disp: 100 mL, Rfl: 0 .  pantoprazole (PROTONIX) 40 MG tablet, Take 1 tablet (40 mg total) by mouth 2 (two) times daily., Disp: 60 tablet, Rfl: 3 .  promethazine (PHENERGAN) 25 MG tablet, Take 1 tablet (25 mg total) by mouth every 6 (six) hours as needed for nausea or vomiting., Disp: 30 tablet, Rfl: 0 Allergies: Ciprofloxacin  Review of Systems  Constitutional: Negative for chills, fever and malaise/fatigue.  HENT: Negative for congestion, sinus pain and sore throat.   Eyes: Negative for blurred vision and pain.  Respiratory: Negative for cough and wheezing.   Cardiovascular: Negative for  chest pain and leg swelling.  Gastrointestinal: Negative for abdominal pain, constipation, diarrhea, heartburn, nausea and vomiting.  Genitourinary: Negative for dysuria, frequency, hematuria and urgency.  Musculoskeletal: Negative for back pain, joint pain, myalgias and neck pain.  Skin: Negative for itching and rash.  Neurological: Negative for dizziness, tremors and weakness.  Endo/Heme/Allergies: Does not bruise/bleed easily.  Psychiatric/Behavioral: Negative for depression. The patient is not nervous/anxious and does not have insomnia.     Objective: BP 140/80   Ht 5\' 6"  (1.676 m)   Wt 216 lb (98 kg)   LMP 12/16/2011   BMI 34.86 kg/m   Filed Weights   09/16/20 1355  Weight: 216 lb (98 kg)   Physical Exam Constitutional:      General: She is not in acute distress.     Appearance: She is well-developed.  HENT:     Head: Normocephalic and atraumatic. No laceration.     Right Ear: Hearing normal.     Left Ear: Hearing normal.     Mouth/Throat:     Pharynx: Uvula midline.  Eyes:     Pupils: Pupils are equal, round, and reactive to light.  Neck:     Thyroid: No thyromegaly.  Cardiovascular:     Rate and Rhythm: Normal rate and regular rhythm.     Heart sounds: No murmur heard.  No friction rub. No gallop.   Pulmonary:     Effort: Pulmonary effort is normal. No respiratory distress.     Breath sounds: Normal breath sounds. No wheezing.  Chest:     Breasts:        Right: No mass, skin change or tenderness.        Left: No mass, skin change or tenderness.  Abdominal:     General: Bowel sounds are normal. There is no distension.     Palpations: Abdomen is soft.     Tenderness: There is no abdominal tenderness. There is no rebound.  Musculoskeletal:        General: Normal range of motion.     Cervical back: Normal range of motion and neck supple.  Neurological:     Mental Status: She is alert and oriented to person, place, and time.     Cranial Nerves: No cranial nerve deficit.  Skin:    General: Skin is warm and dry.  Psychiatric:        Judgment: Judgment normal.  Vitals reviewed.     Assessment: 1. Uterine prolapse   2. Urinary retention   3. 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 I have fully informed this patient that surgery may subject her to a variety of discomforts and risks: She understands that most patients have surgery with little difficulty, but problems can happen ranging from minor to fatal. These include nausea, vomiting, pain, bleeding, infection, poor healing, hernia, or formation of adhesions. Unexpected reactions may occur from any drug or anesthetic given. Unintended injury may occur  to other pelvic or abdominal structures such as Fallopian tubes, ovaries, bladder, ureter (tube from kidney to bladder), or bowel. Nerves going from the pelvis to the legs may be injured. Any such injury may require immediate or later additional surgery to correct the problem. Excessive blood loss requiring transfusion is very unlikely but possible. Dangerous blood clots may form in the legs or lungs. Physical and sexual activity will  be restricted in varying degrees for an indeterminate period of time but most often 2-6 weeks.  Finally, she understands that it is impossible to list every possible undesirable effect and that the condition for which surgery is done is not always cured or significantly improved, and in rare cases may be even worse.Ample time was given to answer all questions.  Barnett Applebaum, MD, [facility], [facility] 09/16/2020  2:37 PM

## 2020-09-16 NOTE — Progress Notes (Signed)
PRE-OPERATIVE HISTORY AND PHYSICAL EXAM  HPI:  Adrienne Garcia is a 54 y.o. [id number] Patient's last menstrual period was 12/16/2011.; she is being admitted for surgery related to pelvic relaxation.  Her symptoms are related to prolapse and cystocele. Problem started several years ago although worse this year.   Symptoms include: prolapse of tissue with straining and discomfort: moderate.  She has urinary retention at times, then leaks w post-void incontinence.  SHe has dyspareunia most times now.  She has had job w heavy lifting in past, now out of work and caring for 89 yo granddaughter she now has custody over.  Prior NSVD x2.  Menopausal for 10 years now.  Symptoms have gradually worsened.  PMHx: Past Medical History:  Diagnosis Date  . Anemia   . Anxiety   . Asthma   . Basal cell carcinoma of neck    "front of my neck; it was a melanoma"  . Bipolar disorder ([place])   . Daily headache   . Depression   . Fibromyalgia   . Intractable nausea and vomiting   . Migraine    "probably twice/month" (09/19/2015)  . Obesity   . PTSD (post-traumatic stress disorder)   . Thyroid disease    Past Surgical History:  Procedure Laterality Date  . MOLE REMOVAL  ~ 2013   "front side of my neck"  . MULTIPLE TOOTH EXTRACTIONS  ~ 2011   Family History  Problem Relation Age of Onset  . CAD Mother   . Hypertension Other        sibling  . Diabetes Other        sibling  . Breast cancer Sister    Social History   Tobacco Use  . Smoking status: Former Smoker    Packs/day: 0.50    Years: 28.00    Pack years: 14.00    Types: Cigarettes  . Smokeless tobacco: Never Used  . Tobacco comment: "quit smoking ~ 2014"  Vaping Use  . Vaping Use: Never used  Substance Use Topics  . Alcohol use: No  . Drug use: Not Currently    Types: Marijuana    Comment: 09/19/2015 "probably twice/yr":    Current Outpatient Medications:  .  acetaminophen (TYLENOL) 500 MG tablet, Take 1,000 mg by mouth every 6 (six)  hours as needed for moderate pain or headache., Disp: , Rfl:  .  albuterol (VENTOLIN HFA) 108 (90 Base) MCG/ACT inhaler, Inhale 2 puffs into the lungs every 6 (six) hours as needed for wheezing or shortness of breath., Disp: , Rfl:  .  alum & mag hydroxide-simeth (MAALOX ADVANCED MAX ST) 400-400-40 MG/5ML suspension, Take 15 mLs by mouth every 6 (six) hours as needed for indigestion., Disp: 355 mL, Rfl: 0 .  lidocaine (XYLOCAINE) 2 % solution, Use as directed 15 mLs in the mouth or throat every 6 (six) hours as needed (abd pain)., Disp: 100 mL, Rfl: 0 .  pantoprazole (PROTONIX) 40 MG tablet, Take 1 tablet (40 mg total) by mouth 2 (two) times daily., Disp: 60 tablet, Rfl: 3 .  promethazine (PHENERGAN) 25 MG tablet, Take 1 tablet (25 mg total) by mouth every 6 (six) hours as needed for nausea or vomiting., Disp: 30 tablet, Rfl: 0 Allergies: Ciprofloxacin  Review of Systems  Constitutional: Negative for chills, fever and malaise/fatigue.  HENT: Negative for congestion, sinus pain and sore throat.   Eyes: Negative for blurred vision and pain.  Respiratory: Negative for cough and wheezing.   Cardiovascular: Negative for  chest pain and leg swelling.  Gastrointestinal: Negative for abdominal pain, constipation, diarrhea, heartburn, nausea and vomiting.  Genitourinary: Negative for dysuria, frequency, hematuria and urgency.  Musculoskeletal: Negative for back pain, joint pain, myalgias and neck pain.  Skin: Negative for itching and rash.  Neurological: Negative for dizziness, tremors and weakness.  Endo/Heme/Allergies: Does not bruise/bleed easily.  Psychiatric/Behavioral: Negative for depression. The patient is not nervous/anxious and does not have insomnia.     Objective: BP 140/80   Ht 5\' 6"  (1.676 m)   Wt 216 lb (98 kg)   LMP 12/16/2011   BMI 34.86 kg/m   Filed Weights   09/16/20 1355  Weight: 216 lb (98 kg)   Physical Exam Constitutional:      General: She is not in acute distress.     Appearance: She is well-developed.  HENT:     Head: Normocephalic and atraumatic. No laceration.     Right Ear: Hearing normal.     Left Ear: Hearing normal.     Mouth/Throat:     Pharynx: Uvula midline.  Eyes:     Pupils: Pupils are equal, round, and reactive to light.  Neck:     Thyroid: No thyromegaly.  Cardiovascular:     Rate and Rhythm: Normal rate and regular rhythm.     Heart sounds: No murmur heard.  No friction rub. No gallop.   Pulmonary:     Effort: Pulmonary effort is normal. No respiratory distress.     Breath sounds: Normal breath sounds. No wheezing.  Chest:     Breasts:        Right: No mass, skin change or tenderness.        Left: No mass, skin change or tenderness.  Abdominal:     General: Bowel sounds are normal. There is no distension.     Palpations: Abdomen is soft.     Tenderness: There is no abdominal tenderness. There is no rebound.  Musculoskeletal:        General: Normal range of motion.     Cervical back: Normal range of motion and neck supple.  Neurological:     Mental Status: She is alert and oriented to person, place, and time.     Cranial Nerves: No cranial nerve deficit.  Skin:    General: Skin is warm and dry.  Psychiatric:        Judgment: Judgment normal.  Vitals reviewed.     Assessment: 1. Uterine prolapse   2. Urinary retention   3. 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 I have fully informed this patient that surgery may subject her to a variety of discomforts and risks: She understands that most patients have surgery with little difficulty, but problems can happen ranging from minor to fatal. These include nausea, vomiting, pain, bleeding, infection, poor healing, hernia, or formation of adhesions. Unexpected reactions may occur from any drug or anesthetic given. Unintended injury may occur  to other pelvic or abdominal structures such as Fallopian tubes, ovaries, bladder, ureter (tube from kidney to bladder), or bowel. Nerves going from the pelvis to the legs may be injured. Any such injury may require immediate or later additional surgery to correct the problem. Excessive blood loss requiring transfusion is very unlikely but possible. Dangerous blood clots may form in the legs or lungs. Physical and sexual activity will  be restricted in varying degrees for an indeterminate period of time but most often 2-6 weeks.  Finally, she understands that it is impossible to list every possible undesirable effect and that the condition for which surgery is done is not always cured or significantly improved, and in rare cases may be even worse.Ample time was given to answer all questions.  Barnett Applebaum, MD, [facility], [facility] 09/16/2020  2:37 PM

## 2020-09-16 NOTE — Patient Instructions (Signed)
PRE ADMISSION TESTING For Covid, prior to procedure Friday 9:00-10:00 [facility] entrance (drive up)  Results in 48-72 hours You will not receive notification if test results are negative. If positive for Covid19, your provider will notify you by phone, with additional instructions.   Vaginal Hysterectomy, Care After Refer to this sheet in the next few weeks. These instructions provide you with information about caring for yourself after your procedure. Your health care provider may also give you more specific instructions. Your treatment has been planned according to current medical practices, but problems sometimes occur. Call your health care provider if you have any problems or questions after your procedure. What can I expect after the procedure? After the procedure, it is common to have:  Pain.  Soreness and numbness in your incision areas.  Vaginal bleeding and discharge.  Constipation.  Temporary problems emptying the bladder.  Feelings of sadness or other emotions. Follow these instructions at home: Medicines  Take over-the-counter and prescription medicines only as told by your health care provider.  If you were prescribed an antibiotic medicine, take it as told by your health care provider. Do not stop taking the antibiotic even if you start to feel better.  Do not drive or operate heavy machinery while taking prescription pain medicine. Activity  Return to your normal activities as told by your health care provider. Ask your health care provider what activities are safe for you.  Get regular exercise as told by your health care provider. You may be told to take short walks every day and go farther each time.  Do not lift anything that is heavier than 10 lb (4.5 kg). General instructions   Do not put anything in your vagina for 6 weeks after your surgery or as told by your health care provider. This includes tampons and douches.  Do not have sex  until your health care provider says you can.  Do not take baths, swim, or use a hot tub until your health care provider approves.  Drink enough fluid to keep your urine clear or pale yellow.  Do not drive for 24 hours if you were given a sedative.  Keep all follow-up visits as told by your health care provider. This is important. Contact a health care provider if:  Your pain medicine is not helping.  You have a fever.  You have redness, swelling, or pain at your incision site.  You have blood, pus, or a bad-smelling discharge from your vagina.  You continue to have difficulty urinating. Get help right away if:  You have severe abdominal or back pain.  You have heavy bleeding from your vagina.  You have chest pain or shortness of breath. This information is not intended to replace advice given to you by your health care provider. Make sure you discuss any questions you have with your health care provider. Document Revised: 07/09/2016 Document Reviewed: 12/01/2015 Elsevier Patient Education  2020 [employer].

## 2020-09-17 ENCOUNTER — Encounter
Admission: RE | Admit: 2020-09-17 | Discharge: 2020-09-17 | Disposition: A | Discharge status: Home / Self Care | Payer: Medicaid Other | Source: Ambulatory Visit | Attending: Obstetrics & Gynecology | Admitting: Obstetrics & Gynecology

## 2020-09-17 HISTORY — DX: Gastro-esophageal reflux disease without esophagitis: K21.9

## 2020-09-17 HISTORY — DX: Family history of other specified conditions: Z84.89

## 2020-09-17 HISTORY — DX: Cardiac murmur, unspecified: R01.1

## 2020-09-17 HISTORY — DX: Personal history of other diseases of the digestive system: Z87.19

## 2020-09-17 NOTE — Patient Instructions (Addendum)
Your procedure is scheduled on:09-24-20 TUESDAY Report to Day Surgery on the 2nd floor of the [facility]. To find out your arrival time, please call [phone] between 1PM - 3PM on: 09-23-20 MONDAY  REMEMBER: Instructions that are not followed completely may result in serious medical risk, up to and including death; or upon the discretion of your surgeon and anesthesiologist your surgery may need to be rescheduled.  Do not eat food after midnight the night before surgery.  No gum chewing, lozengers or hard candies.  You may however, drink CLEAR liquids up to 2 hours before you are scheduled to arrive for your surgery. Do not drink anything within 2 hours of your scheduled arrival time.  Clear liquids include: - water  - apple juice without pulp - gatorade (not RED, PURPLE, OR BLUE) - black coffee or tea (Do NOT add milk or creamers to the coffee or tea) Do NOT drink anything that is not on this list.  TAKE THESE MEDICATIONS THE MORNING OF SURGERY WITH A SIP OF WATER:  -PROTONIX (PANTOPRAZOLE)-take one the night before and one on the morning of surgery - helps to prevent nausea after surgery.)  Use inhalers on the day of surgery and bring to the [facility]  One week prior to surgery: Stop Anti-inflammatories (NSAIDS) such as Advil, Aleve, Ibuprofen, Motrin, Naproxen, Naprosyn and Aspirin based products such as Excedrin, Goodys Powder, BC Powder-OK TO TAKE TYLENOL IF NEEDED  Stop ANY OVER THE COUNTER supplements until after surgery.  No Alcohol for 24 hours before or after surgery.  No Smoking including e-cigarettes for 24 hours prior to surgery.  No chewable tobacco products for at least 6 hours prior to surgery.  No nicotine patches on the day of surgery.  Do not use any "recreational" drugs for at least a week prior to your surgery.  Please be advised that the combination of cocaine and  anesthesia may have negative outcomes, up to and including death. If you test positive for cocaine, your surgery will be cancelled.  On the morning of surgery brush your teeth with toothpaste and water, you may rinse your mouth with mouthwash if you wish. Do not swallow any toothpaste or mouthwash.  Do not wear jewelry, make-up, hairpins, clips or nail polish.  Do not wear lotions, powders, or perfumes.   Do not shave 48 hours prior to surgery.   Contact lenses, hearing aids and dentures may not be worn into surgery.  Do not bring valuables to the hospital. [facility] is not responsible for any missing/lost belongings or valuables.   Notify your doctor if there is any change in your medical condition (cold, fever, infection).  Wear comfortable clothing (specific to your surgery type) to the hospital.  Plan for stool softeners for home use; pain medications have a tendency to cause constipation. You can also help prevent constipation by eating foods high in fiber such as fruits and vegetables and drinking plenty of fluids as your diet allows.  After surgery, you can help prevent lung complications by doing breathing exercises.  Take deep breaths and cough every 1-2 hours. Your doctor may order a device called an Incentive Spirometer to help you take deep breaths. When coughing or sneezing, hold a pillow firmly against your incision with both hands. This is called "splinting." Doing this helps protect your incision. It also decreases belly discomfort.  If you are being admitted to the hospital  overnight, leave your suitcase in the car. After surgery it may be brought to your room.  If you are being discharged the day of surgery, you will not be allowed to drive home. You will need a responsible adult (18 years or older) to drive you home and stay with you that night.   If you are taking public transportation, you will need to have a responsible adult (18 years or older) with  you. Please confirm with your physician that it is acceptable to use public transportation.   Please call the [facility] at [phone] if you have any questions about these instructions.  Visitation Policy:  Patients undergoing a surgery or procedure may have one family member or support person with them as long as that person is not COVID-19 positive or experiencing its symptoms.  That person may remain in the waiting area during the procedure.  Inpatient Visitation Update:   In an effort to ensure the safety of our team members and our patients, we are implementing a change to our visitation policy:  Effective Monday, Aug. 9, at 7 a.m., inpatients will be allowed one support person.  o The support person may change daily.  o The support person must pass our screening, gel in and out, and wear a mask at all times, including in the patient's room.  o Patients must also wear a mask when staff or their support person are in the room.  o Masking is required regardless of vaccination status.  Systemwide, no visitors 17 or younger.

## 2020-09-20 ENCOUNTER — Other Ambulatory Visit
Admission: RE | Admit: 2020-09-20 | Discharge: 2020-09-20 | Disposition: A | Discharge status: Home / Self Care | Payer: Medicaid Other | Source: Ambulatory Visit | Attending: Obstetrics and Gynecology | Admitting: Obstetrics and Gynecology

## 2020-09-20 ENCOUNTER — Other Ambulatory Visit: Payer: Self-pay

## 2020-09-20 DIAGNOSIS — Z20822 Contact with and (suspected) exposure to covid-19: Secondary | ICD-10-CM | POA: Insufficient documentation

## 2020-09-20 DIAGNOSIS — Z01812 Encounter for preprocedural laboratory examination: Secondary | ICD-10-CM | POA: Diagnosis not present

## 2020-09-20 LAB — CBC
HCT: 38.3 % (ref 36.0–46.0)
Hemoglobin: 12.7 g/dL (ref 12.0–15.0)
MCH: 31 pg (ref 26.0–34.0)
MCHC: 33.2 g/dL (ref 30.0–36.0)
MCV: 93.4 fL (ref 80.0–100.0)
Platelets: 170 10*3/uL (ref 150–400)
RBC: 4.1 MIL/uL (ref 3.87–5.11)
RDW: 13.6 % (ref 11.5–15.5)
WBC: 7.2 10*3/uL (ref 4.0–10.5)
nRBC: 0 % (ref 0.0–0.2)

## 2020-09-20 LAB — TYPE AND SCREEN
ABO/RH(D): O POS
Antibody Screen: NEGATIVE

## 2020-09-21 LAB — SARS CORONAVIRUS 2 (TAT 6-24 HRS): SARS Coronavirus 2: NEGATIVE

## 2020-09-24 ENCOUNTER — Encounter: Payer: Self-pay | Admitting: Obstetrics & Gynecology

## 2020-09-24 ENCOUNTER — Ambulatory Visit
Admission: RE | Admit: 2020-09-24 | Discharge: 2020-09-24 | Disposition: A | Discharge status: Home / Self Care | Payer: Medicaid Other | Attending: Obstetrics & Gynecology | Admitting: Obstetrics & Gynecology

## 2020-09-24 ENCOUNTER — Ambulatory Visit: Payer: Medicaid Other | Admitting: Registered Nurse

## 2020-09-24 ENCOUNTER — Other Ambulatory Visit: Payer: Self-pay

## 2020-09-24 ENCOUNTER — Encounter
Admission: RE | Disposition: A | Discharge status: Home / Self Care | Payer: Self-pay | Source: Home / Self Care | Attending: Obstetrics & Gynecology

## 2020-09-24 ENCOUNTER — Other Ambulatory Visit: Payer: Self-pay | Admitting: Obstetrics & Gynecology

## 2020-09-24 DIAGNOSIS — Z87891 Personal history of nicotine dependence: Secondary | ICD-10-CM | POA: Diagnosis not present

## 2020-09-24 DIAGNOSIS — N812 Incomplete uterovaginal prolapse: Secondary | ICD-10-CM | POA: Insufficient documentation

## 2020-09-24 DIAGNOSIS — D259 Leiomyoma of uterus, unspecified: Secondary | ICD-10-CM | POA: Diagnosis not present

## 2020-09-24 DIAGNOSIS — R339 Retention of urine, unspecified: Secondary | ICD-10-CM | POA: Diagnosis not present

## 2020-09-24 DIAGNOSIS — N800 Endometriosis of uterus: Secondary | ICD-10-CM | POA: Diagnosis not present

## 2020-09-24 DIAGNOSIS — N9419 Other specified dyspareunia: Secondary | ICD-10-CM | POA: Insufficient documentation

## 2020-09-24 DIAGNOSIS — N814 Uterovaginal prolapse, unspecified: Secondary | ICD-10-CM

## 2020-09-24 HISTORY — PX: CYSTOCELE REPAIR: SHX163

## 2020-09-24 HISTORY — PX: VAGINAL HYSTERECTOMY: SHX2639

## 2020-09-24 LAB — ABO/RH: ABO/RH(D): O POS

## 2020-09-24 SURGERY — HYSTERECTOMY, VAGINAL
Anesthesia: General

## 2020-09-24 MED ORDER — OXYCODONE-ACETAMINOPHEN 5-325 MG PO TABS: 1.0000 | ORAL_TABLET | ORAL | 0 refills | Status: DC | PRN

## 2020-09-24 MED ORDER — ROCURONIUM BROMIDE 10 MG/ML (PF) SYRINGE
PREFILLED_SYRINGE | INTRAVENOUS | Status: AC
  Filled 2020-09-24: qty 20

## 2020-09-24 MED ORDER — SODIUM CHLORIDE FLUSH 0.9 % IV SOLN
INTRAVENOUS | Status: AC
  Filled 2020-09-24: qty 3

## 2020-09-24 MED ORDER — ONDANSETRON HCL 4 MG/2ML IJ SOLN
INTRAMUSCULAR | Status: AC
  Filled 2020-09-24: qty 2

## 2020-09-24 MED ORDER — LIDOCAINE HCL (CARDIAC) PF 100 MG/5ML IV SOSY
PREFILLED_SYRINGE | INTRAVENOUS | Status: DC | PRN
  Administered 2020-09-24: 100 mg via INTRAVENOUS

## 2020-09-24 MED ORDER — PROMETHAZINE HCL 25 MG/ML IJ SOLN
6.2500 mg | Freq: Once | INTRAMUSCULAR | Status: AC
  Administered 2020-09-24: 6.25 mg via INTRAVENOUS

## 2020-09-24 MED ORDER — MIDAZOLAM HCL 2 MG/2ML IJ SOLN
INTRAMUSCULAR | Status: DC | PRN
  Administered 2020-09-24: 2 mg via INTRAVENOUS

## 2020-09-24 MED ORDER — MORPHINE SULFATE (PF) 2 MG/ML IV SOLN: 1.0000 mg | INTRAVENOUS | Status: DC | PRN

## 2020-09-24 MED ORDER — HYDROCORTISONE NA SUCCINATE PF 100 MG IJ SOLR
INTRAMUSCULAR | Status: DC | PRN
  Administered 2020-09-24: 100 mg via INTRAVENOUS

## 2020-09-24 MED ORDER — LIDOCAINE-EPINEPHRINE 1 %-1:100000 IJ SOLN
INTRAMUSCULAR | Status: AC
  Filled 2020-09-24: qty 1

## 2020-09-24 MED ORDER — LACTATED RINGERS IV SOLN: INTRAVENOUS | Status: DC

## 2020-09-24 MED ORDER — SILVER NITRATE-POT NITRATE 75-25 % EX MISC
CUTANEOUS | Status: AC
  Filled 2020-09-24: qty 10

## 2020-09-24 MED ORDER — PROPOFOL 10 MG/ML IV BOLUS
INTRAVENOUS | Status: DC | PRN
  Administered 2020-09-24: 200 mg via INTRAVENOUS

## 2020-09-24 MED ORDER — CHLORHEXIDINE GLUCONATE 0.12 % MT SOLN
OROMUCOSAL | Status: AC
  Administered 2020-09-24: 15 mL via OROMUCOSAL
  Filled 2020-09-24: qty 15

## 2020-09-24 MED ORDER — SODIUM CHLORIDE 0.9 % IV SOLN
INTRAVENOUS | Status: AC
  Filled 2020-09-24: qty 2

## 2020-09-24 MED ORDER — SODIUM CHLORIDE 0.9 % IV SOLN
2.0000 g | INTRAVENOUS | Status: AC
  Administered 2020-09-24: 2 g via INTRAVENOUS

## 2020-09-24 MED ORDER — CHLORHEXIDINE GLUCONATE 0.12 % MT SOLN: 15.0000 mL | Freq: Once | OROMUCOSAL | Status: AC

## 2020-09-24 MED ORDER — DEXAMETHASONE SODIUM PHOSPHATE 10 MG/ML IJ SOLN
INTRAMUSCULAR | Status: AC
  Filled 2020-09-24: qty 1

## 2020-09-24 MED ORDER — ACETAMINOPHEN 10 MG/ML IV SOLN
INTRAVENOUS | Status: DC | PRN
  Administered 2020-09-24: 1000 mg via INTRAVENOUS

## 2020-09-24 MED ORDER — LIDOCAINE HCL (PF) 2 % IJ SOLN
INTRAMUSCULAR | Status: AC
  Filled 2020-09-24: qty 5

## 2020-09-24 MED ORDER — FENTANYL CITRATE (PF) 100 MCG/2ML IJ SOLN
INTRAMUSCULAR | Status: AC
  Filled 2020-09-24: qty 2

## 2020-09-24 MED ORDER — FENTANYL CITRATE (PF) 250 MCG/5ML IJ SOLN
INTRAMUSCULAR | Status: AC
  Filled 2020-09-24: qty 5

## 2020-09-24 MED ORDER — ROCURONIUM BROMIDE 10 MG/ML (PF) SYRINGE
PREFILLED_SYRINGE | INTRAVENOUS | Status: AC
  Filled 2020-09-24: qty 10

## 2020-09-24 MED ORDER — DEXMEDETOMIDINE (PRECEDEX) IN NS 20 MCG/5ML (4 MCG/ML) IV SYRINGE
PREFILLED_SYRINGE | INTRAVENOUS | Status: AC
  Filled 2020-09-24: qty 5

## 2020-09-24 MED ORDER — SUGAMMADEX SODIUM 200 MG/2ML IV SOLN
INTRAVENOUS | Status: DC | PRN
  Administered 2020-09-24: 200 mg via INTRAVENOUS

## 2020-09-24 MED ORDER — KETOROLAC TROMETHAMINE 30 MG/ML IJ SOLN
INTRAMUSCULAR | Status: DC | PRN
  Administered 2020-09-24: 30 mg via INTRAVENOUS

## 2020-09-24 MED ORDER — ESTROGENS, CONJUGATED 0.625 MG/GM VA CREA
TOPICAL_CREAM | VAGINAL | Status: AC
  Filled 2020-09-24: qty 30

## 2020-09-24 MED ORDER — PROPOFOL 10 MG/ML IV BOLUS
INTRAVENOUS | Status: AC
  Filled 2020-09-24: qty 20

## 2020-09-24 MED ORDER — ACETAMINOPHEN 650 MG RE SUPP
650.0000 mg | RECTAL | Status: DC | PRN
  Filled 2020-09-24: qty 1

## 2020-09-24 MED ORDER — FENTANYL CITRATE (PF) 100 MCG/2ML IJ SOLN
INTRAMUSCULAR | Status: DC | PRN
  Administered 2020-09-24 (×2): 25 ug via INTRAVENOUS
  Administered 2020-09-24 (×2): 50 ug via INTRAVENOUS
  Administered 2020-09-24: 100 ug via INTRAVENOUS

## 2020-09-24 MED ORDER — BACITRACIN ZINC 500 UNIT/GM EX OINT
TOPICAL_OINTMENT | CUTANEOUS | Status: DC | PRN
  Administered 2020-09-24: 1 via TOPICAL

## 2020-09-24 MED ORDER — DEXMEDETOMIDINE HCL 200 MCG/2ML IV SOLN
INTRAVENOUS | Status: DC | PRN
  Administered 2020-09-24: 4 ug via INTRAVENOUS
  Administered 2020-09-24: 8 ug via INTRAVENOUS
  Administered 2020-09-24: 4 ug via INTRAVENOUS

## 2020-09-24 MED ORDER — MIDAZOLAM HCL 2 MG/2ML IJ SOLN
INTRAMUSCULAR | Status: AC
  Filled 2020-09-24: qty 2

## 2020-09-24 MED ORDER — FENTANYL CITRATE (PF) 100 MCG/2ML IJ SOLN
25.0000 ug | INTRAMUSCULAR | Status: DC | PRN
  Administered 2020-09-24 (×7): 25 ug via INTRAVENOUS

## 2020-09-24 MED ORDER — SUCCINYLCHOLINE CHLORIDE 200 MG/10ML IV SOSY
PREFILLED_SYRINGE | INTRAVENOUS | Status: AC
  Filled 2020-09-24: qty 20

## 2020-09-24 MED ORDER — GLYCOPYRROLATE 0.2 MG/ML IJ SOLN
INTRAMUSCULAR | Status: DC | PRN
  Administered 2020-09-24: .2 mg via INTRAVENOUS

## 2020-09-24 MED ORDER — ONDANSETRON HCL 4 MG/2ML IJ SOLN
4.0000 mg | Freq: Once | INTRAMUSCULAR | Status: AC | PRN
  Administered 2020-09-24: 4 mg via INTRAVENOUS

## 2020-09-24 MED ORDER — KETOROLAC TROMETHAMINE 30 MG/ML IJ SOLN
INTRAMUSCULAR | Status: AC
  Filled 2020-09-24: qty 1

## 2020-09-24 MED ORDER — ROCURONIUM BROMIDE 100 MG/10ML IV SOLN
INTRAVENOUS | Status: DC | PRN
  Administered 2020-09-24: 50 mg via INTRAVENOUS

## 2020-09-24 MED ORDER — ACETAMINOPHEN 325 MG PO TABS: 650.0000 mg | ORAL_TABLET | ORAL | Status: DC | PRN

## 2020-09-24 MED ORDER — ONDANSETRON HCL 4 MG/2ML IJ SOLN
INTRAMUSCULAR | Status: DC | PRN
  Administered 2020-09-24: 4 mg via INTRAVENOUS

## 2020-09-24 MED ORDER — OXYCODONE-ACETAMINOPHEN 5-325 MG PO TABS: 1.0000 | ORAL_TABLET | ORAL | Status: DC | PRN

## 2020-09-24 MED ORDER — POVIDONE-IODINE 10 % EX SWAB: 2.0000 "application " | Freq: Once | CUTANEOUS | Status: DC

## 2020-09-24 MED ORDER — PROMETHAZINE HCL 25 MG/ML IJ SOLN
INTRAMUSCULAR | Status: AC
  Filled 2020-09-24: qty 1

## 2020-09-24 MED ORDER — DEXAMETHASONE SODIUM PHOSPHATE 10 MG/ML IJ SOLN
INTRAMUSCULAR | Status: DC | PRN
  Administered 2020-09-24: 10 mg via INTRAVENOUS

## 2020-09-24 MED ORDER — ORAL CARE MOUTH RINSE: 15.0000 mL | Freq: Once | OROMUCOSAL | Status: AC

## 2020-09-24 MED ORDER — ACETAMINOPHEN 10 MG/ML IV SOLN
INTRAVENOUS | Status: AC
  Filled 2020-09-24: qty 100

## 2020-09-24 MED ORDER — LIDOCAINE-EPINEPHRINE 1 %-1:100000 IJ SOLN
INTRAMUSCULAR | Status: DC | PRN
  Administered 2020-09-24: 10 mL
  Administered 2020-09-24: 5 mL

## 2020-09-24 SURGICAL SUPPLY — 44 items
BAG DRN RND TRDRP ANRFLXCHMBR (UROLOGICAL SUPPLIES) ×2
BAG URINE DRAIN 2000ML AR STRL (UROLOGICAL SUPPLIES) ×4 IMPLANT
BLADE SURG SZ10 CARB STEEL (BLADE) ×4 IMPLANT
CANISTER SUCT 1200ML W/VALVE (MISCELLANEOUS) ×4 IMPLANT
CATH FOLEY 2WAY  5CC 16FR (CATHETERS) ×4
CATH FOLEY 2WAY 5CC 16FR (CATHETERS) ×2
CATH URTH 16FR FL 2W BLN LF (CATHETERS) ×2 IMPLANT
COVER WAND RF STERILE (DRAPES) IMPLANT
DRAPE 3/4 80X56 (DRAPES) ×4 IMPLANT
DRAPE PERI LITHO V/GYN (MISCELLANEOUS) ×4 IMPLANT
DRAPE UNDER BUTTOCK W/FLU (DRAPES) ×4 IMPLANT
ELECT CAUTERY BLADE 6.4 (BLADE) ×4 IMPLANT
ELECT REM PT RETURN 9FT ADLT (ELECTROSURGICAL) ×4
ELECTRODE REM PT RTRN 9FT ADLT (ELECTROSURGICAL) ×2 IMPLANT
GAUZE 4X4 16PLY RFD (DISPOSABLE) ×4 IMPLANT
GAUZE PACK 2X3YD (PACKING) IMPLANT
GAUZE PACKING 1/2X5YD (GAUZE/BANDAGES/DRESSINGS) ×4 IMPLANT
GLOVE BIO SURGEON STRL SZ8 (GLOVE) ×4 IMPLANT
GOWN STRL REUS W/ TWL LRG LVL3 (GOWN DISPOSABLE) ×2 IMPLANT
GOWN STRL REUS W/ TWL XL LVL3 (GOWN DISPOSABLE) ×2 IMPLANT
GOWN STRL REUS W/TWL LRG LVL3 (GOWN DISPOSABLE) ×4
GOWN STRL REUS W/TWL XL LVL3 (GOWN DISPOSABLE) ×4
LABEL OR SOLS (LABEL) ×4 IMPLANT
NDL MAYO CATGUT SZ4 (NEEDLE) ×4 IMPLANT
NEEDLE HYPO 22GX1.5 SAFETY (NEEDLE) ×4 IMPLANT
NS IRRIG 500ML POUR BTL (IV SOLUTION) ×4 IMPLANT
PACK BASIN MINOR (MISCELLANEOUS) ×4 IMPLANT
PAD OB MATERNITY 4.3X12.25 (PERSONAL CARE ITEMS) ×4 IMPLANT
PAD PREP 24X41 OB/GYN DISP (PERSONAL CARE ITEMS) ×4 IMPLANT
RETRACTOR PHONTONGUIDE ADAPT (ADAPTER) IMPLANT
SOL PREP PVP 2OZ (MISCELLANEOUS) ×4
SOLUTION PREP PVP 2OZ (MISCELLANEOUS) ×2 IMPLANT
SPONGE LAP 4X18 RFD (DISPOSABLE) ×4 IMPLANT
STRAP SAFETY 5IN WIDE (MISCELLANEOUS) ×4 IMPLANT
SURGILUBE 2OZ TUBE FLIPTOP (MISCELLANEOUS) ×4 IMPLANT
SUT ETHIBOND NAB CT1 #1 30IN (SUTURE) ×4 IMPLANT
SUT VIC AB 0 CT1 27 (SUTURE) ×12
SUT VIC AB 0 CT1 27XCR 8 STRN (SUTURE) ×6 IMPLANT
SUT VIC AB 0 CT1 36 (SUTURE) ×4 IMPLANT
SUT VIC AB 1 CT1 36 (SUTURE) ×4 IMPLANT
SUT VIC AB 2-0 CT1 (SUTURE) ×8 IMPLANT
SYR 10ML LL (SYRINGE) ×8 IMPLANT
SYR BULB IRRIG 60ML STRL (SYRINGE) ×4 IMPLANT
TAPE TRANSPORE STRL 2 31045 (GAUZE/BANDAGES/DRESSINGS) ×4 IMPLANT

## 2020-09-24 NOTE — Interval H&P Note (Signed)
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  The various methods of treatment have been discussed with the patient and family. After consideration of risks, benefits and other options for treatment, the patient has consented to  Procedure(s): HYSTERECTOMY VAGINAL BILATERAL SALPINGO-OOPHERECTOMY (Bilateral) ANTERIOR REPAIR (CYSTOCELE) (N/A) as a surgical intervention.  The patient's history has been reviewed, patient examined, no change in status, stable for surgery.  I have reviewed the patient's chart and labs.  Questions were answered to the patient's satisfaction.     Hoyt Koch

## 2020-09-24 NOTE — Discharge Instructions (Signed)
Follow-up Information    Gae Dry, MD. Go in 1 week.   Specialty: Obstetrics and Gynecology Why: For Post Op, As Scheduled Contact information: [address] [place] [address] [phone]              AMBULATORY SURGERY  DISCHARGE INSTRUCTIONS   1) The drugs that you were given will stay in your system until tomorrow so for the next 24 hours you should not:  A) Drive an automobile B) Make any legal decisions C) Drink any alcoholic beverage   2) You may resume regular meals tomorrow.  Today it is better to start with liquids and gradually work up to solid foods.  You may eat anything you prefer, but it is better to start with liquids, then soup and crackers, and gradually work up to solid foods.   3) Please notify your doctor immediately if you have any unusual bleeding, trouble breathing, redness and pain at the surgery site, drainage, fever, or pain not relieved by medication. 4)   5) Your post-operative visit with Dr.                                     is: Date:                        Time:    Please call to schedule your post-operative visit.  6) Additional Instructions:     Vaginal Hysterectomy, Care After Refer to this sheet in the next few weeks. These instructions provide you with information about caring for yourself after your procedure. Your health care provider may also give you more specific instructions. Your treatment has been planned according to current medical practices, but problems sometimes occur. Call your health care provider if you have any problems or questions after your procedure. What can I expect after the procedure? After the procedure, it is common to have:  Pain.  Soreness and numbness in your incision areas.  Vaginal bleeding and discharge.  Constipation.  Temporary problems emptying the bladder.  Feelings of sadness or other emotions. Follow these instructions at home: Medicines  Take  over-the-counter and prescription medicines only as told by your health care provider.  If you were prescribed an antibiotic medicine, take it as told by your health care provider. Do not stop taking the antibiotic even if you start to feel better.  Do not drive or operate heavy machinery while taking prescription pain medicine. Activity  Return to your normal activities as told by your health care provider. Ask your health care provider what activities are safe for you.  Get regular exercise as told by your health care provider. You may be told to take short walks every day and go farther each time.  Do not lift anything that is heavier than 10 lb (4.5 kg). General instructions   Do not put anything in your vagina for 6 weeks after your surgery or as told by your health care provider. This includes tampons and douches.  Do not have sex until your health care provider says you can.  Do not take baths, swim, or use a hot tub until your health care provider approves.  Drink enough fluid to keep your urine clear or pale yellow.  Do not drive for 24 hours if you were given a sedative.  Keep all follow-up visits as told by your health care  provider. This is important. Contact a health care provider if:  Your pain medicine is not helping.  You have a fever.  You have redness, swelling, or pain at your incision site.  You have blood, pus, or a bad-smelling discharge from your vagina.  You continue to have difficulty urinating. Get help right away if:  You have severe abdominal or back pain.  You have heavy bleeding from your vagina.  You have chest pain or shortness of breath. This information is not intended to replace advice given to you by your health care provider. Make sure you discuss any questions you have with your health care provider. Document Revised: 07/09/2016 Document Reviewed: 12/01/2015 Elsevier Patient Education  2020 [employer].

## 2020-09-24 NOTE — Anesthesia Procedure Notes (Signed)
Procedure Name: Intubation Date/Time: 09/24/2020 1:59 PM Performed by: Hedda Slade, CRNA Pre-anesthesia Checklist: Patient identified, Patient being monitored, Timeout performed, Emergency Drugs available and Suction available Patient Re-evaluated:Patient Re-evaluated prior to induction Oxygen Delivery Method: Circle system utilized Preoxygenation: Pre-oxygenation with 100% oxygen Induction Type: IV induction Ventilation: Mask ventilation without difficulty and Oral airway inserted - appropriate to patient size Laryngoscope Size: 3 and McGraph Grade View: Grade I Tube type: Oral Tube size: 7.0 mm Number of attempts: 1 Airway Equipment and Method: Stylet and Video-laryngoscopy Placement Confirmation: ETT inserted through vocal cords under direct vision,  positive ETCO2 and breath sounds checked- equal and bilateral Secured at: 21 cm Tube secured with: Tape Dental Injury: Teeth and Oropharynx as per pre-operative assessment

## 2020-09-24 NOTE — Op Note (Signed)
Preoperative diagnosis: Uterine Prolapse, Dyspareunia, Urinary retention  Postoperative diagnosis: Same  Procedure: Transvaginal hysterectomy, Bilateral salpingo-oophorectomy, Anterior colporrhaphy   Surgeon: Barnett Applebaum, M.D.  Assistant: Cari Caraway, MD, No other capable assistant available, in surgery requiring high level assistant.  Anesthesia: general  Findings: Small uterus w gr 2 prolpase.  Estimated blood loss: 30 cc  Specimen: Uterus, tubes, and ovaries to pathology   Disposition: Tolerated procedure well  Procedure: Patient was taken to the OR where she was placed in dorsal lithotomy in candy cane stirrups. She was prepped and draped in the usual sterile fashion. A timeout was performed. Foley is placed into bladder. A speculum was placed inside the vagina. The cervix was visualized and grasped with a tenaculum. 1% lidocaine with epinephrine were injected paracervically. A bovie was used to make a circumferential incision around the vagina. An opened sponge was used to dissect the vagina off the cervix. The posterior peritoneum was entered sharply with Mayo scissors.  The anterior peritoneal cavity was entered sharply with careful dissection of the bladder off the underlying cervix. A Heaney clamp was used to clamp first the left uterosacral ligament and cardinal which was then cut and Haney suture ligated with 0 Vicryl stitch, the stitch was held and later attached to the vaginal mucosa. Similarly the right uterosacral ligament was clamped cut and suture ligated.  Sequential clamping, transecting and suture ligating up the broad to the uterine arteries were taken until the tubo-ovarian pedicles were encountered. The uterus was then amputated. The left utero-ovarian pedicle grasped with a Heaney clamp. The right utero-ovarian pedicle was similarly grasped with the Heaney clamp. The right and left ovaries were easily visualized. The left ovary and tube were grasped with Babcock clamp and a  Heaney clamp placed behind this. Tube and ovary were removed and the IP ligated with a free tie followed by suture ligature. The right tube and ovary were similarly grasped with a Babcock clamp and a Heaney clamp used to clamp behind this. The tube and ovary were removed on the side and the IP was ligated with a free tie followed by suture ligature. Inspection of all pedicles revealed adequate hemostasis.  The peritoneum was then closed with a running pursestring suture of 0 Vicryl.   Anterior colporrhaphy is performed.  Allis clamps are placed along the anterior vaginal wall, lidocaine is used to infiltrate the plane, and incision is made midline vertical.  Endopelvic fascia is dissected free of vaginal mucosa.  Fascia is plicated w interrupted vicryl sutures.  Excess mucosa is excised.  Vaginal incision is closed with a running locking Vicryl suture, to incoprate the hysterectomy incision as well, with care taken to incorporate the uterosacral pedicles. Excellent hemostasis was noted at the end of the case. The vaginal cuff was inspected there was minimal bleeding noted.  Irrigation performed.   Packing gauze w Bactracin cream is placed. A Foley catheter is removed after 200 mL saline instilled into bladder. All instrument needle and lap counts were correct x 2. Patient was awakened taken to recovery room in stable condition.  The assistance of my assisting-physician was vital to resect and retract interchangably with self on each side.  Hoyt Koch, MD 09/24/2020, 3:31 PM

## 2020-09-24 NOTE — Anesthesia Preprocedure Evaluation (Addendum)
Anesthesia Evaluation  Patient identified by MRN, date of birth, ID band Patient awake    Reviewed: Allergy & Precautions, NPO status , Patient's Chart, lab work & pertinent test results  Airway Mallampati: III  TM Distance: <3 FB     Dental  (+) Upper Dentures, Lower Dentures   Pulmonary asthma , former smoker,    Pulmonary exam normal        Cardiovascular Normal cardiovascular exam+ Valvular Problems/Murmurs      Neuro/Psych  Headaches, PSYCHIATRIC DISORDERS Anxiety Depression Bipolar Disorder    GI/Hepatic Neg liver ROS, hiatal hernia, GERD  ,  Endo/Other  Hypothyroidism   Renal/GU negative Renal ROS  Female GU complaint     Musculoskeletal  (+) Fibromyalgia -  Abdominal Normal abdominal exam  (+)   Peds negative pediatric ROS (+)  Hematology  (+) anemia ,   Anesthesia Other Findings   Reproductive/Obstetrics                            Anesthesia Physical Anesthesia Plan  ASA: II  Anesthesia Plan: General   Post-op Pain Management:    Induction: Intravenous  PONV Risk Score and Plan:   Airway Management Planned: Oral ETT  Additional Equipment:   Intra-op Plan:   Post-operative Plan: Extubation in OR  Informed Consent: I have reviewed the patients History and Physical, chart, labs and discussed the procedure including the risks, benefits and alternatives for the proposed anesthesia with the patient or authorized representative who has indicated his/her understanding and acceptance.     Dental advisory given  Plan Discussed with: CRNA and Surgeon  Anesthesia Plan Comments:         Anesthesia Quick Evaluation

## 2020-09-24 NOTE — Transfer of Care (Signed)
Immediate Anesthesia Transfer of Care Note  Patient: Adrienne Garcia  Procedure(s) Performed: HYSTERECTOMY VAGINAL BILATERAL SALPINGO-OOPHERECTOMY (Bilateral ) ANTERIOR REPAIR (CYSTOCELE) (N/A )  Patient Location: PACU  Anesthesia Type:General  Level of Consciousness: sedated  Airway & Oxygen Therapy: Patient Spontanous Breathing and Patient connected to face mask oxygen  Post-op Assessment: Report given to RN and Post -op Vital signs reviewed and stable  Post vital signs: Reviewed and stable  Last Vitals:  Vitals Value Taken Time  BP 98/55 09/24/20 1532  Temp    Pulse 53 09/24/20 1532  Resp 19 09/24/20 1532  SpO2 96 % 09/24/20 1532    Last Pain:  Vitals:   09/24/20 1532  TempSrc:   PainSc: 0-No pain         Complications: No complications documented.

## 2020-09-25 ENCOUNTER — Encounter: Payer: Self-pay | Admitting: Obstetrics & Gynecology

## 2020-09-25 NOTE — Anesthesia Postprocedure Evaluation (Signed)
Anesthesia Post Note  Patient: Adrienne Garcia  Procedure(s) Performed: HYSTERECTOMY VAGINAL BILATERAL SALPINGO-OOPHERECTOMY (Bilateral ) ANTERIOR REPAIR (CYSTOCELE) (N/A )  Patient location during evaluation: PACU Anesthesia Type: General Level of consciousness: awake and alert and oriented Pain management: pain level controlled Vital Signs Assessment: post-procedure vital signs reviewed and stable Respiratory status: spontaneous breathing Cardiovascular status: blood pressure returned to baseline Anesthetic complications: no   No complications documented.   Last Vitals:  Vitals:   09/24/20 1726 09/24/20 1803  BP: 134/73 (!) 143/70  Pulse: (!) 54 67  Resp: 16   Temp:    SpO2: 92% 96%    Last Pain:  Vitals:   09/25/20 0820  TempSrc:   PainSc: 7                  Briani Maul

## 2020-09-26 LAB — SURGICAL PATHOLOGY

## 2020-09-30 DIAGNOSIS — Z419 Encounter for procedure for purposes other than remedying health state, unspecified: Secondary | ICD-10-CM | POA: Diagnosis not present

## 2020-10-02 ENCOUNTER — Other Ambulatory Visit: Payer: Self-pay

## 2020-10-02 ENCOUNTER — Ambulatory Visit (INDEPENDENT_AMBULATORY_CARE_PROVIDER_SITE_OTHER): Payer: Medicaid Other | Admitting: Obstetrics & Gynecology

## 2020-10-02 ENCOUNTER — Encounter: Payer: Self-pay | Admitting: Obstetrics & Gynecology

## 2020-10-02 VITALS — BP 120/80 | Ht 66.0 in | Wt 212.0 lb

## 2020-10-02 DIAGNOSIS — Z90710 Acquired absence of both cervix and uterus: Secondary | ICD-10-CM

## 2020-10-02 NOTE — Progress Notes (Signed)
  Postoperative Follow-up Patient presents post op from vaginal hysterectomy w BSO and anterior colporrhaphy for pelvic relaxation and dyspareunia, 1 week ago. Path: DIAGNOSIS:  A. UTERUS WITH CERVIX, BILATERAL FALLOPIAN TUBES AND OVARIES; TOTAL  HYSTERECTOMY WITH BILATERAL SALPINGO-OOPHORECTOMY:  - UTERINE CERVIX:    - BENIGN TRANSFORMATION ZONE.    - TUNNEL CLUSTERS.    - NEGATIVE FOR SQUAMOUS INTRAEPITHELIAL LESION AND MALIGNANCY.  - ENDOMETRIUM:    - WEAKLY PROLIFERATIVE ENDOMETRIUM.    - NEGATIVE FOR ATYPICAL HYPERPLASIA/EIN AND MALIGNANCY.  - MYOMETRIUM:    - ADENOMYOSIS.    - LEIOMYOMATA UTERI.    - NEGATIVE FOR FEATURES OF MALIGNANCY.  - FALLOPIAN TUBES:    - NO SIGNIFICANT HISTOPATHOLOGIC CHANGE.  - OVARIES:    - BENIGN PHYSIOLOGIC CHANGES.  Subjective: Patient reports marked improvement in her preop symptoms, including better bladder control and less frequency; as well as improved hot flashes. Eating a regular diet without difficulty. The patient is not having any pain.  Activity: normal activities of daily living. Patient reports additional symptom's since surgery of None.  Objective: BP 120/80   Ht 5\' 6"  (1.676 m)   Wt 212 lb (96.2 kg)   LMP 12/16/2011   BMI 34.22 kg/m  Physical Exam Constitutional:      General: She is not in acute distress.    Appearance: She is well-developed.  Cardiovascular:     Rate and Rhythm: Normal rate.  Pulmonary:     Effort: Pulmonary effort is normal.  Abdominal:     General: There is no distension.     Palpations: Abdomen is soft.     Tenderness: There is no abdominal tenderness.  Musculoskeletal:        General: Normal range of motion.  Neurological:     Mental Status: She is alert and oriented to person, place, and time.     Cranial Nerves: No cranial nerve deficit.  Skin:    General: Skin is warm and dry.    Assessment: s/p :  vaginal hysterectomy, bilateral oophorectomy and anterior  colporrhaphy stable  Plan: Patient has done well after surgery with no apparent complications.  I have discussed the post-operative course to date, and the expected progress moving forward.  The patient understands what complications to be concerned about.  I will see the patient in routine follow up, or sooner if needed.    Activity plan: No heavy lifting. Pelvic rest.  Hoyt Koch 10/02/2020, 8:45 AM

## 2020-10-18 MED FILL — PANTOPRAZOLE SOD DR 40 MG T: 40 | 30 days supply | Qty: 60 | Fill #2

## 2020-10-22 ENCOUNTER — Encounter: Payer: Medicaid Other | Admitting: Gastroenterology

## 2020-10-30 DIAGNOSIS — Z419 Encounter for procedure for purposes other than remedying health state, unspecified: Secondary | ICD-10-CM | POA: Diagnosis not present

## 2020-11-13 ENCOUNTER — Other Ambulatory Visit: Payer: Self-pay

## 2020-11-13 ENCOUNTER — Encounter: Payer: Self-pay | Admitting: Obstetrics & Gynecology

## 2020-11-13 ENCOUNTER — Ambulatory Visit (INDEPENDENT_AMBULATORY_CARE_PROVIDER_SITE_OTHER): Payer: Medicaid Other | Admitting: Obstetrics & Gynecology

## 2020-11-13 VITALS — BP 100/60 | Ht 67.0 in | Wt 221.0 lb

## 2020-11-13 DIAGNOSIS — Z90710 Acquired absence of both cervix and uterus: Secondary | ICD-10-CM

## 2020-11-13 NOTE — Progress Notes (Signed)
  Postoperative Follow-up Patient presents post op from [place] for pelvic relaxation, 6 weeks ago.  Subjective: Patient reports marked improvement in her preop symptoms. Eating a regular diet without difficulty. The patient is not having any pain.  Activity: normal activities of daily living. Patient reports additional symptom's since surgery of No bleeding, pressure, d/c, incontinence.  No longer has to wear pads.  Objective: BP 100/60   Ht 5\' 7"  (1.702 m)   Wt 221 lb (100.2 kg)   LMP 12/16/2011   BMI 34.61 kg/m  Physical Exam Constitutional:      General: She is not in acute distress.    Appearance: She is well-developed and well-nourished.  Genitourinary:     Vagina and rectum normal.     There is no rash, tenderness or lesion on the right labia.     There is no rash, tenderness or lesion on the left labia.    Genitourinary Comments: Cervix and uterus absent. Vaginal cuff healing well.     No vaginal erythema or bleeding.      Right Adnexa: not tender and no mass present.    Left Adnexa: not tender and no mass present.    Pelvic exam was performed with patient supine.  Cardiovascular:     Rate and Rhythm: Normal rate.  Pulmonary:     Effort: Pulmonary effort is normal.  Abdominal:     General: There is no distension.     Palpations: Abdomen is soft.     Tenderness: There is no abdominal tenderness.  Musculoskeletal:        General: Normal range of motion.  Neurological:     Mental Status: She is alert and oriented to person, place, and time.     Cranial Nerves: No cranial nerve deficit.  Skin:    General: Skin is warm and dry.  Psychiatric:        Mood and Affect: Mood and affect normal.     Assessment: s/p :  vaginal hysterectomy progressing well  Plan: Patient has done well after surgery with no apparent complications.  I have discussed the post-operative course to date, and the expected progress moving forward.  The patient understands what complications  to be concerned about.  I will see the patient in routine follow up, or sooner if needed.    Activity plan: No restriction.  Hoyt Koch 11/13/2020, 8:42 AM

## 2020-11-19 MED FILL — PANTOPRAZOLE SOD DR 40 MG T: 40 | 30 days supply | Qty: 60 | Fill #3

## 2020-11-26 ENCOUNTER — Other Ambulatory Visit: Payer: Self-pay

## 2020-11-26 ENCOUNTER — Ambulatory Visit (AMBULATORY_SURGERY_CENTER): Payer: Self-pay

## 2020-11-26 VITALS — Ht 66.0 in | Wt 221.2 lb

## 2020-11-26 DIAGNOSIS — Z1211 Encounter for screening for malignant neoplasm of colon: Secondary | ICD-10-CM

## 2020-11-26 MED ORDER — NA SULFATE-K SULFATE-MG SULF 17.5-3.13-1.6 GM/177ML PO SOLN: 1.0000 | Freq: Once | ORAL | 0 refills | Status: AC

## 2020-11-26 MED FILL — SUPREP BOWEL PREP KIT: 17.5-3.13-1 | 1 days supply | Qty: 354 | Fill #0

## 2020-11-26 NOTE — Progress Notes (Signed)

## 2020-11-30 DIAGNOSIS — Z419 Encounter for procedure for purposes other than remedying health state, unspecified: Secondary | ICD-10-CM | POA: Diagnosis not present

## 2020-12-12 ENCOUNTER — Encounter: Payer: Medicaid Other | Admitting: Gastroenterology

## 2020-12-20 ENCOUNTER — Other Ambulatory Visit: Payer: Self-pay | Admitting: Family Medicine

## 2020-12-20 MED FILL — PANTOPRAZOLE SOD DR 40 MG T: 40 | 30 days supply | Qty: 30 | Fill #0

## 2020-12-20 MED FILL — PROMETHAZINE HCL 25 MG TABS: 25 | 7 days supply | Qty: 30 | Fill #0

## 2020-12-31 DIAGNOSIS — Z419 Encounter for procedure for purposes other than remedying health state, unspecified: Secondary | ICD-10-CM | POA: Diagnosis not present

## 2021-01-22 MED FILL — PANTOPRAZOLE SOD DR 40 MG T: 40 | 30 days supply | Qty: 30 | Fill #1

## 2021-01-28 DIAGNOSIS — Z419 Encounter for procedure for purposes other than remedying health state, unspecified: Secondary | ICD-10-CM | POA: Diagnosis not present

## 2021-02-25 MED FILL — PANTOPRAZOLE SOD DR 40 MG T: 40 | 30 days supply | Qty: 30 | Fill #2

## 2021-02-28 DIAGNOSIS — Z419 Encounter for procedure for purposes other than remedying health state, unspecified: Secondary | ICD-10-CM | POA: Diagnosis not present

## 2021-03-26 ENCOUNTER — Other Ambulatory Visit: Payer: Self-pay | Admitting: Family Medicine

## 2021-03-26 ENCOUNTER — Other Ambulatory Visit: Payer: Self-pay

## 2021-03-26 MED FILL — Pantoprazole Sodium EC Tab 40 MG (Base Equiv): ORAL | 30 days supply | Qty: 30 | Fill #0 | Status: AC

## 2021-03-27 ENCOUNTER — Other Ambulatory Visit: Payer: Self-pay

## 2021-03-28 ENCOUNTER — Other Ambulatory Visit: Payer: Self-pay

## 2021-03-30 DIAGNOSIS — Z419 Encounter for procedure for purposes other than remedying health state, unspecified: Secondary | ICD-10-CM | POA: Diagnosis not present

## 2021-04-30 ENCOUNTER — Other Ambulatory Visit: Payer: Self-pay

## 2021-04-30 DIAGNOSIS — Z419 Encounter for procedure for purposes other than remedying health state, unspecified: Secondary | ICD-10-CM | POA: Diagnosis not present

## 2021-04-30 MED FILL — Pantoprazole Sodium EC Tab 40 MG (Base Equiv): ORAL | 30 days supply | Qty: 30 | Fill #1 | Status: AC

## 2021-05-01 ENCOUNTER — Other Ambulatory Visit: Payer: Self-pay

## 2021-05-23 IMAGING — DX DG ABDOMEN ACUTE W/ 1V CHEST
3 series · 3 of 3 positions shown · non-contrast
Comparison: 09/19/2015.

CLINICAL DATA: PT has known hiatal hernia. She has been vomiting
for a few days and the pain has gotten severe. no sob.

EXAM:
DG ABDOMEN ACUTE W/ 1V CHEST

[chest pa]
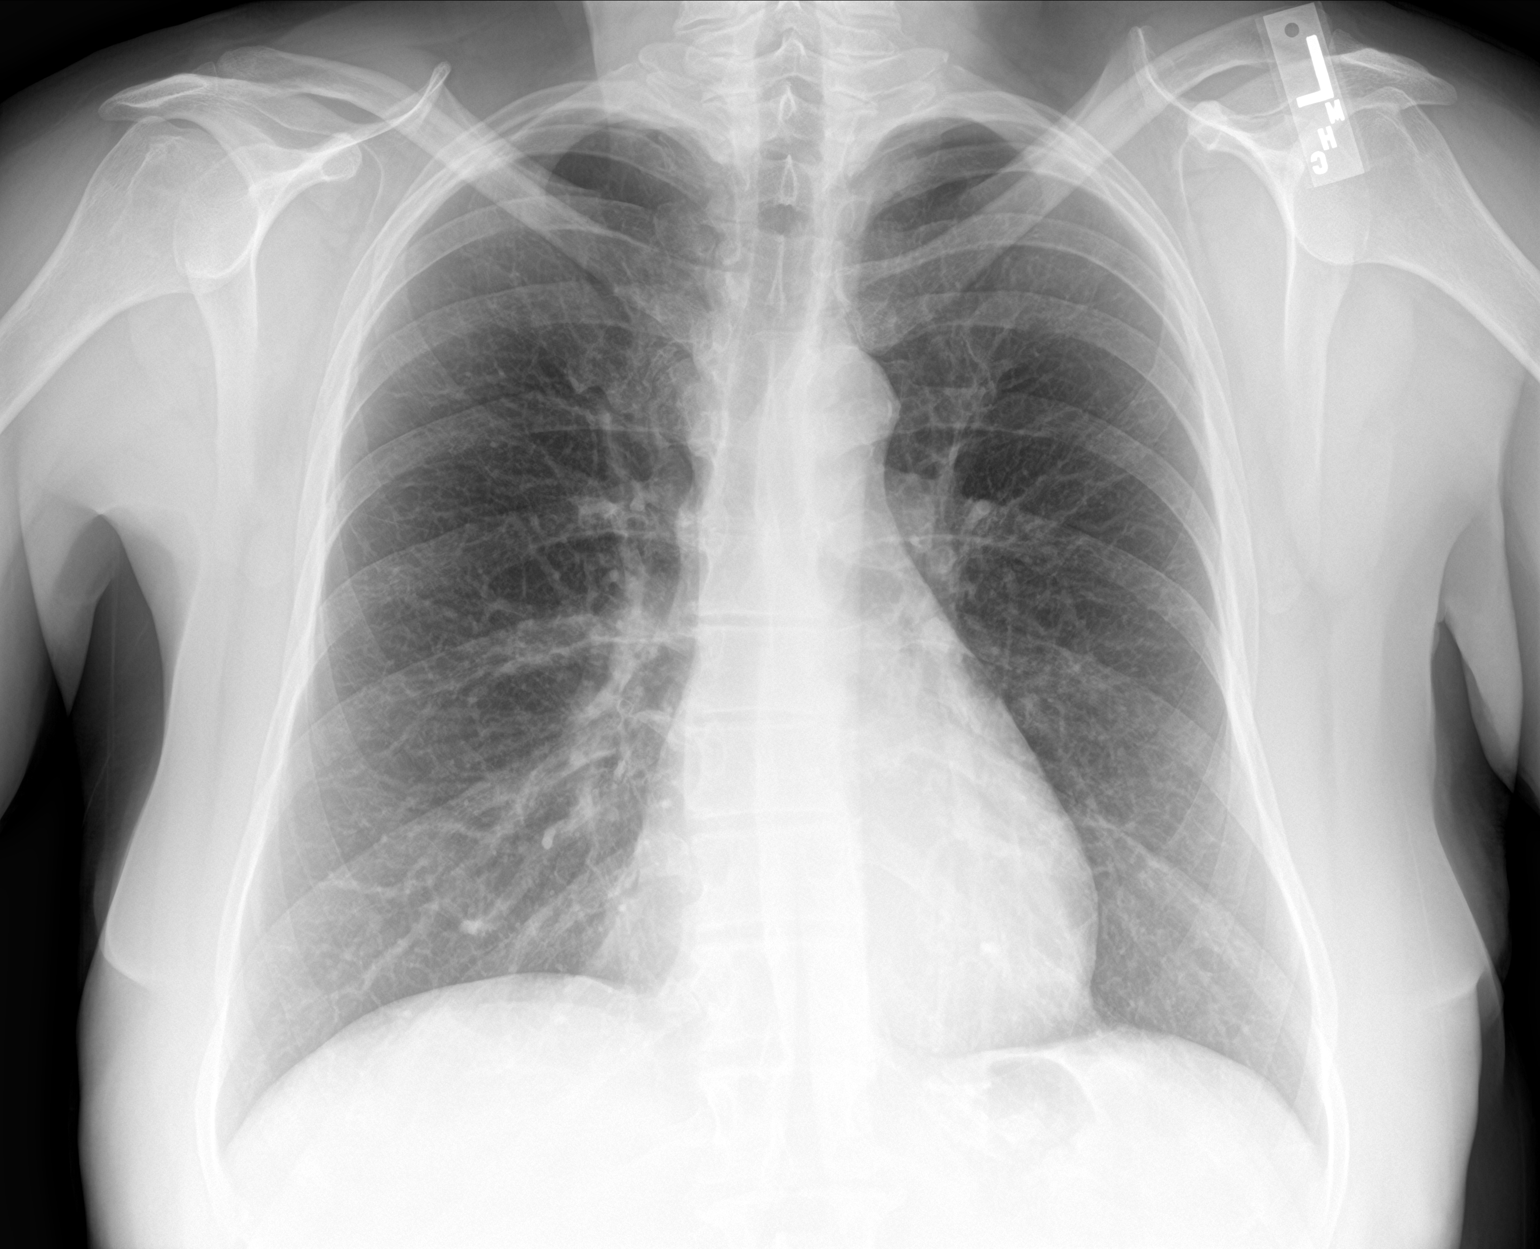

[abdomen erect]
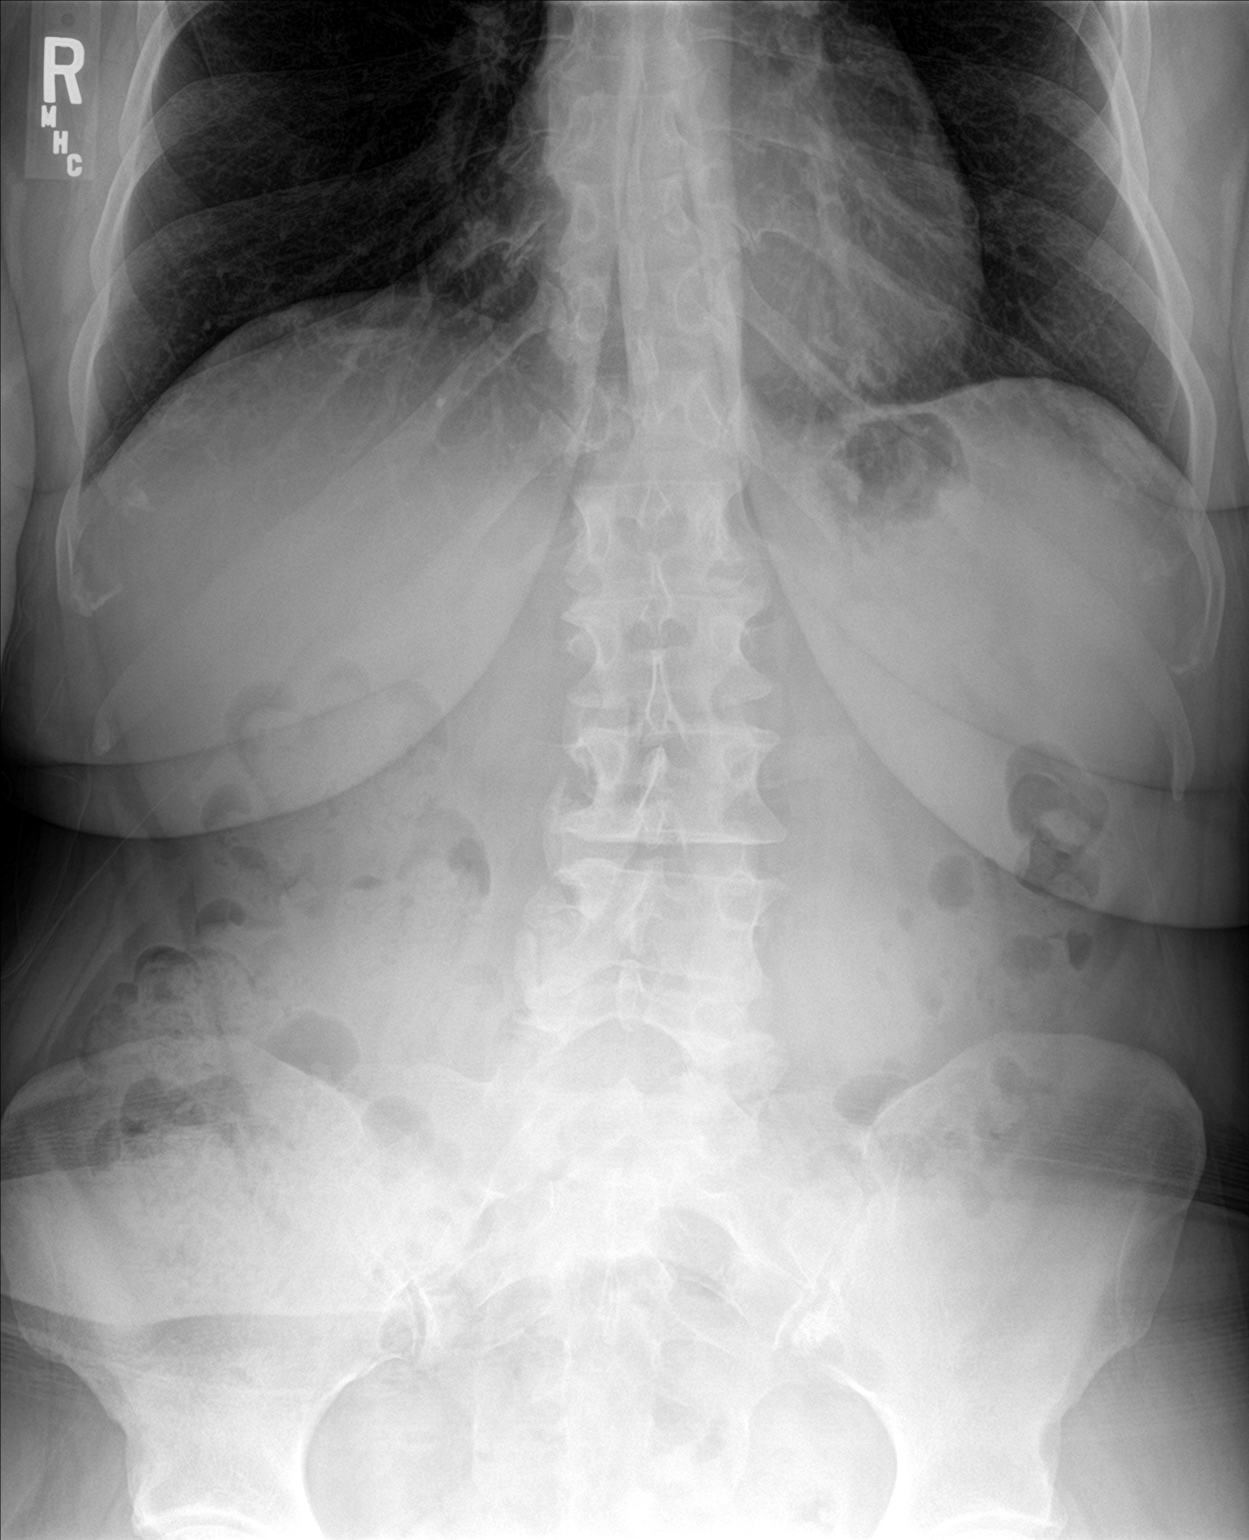

[abdomen supine]
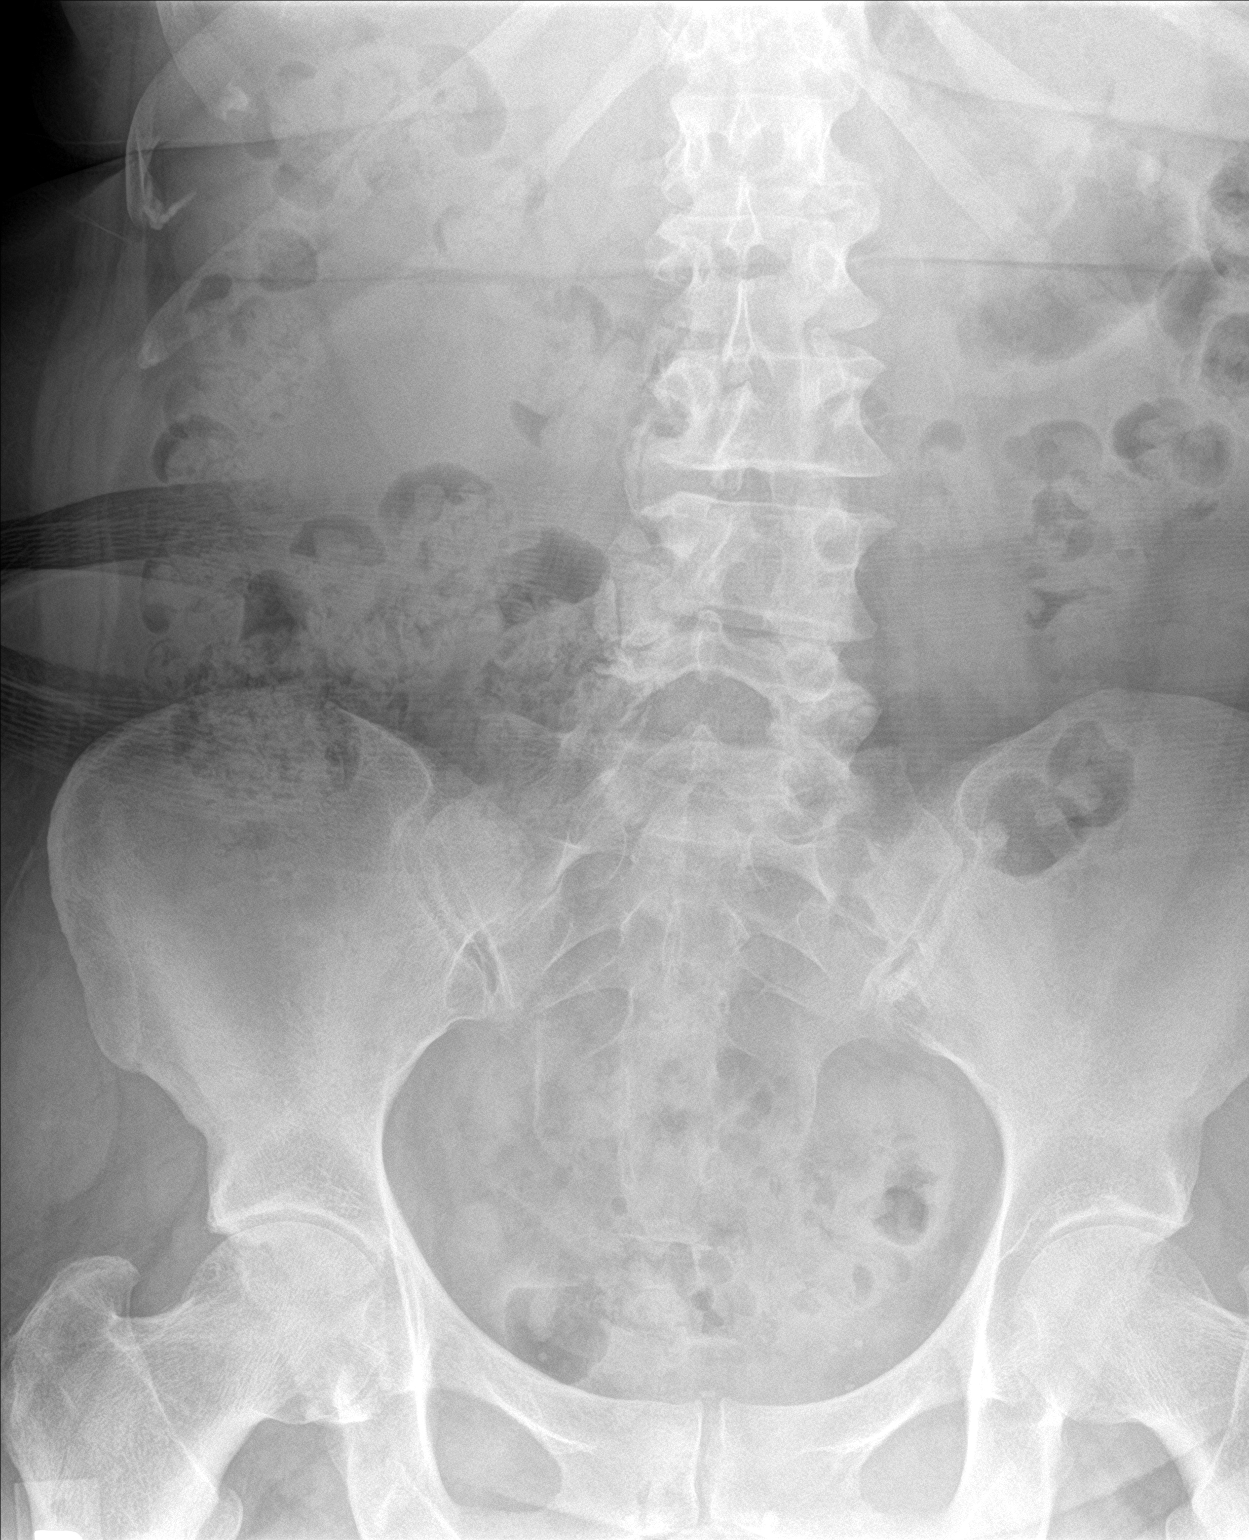

[3 of 3 positions shown; findings below may reference images not displayed]

FINDINGS: Normal bowel gas pattern. Mild generalized increase in the colonic
stool burden. No free air.

No evidence of renal or ureteral stones. Soft tissues are
unremarkable.

Normal heart, mediastinum and hila.

Lungs are hyperexpanded, but clear.

No acute skeletal abnormality.
IMPRESSION: 1. No acute findings.  No evidence of bowel obstruction or free air.
2. Mild generalized increase in the colonic stool burden.
3. No active cardiopulmonary disease.

## 2021-05-30 ENCOUNTER — Other Ambulatory Visit: Payer: Self-pay

## 2021-05-30 DIAGNOSIS — Z419 Encounter for procedure for purposes other than remedying health state, unspecified: Secondary | ICD-10-CM | POA: Diagnosis not present

## 2021-05-30 MED FILL — Pantoprazole Sodium EC Tab 40 MG (Base Equiv): ORAL | 30 days supply | Qty: 30 | Fill #2 | Status: AC

## 2021-06-05 ENCOUNTER — Other Ambulatory Visit: Payer: Self-pay

## 2021-06-30 DIAGNOSIS — Z419 Encounter for procedure for purposes other than remedying health state, unspecified: Secondary | ICD-10-CM | POA: Diagnosis not present

## 2021-07-08 ENCOUNTER — Other Ambulatory Visit: Payer: Self-pay

## 2021-07-08 ENCOUNTER — Encounter (HOSPITAL_COMMUNITY): Payer: Self-pay | Admitting: *Deleted

## 2021-07-08 ENCOUNTER — Emergency Department (HOSPITAL_COMMUNITY): Payer: Medicaid Other

## 2021-07-08 ENCOUNTER — Emergency Department (HOSPITAL_COMMUNITY)
Admission: EM | Admit: 2021-07-08 | Discharge: 2021-07-09 | Disposition: A | Discharge status: Home / Self Care | Payer: Medicaid Other | Attending: Emergency Medicine | Admitting: Emergency Medicine

## 2021-07-08 DIAGNOSIS — J45909 Unspecified asthma, uncomplicated: Secondary | ICD-10-CM | POA: Insufficient documentation

## 2021-07-08 DIAGNOSIS — U071 COVID-19: Secondary | ICD-10-CM | POA: Diagnosis not present

## 2021-07-08 DIAGNOSIS — J449 Chronic obstructive pulmonary disease, unspecified: Secondary | ICD-10-CM | POA: Diagnosis not present

## 2021-07-08 DIAGNOSIS — Z87891 Personal history of nicotine dependence: Secondary | ICD-10-CM | POA: Insufficient documentation

## 2021-07-08 DIAGNOSIS — R059 Cough, unspecified: Secondary | ICD-10-CM | POA: Diagnosis present

## 2021-07-08 DIAGNOSIS — R0602 Shortness of breath: Secondary | ICD-10-CM | POA: Diagnosis not present

## 2021-07-08 DIAGNOSIS — Z85828 Personal history of other malignant neoplasm of skin: Secondary | ICD-10-CM | POA: Insufficient documentation

## 2021-07-08 LAB — COMPREHENSIVE METABOLIC PANEL
ALT: 22 U/L (ref 0–44)
AST: 23 U/L (ref 15–41)
Albumin: 4.3 g/dL (ref 3.5–5.0)
Alkaline Phosphatase: 95 U/L (ref 38–126)
Anion gap: 13 (ref 5–15)
BUN: 15 mg/dL (ref 6–20)
CO2: 27 mmol/L (ref 22–32)
Calcium: 9.7 mg/dL (ref 8.9–10.3)
Chloride: 96 mmol/L — ABNORMAL LOW (ref 98–111)
Creatinine, Ser: 0.76 mg/dL (ref 0.44–1.00)
GFR, Estimated: >60 mL/min (ref >60)
Glucose, Bld: 129 mg/dL — ABNORMAL HIGH (ref 70–99)
Potassium: 3.3 mmol/L — ABNORMAL LOW (ref 3.5–5.1)
Sodium: 136 mmol/L (ref 135–145)
Total Bilirubin: 0.4 mg/dL (ref 0.3–1.2)
Total Protein: 7.5 g/dL (ref 6.5–8.1)

## 2021-07-08 LAB — CBC WITH DIFFERENTIAL/PLATELET
Abs Immature Granulocytes: 0.06 10*3/uL (ref 0.00–0.07)
Basophils Absolute: 0 10*3/uL (ref 0.0–0.1)
Basophils Relative: 0 %
Eosinophils Absolute: 0 10*3/uL (ref 0.0–0.5)
Eosinophils Relative: 0 %
HCT: 43.5 % (ref 36.0–46.0)
Hemoglobin: 14.7 g/dL (ref 12.0–15.0)
Immature Granulocytes: 1 %
Lymphocytes Relative: 11 %
Lymphs Abs: 1 10*3/uL (ref 0.7–4.0)
MCH: 31.3 pg (ref 26.0–34.0)
MCHC: 33.8 g/dL (ref 30.0–36.0)
MCV: 92.6 fL (ref 80.0–100.0)
Monocytes Absolute: 0.8 10*3/uL (ref 0.1–1.0)
Monocytes Relative: 9 %
Neutro Abs: 7.1 10*3/uL (ref 1.7–7.7)
Neutrophils Relative %: 79 %
Platelets: 180 10*3/uL (ref 150–400)
RBC: 4.7 MIL/uL (ref 3.87–5.11)
RDW: 13.2 % (ref 11.5–15.5)
WBC: 9 10*3/uL (ref 4.0–10.5)
nRBC: 0 % (ref 0.0–0.2)

## 2021-07-08 LAB — LIPASE, BLOOD: Lipase: 41 U/L (ref 11–51)

## 2021-07-08 MED ORDER — POTASSIUM CHLORIDE CRYS ER 20 MEQ PO TBCR
20.0000 meq | EXTENDED_RELEASE_TABLET | Freq: Once | ORAL | Status: AC
  Administered 2021-07-08: 20 meq via ORAL
  Filled 2021-07-08: qty 1

## 2021-07-08 MED ORDER — NIRMATRELVIR/RITONAVIR (PAXLOVID)TABLET: 3.0000 | ORAL_TABLET | Freq: Two times a day (BID) | ORAL | 0 refills | Status: AC

## 2021-07-08 MED ORDER — PROCHLORPERAZINE MALEATE 10 MG PO TABS: 10.0000 mg | ORAL_TABLET | Freq: Four times a day (QID) | ORAL | 0 refills | Status: DC | PRN

## 2021-07-08 MED ORDER — PROCHLORPERAZINE EDISYLATE 10 MG/2ML IJ SOLN
10.0000 mg | Freq: Once | INTRAMUSCULAR | Status: AC
  Administered 2021-07-08: 10 mg via INTRAVENOUS
  Filled 2021-07-08: qty 2

## 2021-07-08 MED ORDER — SODIUM CHLORIDE 0.9 % IV BOLUS
1000.0000 mL | Freq: Once | INTRAVENOUS | Status: AC
  Administered 2021-07-08: 1000 mL via INTRAVENOUS

## 2021-07-08 NOTE — ED Provider Notes (Signed)
[facility] Provider Triage Evaluation Note  Adrienne Garcia , a 55 y.o. female  was evaluated in triage.  Pt complains of body aches, nausea, vomiting, diarrhea, shortness of breath in context of positive COVID test 3 days ago.  Patient was vaccinated.  Review of Systems  Positive: Shortness of breath, nausea, vomiting, fevers, chills, myalgias Negative: syncope  Physical Exam  BP (!) 153/122 (BP Location: Left Arm)   Pulse 63   Temp 98.8 F (37.1 C) (Oral)   Resp 18   LMP 12/16/2011   SpO2 96%  Gen:   Awake, no distress   Resp:  Normal effort  MSK:   Moves extremities without difficulty  Other:  RRR no M/R/G.  Lungs with prolonged expiratory phase, context of COPD.  Abdomen soft, nondistended, nontender.  Medical Decision Making  Medically screening exam initiated at 3:40 PM.  Appropriate orders placed.  Portland Glanzman was informed that the remainder of the evaluation will be completed by another provider, this initial triage assessment does not replace that evaluation, and the importance of remaining in the ED until their evaluation is complete.  This chart was dictated using voice recognition software, Dragon. Despite the best efforts of this provider to proofread and correct errors, errors may still occur which can change documentation meaning.    Emeline Darling, PA-C [phone]    Lianne Cure, DO [phone]

## 2021-07-08 NOTE — Discharge Instructions (Signed)
Tylenol every 4 hours for fever.  Paxlovid as directed

## 2021-07-08 NOTE — ED Provider Notes (Signed)
[facility] DEPT Provider Note   CSN: [id number] Arrival date & time: 07/08/21  1434     History Chief Complaint  Patient presents with   Dehydration   Covid Positive    Adrienne Garcia is a 55 y.o. female.  The history is provided by the patient. No language interpreter was used.  Cough Cough characteristics:  Productive Sputum characteristics:  Nondescript Severity:  Severe Onset quality:  Gradual Duration:  3 days Timing:  Constant Progression:  Worsening Relieved by:  Nothing Worsened by:  Nothing Ineffective treatments:  None tried Associated symptoms: shortness of breath   Risk factors: recent infection   Pt reports she tested positive for covid 3 days ago    Past Medical History:  Diagnosis Date   Anemia    Anxiety    Arthritis    knees   Asthma    Basal cell carcinoma of neck    "front of my neck; it was a melanoma"   Bipolar disorder ([place])    COPD (chronic obstructive pulmonary disease) (HCC)    Daily headache    Depression    Family history of adverse reaction to anesthesia    MOM-HARD TO WAKE UP   Fibromyalgia    GERD (gastroesophageal reflux disease)    Heart murmur    H/O   History of hiatal hernia    SMALL   Intractable nausea and vomiting    Migraine    "probably twice/month" (09/19/2015)   Obesity    PTSD (post-traumatic stress disorder)    Thyroid disease     Patient Active Problem List   Diagnosis Date Noted   S/P vaginal hysterectomy 10/02/2020   Uterine prolapse 09/04/2020   Dyspareunia due to medical condition in female 09/04/2020   Urinary retention 09/04/2020   Influenza B 01/29/2016   Influenza 01/29/2016   Status asthmaticus    Bipolar affective disorder, currently manic, moderate (HCC)    Acute bronchitis with asthma with acute exacerbation 01/26/2016   Acute bronchitis with bronchospasm 01/26/2016   Bronchitis with bronchospasm    Costochondritis, acute    Intractable nausea and vomiting  09/19/2015   Hyperglycemia 09/19/2015   Leukocytosis 09/19/2015   Thrombocytopenia ([place]) 09/19/2015   Diarrhea 09/19/2015   Obesity 09/19/2015   Thyroid disease    Depression     Past Surgical History:  Procedure Laterality Date   CYSTOCELE REPAIR N/A 09/24/2020   Procedure: ANTERIOR REPAIR (CYSTOCELE);  Surgeon: Gae Dry, MD;  Location: [facility] ORS;  Service: Gynecology;  Laterality: N/A;   MOLE REMOVAL  ~ 2013   "front side of my neck"   MULTIPLE TOOTH EXTRACTIONS  ~ 2011   VAGINAL HYSTERECTOMY Bilateral 09/24/2020   Procedure: HYSTERECTOMY VAGINAL BILATERAL SALPINGO-OOPHERECTOMY;  Surgeon: Gae Dry, MD;  Location: [facility] ORS;  Service: Gynecology;  Laterality: Bilateral;     OB History     Gravida  2   Para  2   Term  2   Preterm      AB      Living  2      SAB      IAB      Ectopic      Multiple      Live Births              Family History  Problem Relation Age of Onset   CAD Mother    Hypertension Other        sibling   Diabetes Other  sibling   Breast cancer Sister    Colon cancer Neg Hx    Colon polyps Neg Hx    Esophageal cancer Neg Hx    Rectal cancer Neg Hx    Stomach cancer Neg Hx     Social History   Tobacco Use   Smoking status: Former    Packs/day: 0.50    Years: 28.00    Pack years: 14.00    Types: Cigarettes    Quit date: 09/17/2013    Years since quitting: 7.8   Smokeless tobacco: Never   Tobacco comments:    "quit smoking ~ 2014"  Vaping Use   Vaping Use: Never used  Substance Use Topics   Alcohol use: No   Drug use: Not Currently    Types: Marijuana    Comment: 09/19/2015 "probably twice/yr":    Home Medications Prior to Admission medications   Medication Sig Start Date End Date Taking? Authorizing Provider  nirmatrelvir/ritonavir EUA (PAXLOVID) TABS Take 3 tablets by mouth 2 (two) times daily for 5 days. Patient GFR is great than 60 Take nirmatrelvir (150 mg) two tablets twice daily for 5  days and ritonavir (100 mg) one tablet twice daily for 5 days. 07/08/21 07/13/21 Yes Fransico Meadow, PA-C  prochlorperazine (COMPAZINE) 10 MG tablet Take 1 tablet (10 mg total) by mouth every 6 (six) hours as needed for nausea or vomiting. 07/08/21  Yes Caryl Ada K, PA-C  pantoprazole (PROTONIX) 40 MG tablet Take 1 tablet (40 mg total) by mouth as needed. 12/20/20   Susy Frizzle, MD  pantoprazole (PROTONIX) 40 MG tablet TAKE 1 TABLET (40 MG TOTAL) BY MOUTH AS NEEDED. 12/20/20 12/20/21  Susy Frizzle, MD  pantoprazole (PROTONIX) 40 MG tablet TAKE 1 TABLET (40 MG TOTAL) BY MOUTH 2 (TWO) TIMES DAILY. 08/08/20 08/08/21  Susy Frizzle, MD  promethazine (PHENERGAN) 25 MG tablet TAKE 1 TABLET (25 MG TOTAL) BY MOUTH EVERY 6 (SIX) HOURS AS NEEDED FOR NAUSEA OR VOMITING. 12/20/20   Susy Frizzle, MD  promethazine (PHENERGAN) 25 MG tablet TAKE 1 TABLET (25 MG TOTAL) BY MOUTH EVERY 6 (SIX) HOURS AS NEEDED FOR NAUSEA OR VOMITING. 12/20/20 12/20/21  Susy Frizzle, MD  potassium chloride (K-DUR) 10 MEQ tablet Take 1 tablet (10 mEq total) by mouth daily. 07/07/17 03/27/20  Shary Decamp, PA-C    Allergies    Ciprofloxacin  Review of Systems   Review of Systems  Respiratory:  Positive for cough and shortness of breath.   All other systems reviewed and are negative.  Physical Exam Updated Vital Signs BP 139/77   Pulse (!) 57   Temp 98.8 F (37.1 C) (Oral)   Resp 18   LMP 12/16/2011   SpO2 96%   Physical Exam Vitals and nursing note reviewed.  Constitutional:      Appearance: She is well-developed.  HENT:     Head: Normocephalic.  Cardiovascular:     Rate and Rhythm: Normal rate and regular rhythm.  Pulmonary:     Effort: Pulmonary effort is normal.  Abdominal:     General: There is no distension.  Musculoskeletal:        General: Normal range of motion.     Cervical back: Normal range of motion.  Skin:    General: Skin is warm.  Neurological:     General: No focal deficit present.      Mental Status: She is alert and oriented to person, place, and time.  Psychiatric:  Mood and Affect: Mood normal.    ED Results / Procedures / Treatments   Labs (all labs ordered are listed, but only abnormal results are displayed) Labs Reviewed  COMPREHENSIVE METABOLIC PANEL - Abnormal; Notable for the following components:      Result Value   Potassium 3.3 (*)    Chloride 96 (*)    Glucose, Bld 129 (*)    All other components within normal limits  CBC WITH DIFFERENTIAL/PLATELET  LIPASE, BLOOD  URINALYSIS, ROUTINE W REFLEX MICROSCOPIC    EKG EKG Interpretation  Date/Time:  Tuesday July 08 2021 19:32:30 EDT Ventricular Rate:  61 PR Interval:  124 QRS Duration: 113 QT Interval:  467 QTC Calculation: 471 R Axis:   64 Text Interpretation: Sinus rhythm Borderline intraventricular conduction delay ST elev, probable normal early repol pattern No significant change since prior 8/18 Confirmed by Aletta Edouard [phone]) on 07/08/2021 7:38:53 PM  Radiology DG Chest 2 View  Result Date: 07/08/2021 CLINICAL DATA:  Shortness of breath EXAM: CHEST - 2 VIEW COMPARISON:  04/28/2020, 07/07/2017 FINDINGS: The heart size and mediastinal contours are within normal limits. Both lungs are clear. The visualized skeletal structures are unremarkable. IMPRESSION: No active cardiopulmonary disease. Electronically Signed   By: Donavan Foil M.D.   On: 07/08/2021 16:15    Procedures Procedures   Medications Ordered in ED Medications  potassium chloride SA (KLOR-CON) CR tablet 20 mEq (has no administration in time range)  sodium chloride 0.9 % bolus 1,000 mL (has no administration in time range)  prochlorperazine (COMPAZINE) injection 10 mg (has no administration in time range)    ED Course  I have reviewed the triage vital signs and the nursing notes.  Pertinent labs & imaging results that were available during my care of the patient were reviewed by me and considered in my medical  decision making (see chart for details).    MDM Rules/Calculators/A&P                           MDM:  Pt given compazine and IV fluids.  Pt's labs reviewed.  Pt does want to take paxlovid.   Final Clinical Impression(s) / ED Diagnoses Final diagnoses:  COVID    Rx / DC Orders ED Discharge Orders          Ordered    prochlorperazine (COMPAZINE) 10 MG tablet  Every 6 hours PRN        07/08/21 2253    nirmatrelvir/ritonavir EUA (PAXLOVID) TABS  2 times daily        07/08/21 2253             Sidney Ace 07/08/21 2353    Hayden Rasmussen, MD 07/09/21 1154

## 2021-07-08 NOTE — ED Triage Notes (Signed)
Pt complains of body aches, vomiting and diarrhea. Tested positive for COVID 3 days ago. Feels dehydrated.

## 2021-07-08 NOTE — ED Notes (Signed)
Pt vein blew and pt said IV team usually gets her blood

## 2021-07-09 ENCOUNTER — Other Ambulatory Visit: Payer: Self-pay

## 2021-07-09 ENCOUNTER — Other Ambulatory Visit: Payer: Self-pay | Admitting: Family Medicine

## 2021-07-09 LAB — URINALYSIS, ROUTINE W REFLEX MICROSCOPIC
Bilirubin Urine: NEGATIVE
Glucose, UA: NEGATIVE mg/dL
Ketones, ur: NEGATIVE mg/dL
Leukocytes,Ua: NEGATIVE
Nitrite: NEGATIVE
Protein, ur: >=300 mg/dL — AB
Specific Gravity, Urine: 1.028 (ref 1.005–1.030)
pH: 5 (ref 5.0–8.0)

## 2021-07-09 MED ORDER — PANTOPRAZOLE SODIUM 40 MG PO TBEC
DELAYED_RELEASE_TABLET | ORAL | 1 refills | Status: DC
  Filled 2021-07-09: qty 30, 30d supply, fill #0
  Filled 2021-08-08: qty 30, 30d supply, fill #1
  Filled 2021-09-11: qty 30, 30d supply, fill #2
  Filled 2021-10-17: qty 30, 30d supply, fill #3
  Filled 2021-11-17: qty 30, 30d supply, fill #4
  Filled 2021-12-22: qty 30, 30d supply, fill #0

## 2021-07-10 ENCOUNTER — Telehealth: Payer: Self-pay

## 2021-07-10 NOTE — Telephone Encounter (Signed)
Transition Care Management Unsuccessful Follow-up Telephone Call  Date of discharge and from where:  07/09/2021-[facility]   Attempts:  1st Attempt  Reason for unsuccessful TCM follow-up call:  Left voice message

## 2021-07-10 NOTE — Telephone Encounter (Signed)
ERROR

## 2021-07-11 NOTE — Telephone Encounter (Signed)
Transition Care Management Follow-up Telephone Call Date of discharge and from where: 07/09/2021-[facility]  How have you been since you were released from the hospital? Patient stated she is doing better.  Any questions or concerns? No  Items Reviewed: Did the pt receive and understand the discharge instructions provided? Yes  Medications obtained and verified? Yes  Other? No  Any new allergies since your discharge? No  Dietary orders reviewed? N/A Do you have support at home? Yes   Home Care and Equipment/Supplies: Were home health services ordered? not applicable If so, what is the name of the agency? N/A  Has the agency set up a time to come to the patient's home? not applicable Were any new equipment or medical supplies ordered?  No What is the name of the medical supply agency? N/A Were you able to get the supplies/equipment? not applicable Do you have any questions related to the use of the equipment or supplies? No  Functional Questionnaire: (I = Independent and D = Dependent) ADLs: I  Bathing/Dressing- I  Meal Prep- I  Eating- I  Maintaining continence- I  Transferring/Ambulation- I  Managing Meds- I  Follow up appointments reviewed:  PCP Hospital f/u appt confirmed? No   Specialist Hospital f/u appt confirmed? No   Are transportation arrangements needed? No  If their condition worsens, is the pt aware to call PCP or go to the Emergency Dept.? Yes Was the patient provided with contact information for the PCP's office or ED? Yes Was to pt encouraged to call back with questions or concerns? Yes

## 2021-07-31 DIAGNOSIS — Z419 Encounter for procedure for purposes other than remedying health state, unspecified: Secondary | ICD-10-CM | POA: Diagnosis not present

## 2021-08-08 ENCOUNTER — Other Ambulatory Visit: Payer: Self-pay

## 2021-08-30 DIAGNOSIS — Z419 Encounter for procedure for purposes other than remedying health state, unspecified: Secondary | ICD-10-CM | POA: Diagnosis not present

## 2021-09-11 ENCOUNTER — Other Ambulatory Visit: Payer: Self-pay

## 2021-09-15 ENCOUNTER — Other Ambulatory Visit: Payer: Self-pay

## 2021-09-30 DIAGNOSIS — Z419 Encounter for procedure for purposes other than remedying health state, unspecified: Secondary | ICD-10-CM | POA: Diagnosis not present

## 2021-10-17 ENCOUNTER — Other Ambulatory Visit: Payer: Self-pay

## 2021-10-30 DIAGNOSIS — Z419 Encounter for procedure for purposes other than remedying health state, unspecified: Secondary | ICD-10-CM | POA: Diagnosis not present

## 2021-11-17 ENCOUNTER — Other Ambulatory Visit: Payer: Self-pay

## 2021-11-21 ENCOUNTER — Other Ambulatory Visit: Payer: Self-pay

## 2021-11-30 DIAGNOSIS — Z419 Encounter for procedure for purposes other than remedying health state, unspecified: Secondary | ICD-10-CM | POA: Diagnosis not present

## 2021-12-22 ENCOUNTER — Other Ambulatory Visit: Payer: Self-pay

## 2021-12-31 DIAGNOSIS — Z419 Encounter for procedure for purposes other than remedying health state, unspecified: Secondary | ICD-10-CM | POA: Diagnosis not present

## 2022-01-16 ENCOUNTER — Other Ambulatory Visit: Payer: Self-pay

## 2022-01-16 ENCOUNTER — Other Ambulatory Visit: Payer: Self-pay | Admitting: Family Medicine

## 2022-01-19 ENCOUNTER — Other Ambulatory Visit: Payer: Self-pay

## 2022-01-28 DIAGNOSIS — Z419 Encounter for procedure for purposes other than remedying health state, unspecified: Secondary | ICD-10-CM | POA: Diagnosis not present

## 2022-02-28 DIAGNOSIS — Z419 Encounter for procedure for purposes other than remedying health state, unspecified: Secondary | ICD-10-CM | POA: Diagnosis not present

## 2022-03-30 DIAGNOSIS — Z419 Encounter for procedure for purposes other than remedying health state, unspecified: Secondary | ICD-10-CM | POA: Diagnosis not present

## 2022-04-29 IMAGING — CR DG CHEST 2V
2 series · 2 of 2 positions shown · non-contrast
Comparison: 04/28/2020, 07/07/2017

CLINICAL DATA: Shortness of breath

EXAM:
CHEST - 2 VIEW

[w chest lat]
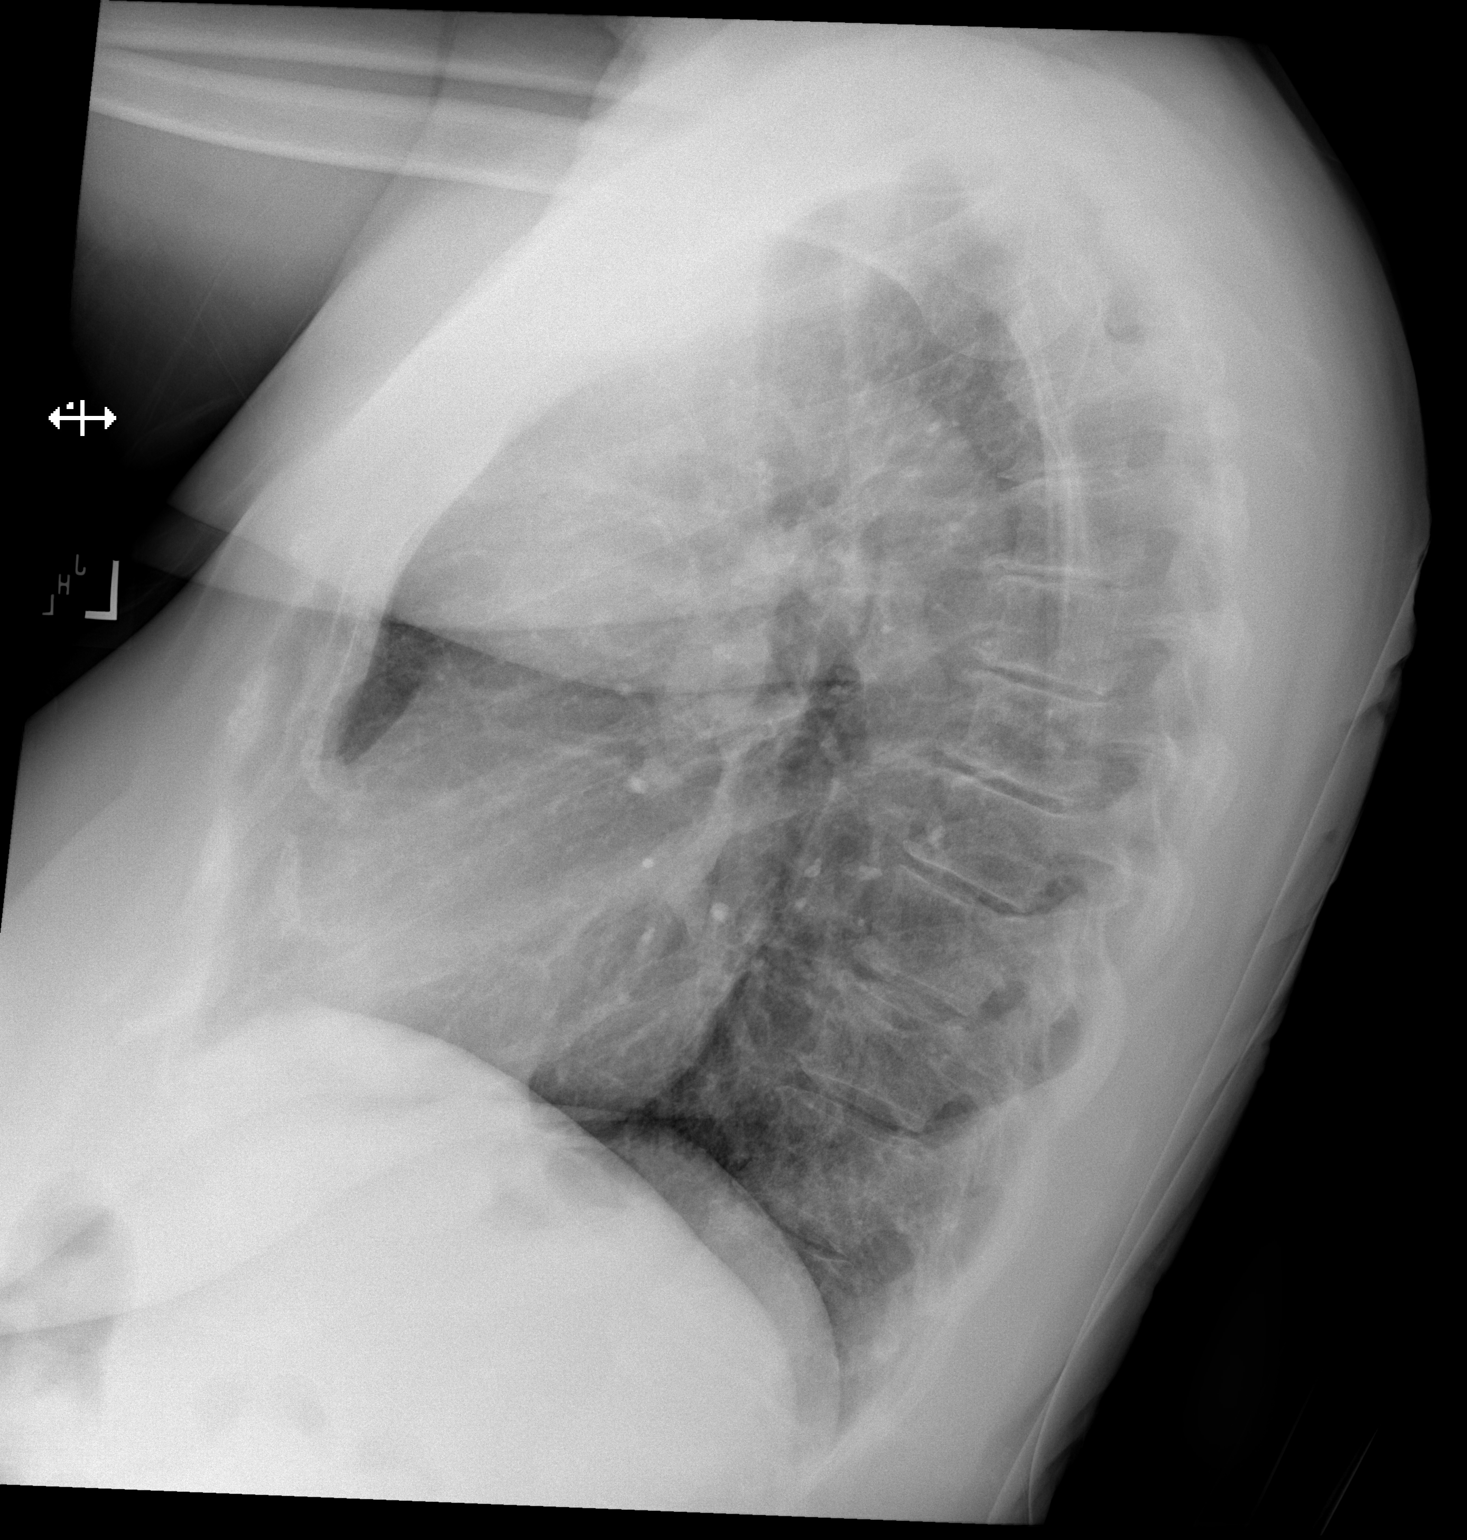

[x chest ap]
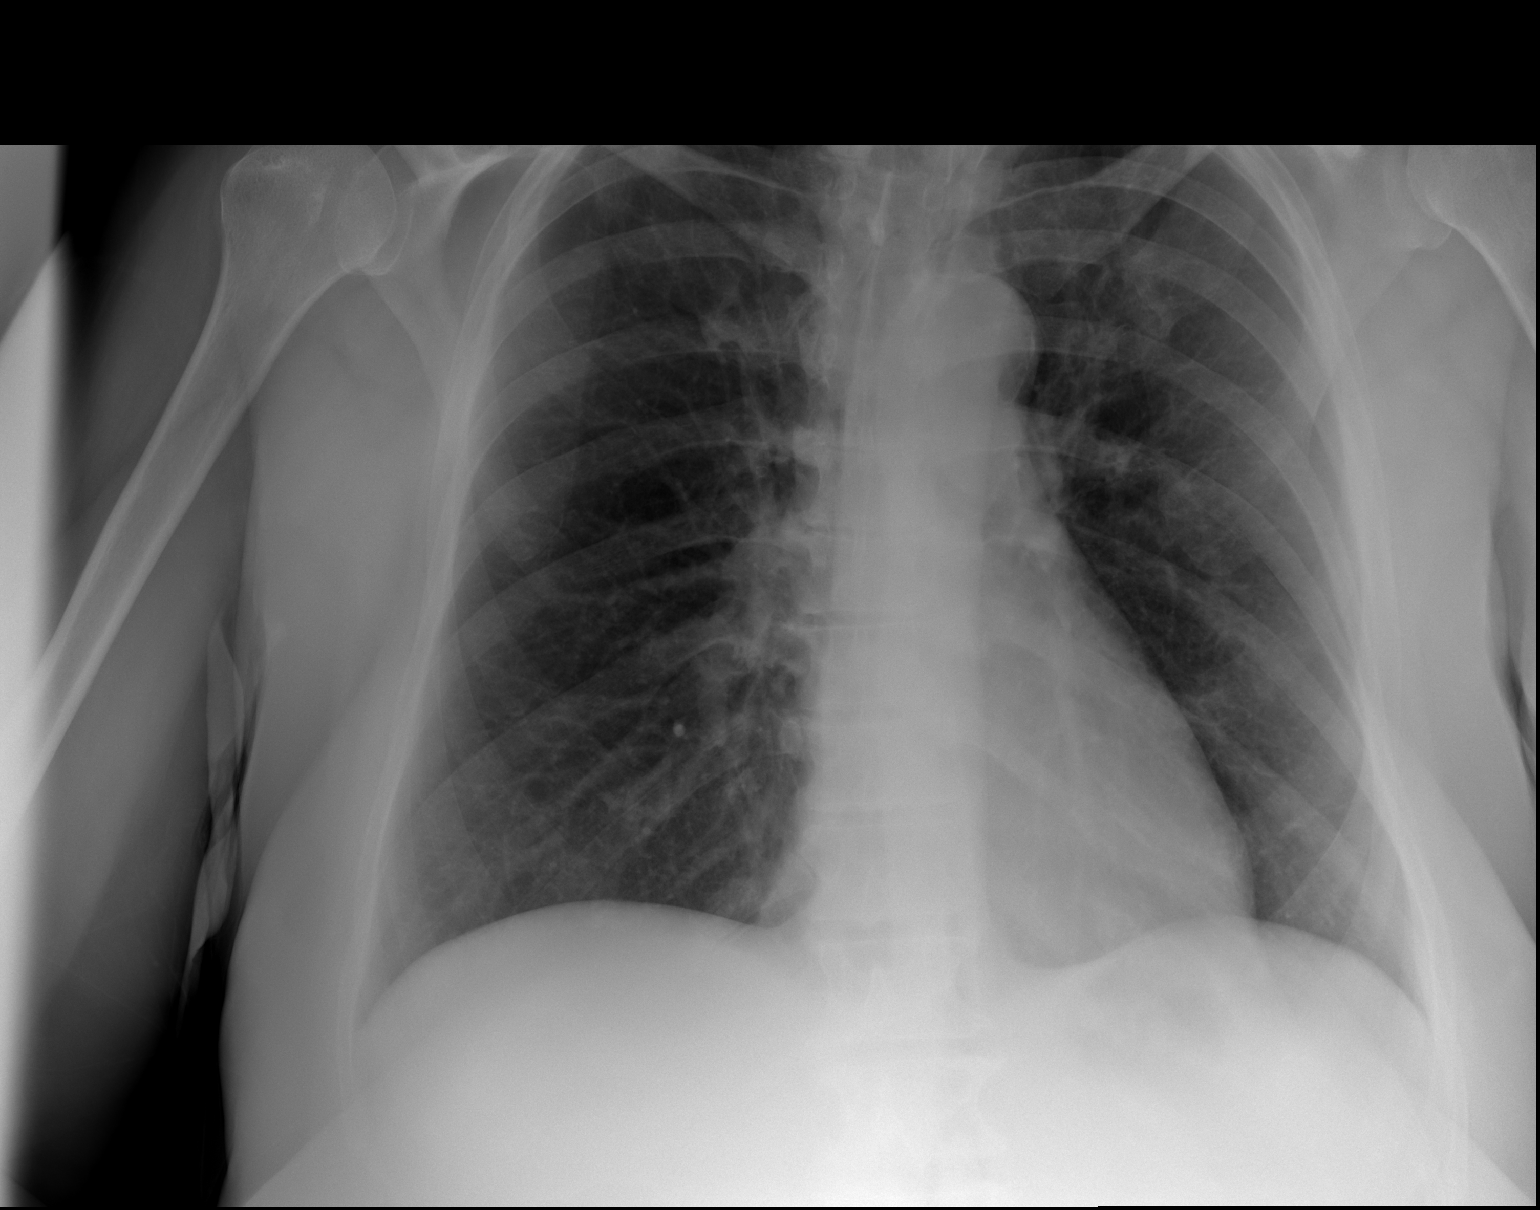

[2 of 2 positions shown; findings below may reference images not displayed]

FINDINGS: The heart size and mediastinal contours are within normal limits.
Both lungs are clear. The visualized skeletal structures are
unremarkable.
IMPRESSION: No active cardiopulmonary disease.

## 2022-04-30 DIAGNOSIS — Z419 Encounter for procedure for purposes other than remedying health state, unspecified: Secondary | ICD-10-CM | POA: Diagnosis not present

## 2022-05-30 DIAGNOSIS — Z419 Encounter for procedure for purposes other than remedying health state, unspecified: Secondary | ICD-10-CM | POA: Diagnosis not present

## 2022-06-30 DIAGNOSIS — Z419 Encounter for procedure for purposes other than remedying health state, unspecified: Secondary | ICD-10-CM | POA: Diagnosis not present

## 2022-07-31 DIAGNOSIS — Z419 Encounter for procedure for purposes other than remedying health state, unspecified: Secondary | ICD-10-CM | POA: Diagnosis not present

## 2022-08-30 DIAGNOSIS — Z419 Encounter for procedure for purposes other than remedying health state, unspecified: Secondary | ICD-10-CM | POA: Diagnosis not present

## 2022-09-30 DIAGNOSIS — Z419 Encounter for procedure for purposes other than remedying health state, unspecified: Secondary | ICD-10-CM | POA: Diagnosis not present

## 2022-10-09 ENCOUNTER — Other Ambulatory Visit: Payer: Self-pay

## 2022-10-30 DIAGNOSIS — Z419 Encounter for procedure for purposes other than remedying health state, unspecified: Secondary | ICD-10-CM | POA: Diagnosis not present

## 2022-11-30 DIAGNOSIS — Z419 Encounter for procedure for purposes other than remedying health state, unspecified: Secondary | ICD-10-CM | POA: Diagnosis not present

## 2022-12-31 DIAGNOSIS — Z419 Encounter for procedure for purposes other than remedying health state, unspecified: Secondary | ICD-10-CM | POA: Diagnosis not present

## 2023-01-29 DIAGNOSIS — Z419 Encounter for procedure for purposes other than remedying health state, unspecified: Secondary | ICD-10-CM | POA: Diagnosis not present

## 2023-02-04 ENCOUNTER — Encounter: Payer: Self-pay | Admitting: Family Medicine

## 2023-02-04 ENCOUNTER — Ambulatory Visit (INDEPENDENT_AMBULATORY_CARE_PROVIDER_SITE_OTHER): Payer: Medicaid Other | Admitting: Family Medicine

## 2023-02-04 VITALS — BP 122/70 | HR 55 | Temp 98.9°F | Ht 66.0 in | Wt 224.0 lb

## 2023-02-04 DIAGNOSIS — G8929 Other chronic pain: Secondary | ICD-10-CM

## 2023-02-04 DIAGNOSIS — E876 Hypokalemia: Secondary | ICD-10-CM | POA: Diagnosis not present

## 2023-02-04 DIAGNOSIS — M25561 Pain in right knee: Secondary | ICD-10-CM

## 2023-02-04 DIAGNOSIS — M25562 Pain in left knee: Secondary | ICD-10-CM

## 2023-02-04 NOTE — Progress Notes (Signed)
   Subjective:    Patient ID: Adrienne Garcia, female    DOB: 04-29-1966, 57 y.o.   MRN: [id number]  Knee Pain     Patient has not been seen in quite some time.  She presents today with complaint of chronic pain in both knees.  She states this got to the point she can barely walk.  When she puts weight on her knees, she will feel a sharp stabbing pain that catches her from time to time.  She also reports crepitus in both knee joints.  She reports periodic effusions.  She has pain with range of motion.  She states that she is not able to fully flex her left knee due to pain.  She denies any laxity.  The knee does not feel like it is going to give out.  She denies any locking in the knee.  On physical exam there is no laxity to varus or valgus stress and she has negative anterior and posterior drawer sign however she has significant tenderness to palpation of the medial joint line bilaterally.  She has crepitus with passive range of motion.  She has pain anteriorly with Apley grind in both knees.  Review of Systems     Objective:   Physical Exam Constitutional:      General: She is not in acute distress.    Appearance: She is obese. She is not ill-appearing or toxic-appearing.  Cardiovascular:     Rate and Rhythm: Normal rate and regular rhythm.     Heart sounds: Normal heart sounds.  Pulmonary:     Effort: Pulmonary effort is normal.     Breath sounds: Normal breath sounds.  Musculoskeletal:     Right knee: Deformity and effusion present. Decreased range of motion. Tenderness present over the medial joint line. No LCL laxity, MCL laxity, ACL laxity or PCL laxity. Normal meniscus.     Left knee: Deformity present. Decreased range of motion. Tenderness present over the medial joint line. No LCL laxity, MCL laxity, ACL laxity or PCL laxity.Normal meniscus.  Neurological:     Mental Status: She is alert.           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  I suspect bone-on-bone.  Obtain x-rays of both knees to characterize further.  Using sterile technique, I injected the right knee with 2 cc lidocaine, 2 cc of Marcaine, and 2 cc of 40 mg/mL Kenalog.  Patient tolerated procedure well without complication.  I then performed the same injection on the left knee using 2 cc lidocaine, 2 cc of Marcaine, and 2 cc of 40 mg/mL Kenalog.  Reassess in 1 month to see how the patient is doing or sooner if worsening.

## 2023-02-05 ENCOUNTER — Ambulatory Visit
Admission: RE | Admit: 2023-02-05 | Discharge: 2023-02-05 | Disposition: A | Discharge status: Home / Self Care | Payer: Medicaid Other | Source: Ambulatory Visit | Attending: Family Medicine | Admitting: Family Medicine

## 2023-02-05 DIAGNOSIS — G8929 Other chronic pain: Secondary | ICD-10-CM

## 2023-02-05 LAB — BASIC METABOLIC PANEL WITH GFR
BUN: 15 mg/dL (ref 7–25)
CO2: 26 mmol/L (ref 20–32)
Calcium: 9.7 mg/dL (ref 8.6–10.4)
Chloride: 104 mmol/L (ref 98–110)
Creat: 0.71 mg/dL (ref 0.50–1.03)
Glucose, Bld: 143 mg/dL — ABNORMAL HIGH (ref 65–99)
Potassium: 4.6 mmol/L (ref 3.5–5.3)
Sodium: 139 mmol/L (ref 135–146)
eGFR: 100 mL/min/{1.73_m2} (ref >=60)

## 2023-03-01 DIAGNOSIS — Z419 Encounter for procedure for purposes other than remedying health state, unspecified: Secondary | ICD-10-CM | POA: Diagnosis not present

## 2023-03-24 ENCOUNTER — Other Ambulatory Visit: Payer: Self-pay | Admitting: Family Medicine

## 2023-03-24 DIAGNOSIS — Z1231 Encounter for screening mammogram for malignant neoplasm of breast: Secondary | ICD-10-CM

## 2023-03-31 DIAGNOSIS — Z419 Encounter for procedure for purposes other than remedying health state, unspecified: Secondary | ICD-10-CM | POA: Diagnosis not present

## 2023-04-02 ENCOUNTER — Encounter: Payer: Self-pay | Admitting: Family Medicine

## 2023-04-02 ENCOUNTER — Ambulatory Visit (INDEPENDENT_AMBULATORY_CARE_PROVIDER_SITE_OTHER): Payer: Medicaid Other | Admitting: Family Medicine

## 2023-04-02 VITALS — BP 124/72 | HR 62 | Temp 98.4°F | Ht 66.0 in | Wt 216.2 lb

## 2023-04-02 DIAGNOSIS — M1711 Unilateral primary osteoarthritis, right knee: Secondary | ICD-10-CM | POA: Diagnosis not present

## 2023-04-02 DIAGNOSIS — R739 Hyperglycemia, unspecified: Secondary | ICD-10-CM

## 2023-04-02 DIAGNOSIS — R42 Dizziness and giddiness: Secondary | ICD-10-CM

## 2023-04-02 MED ORDER — MELOXICAM 15 MG PO TABS: 15.0000 mg | ORAL_TABLET | Freq: Every day | ORAL | 1 refills | Status: DC

## 2023-04-02 NOTE — Progress Notes (Signed)
Subjective:    Patient ID: Adrienne Garcia, female    DOB: Aug 04, 1966, 57 y.o.   MRN: [id number]  Knee Pain    02/04/23 Patient has not been seen in quite some time.  She presents today with complaint of chronic pain in both knees.  She states this got to the point she can barely walk.  When she puts weight on her knees, she will feel a sharp stabbing pain that catches her from time to time.  She also reports crepitus in both knee joints.  She reports periodic effusions.  She has pain with range of motion.  She states that she is not able to fully flex her left knee due to pain.  She denies any laxity.  The knee does not feel like it is going to give out.  She denies any locking in the knee.  On physical exam there is no laxity to varus or valgus stress and she has negative anterior and posterior drawer sign however she has significant tenderness to palpation of the medial joint line bilaterally.  She has crepitus with passive range of motion.  She has pain anteriorly with Apley grind in both knees.  At that time, my plan was:  I believe the patient has severe osteoarthritis in both knees.  I suspect bone-on-bone.  Obtain x-rays of both knees to characterize further.  Using sterile technique, I injected the right knee with 2 cc lidocaine, 2 cc of Marcaine, and 2 cc of 40 mg/mL Kenalog.  Patient tolerated procedure well without complication.  I then performed the same injection on the left knee using 2 cc lidocaine, 2 cc of Marcaine, and 2 cc of 40 mg/mL Kenalog.  Reassess in 1 month to see how the patient is doing or sooner if worsening.  04/02/23 Xrays show tricompartmental arthritis bilaterally.  Patient saw about 1 month worth of benefit from the cortisone injection but she is back to having severe pain in her right knee.  She has a slight effusion in her right knee today.  She is not taking any NSAIDs.  However she has pain whenever she takes a step.  However she is also having dizziness.  She states  that she will feel extremely lightheaded like she could pass out.  She denies any tachycardia or irregular heartbeats.  She is lost weight since her last visit.  She does report polyuria and polydipsia.  A fasting blood sugar last time was 146.  She also has a history of anemia.  She denies any vertigo.  She does have a drop in her blood pressure 10 points with standing today.  Therefore I believe that she is having orthostatic dizziness brought on by a low circulating blood volume.  I am concerned that her diabetes could potentially be causing that what could she be anemic.  She states that she feels like she is drinking plenty of fluids.  She denies any chest pain or angina or shortness of breath Wt Readings from Last 3 Encounters:  04/02/23 216 lb 3.2 oz (98.1 kg)  02/04/23 224 lb (101.6 kg)  11/26/20 221 lb 3.2 oz (100.3 kg)    Past Medical History:  Diagnosis Date   Anemia    Anxiety    Arthritis    knees   Asthma    Basal cell carcinoma of neck    "front of my neck; it was a melanoma"   Bipolar disorder (HCC)    COPD (chronic obstructive pulmonary disease) (HCC)  Daily headache    Depression    Family history of adverse reaction to anesthesia    MOM-HARD TO WAKE UP   Fibromyalgia    GERD (gastroesophageal reflux disease)    Heart murmur    H/O   History of hiatal hernia    SMALL   Intractable nausea and vomiting    Migraine    "probably twice/month" (09/19/2015)   Obesity    PTSD (post-traumatic stress disorder)    Thyroid disease    Past Surgical History:  Procedure Laterality Date   CYSTOCELE REPAIR N/A 09/24/2020   Procedure: ANTERIOR REPAIR (CYSTOCELE);  Surgeon: Nadara Mustard, MD;  Location: [facility];  Service: Gynecology;  Laterality: N/A;   MOLE REMOVAL  ~ 2013   "front side of my neck"   MULTIPLE TOOTH EXTRACTIONS  ~ 2011   VAGINAL HYSTERECTOMY Bilateral 09/24/2020   Procedure: HYSTERECTOMY VAGINAL BILATERAL SALPINGO-OOPHERECTOMY;  Surgeon: Nadara Mustard, MD;  Location: [facility];  Service: Gynecology;  Laterality: Bilateral;   Current Outpatient Medications on File Prior to Visit  Medication Sig Dispense Refill   pantoprazole (PROTONIX) 40 MG tablet Take 1 tablet (40 mg total) by mouth as needed. 90 tablet 1   promethazine (PHENERGAN) 25 MG tablet TAKE 1 TABLET (25 MG TOTAL) BY MOUTH EVERY 6 (SIX) HOURS AS NEEDED FOR NAUSEA OR VOMITING. 30 tablet 0   [DISCONTINUED] potassium chloride (K-DUR) 10 MEQ tablet Take 1 tablet (10 mEq total) by mouth daily. 30 tablet 0   No current facility-administered medications on file prior to visit.   Allergies  Allergen Reactions   Ciprofloxacin Hives     Review of Systems     Objective:   Physical Exam Constitutional:      General: She is not in acute distress.    Appearance: She is obese. She is not ill-appearing or toxic-appearing.  Cardiovascular:     Rate and Rhythm: Normal rate and regular rhythm.     Heart sounds: Normal heart sounds.  Pulmonary:     Effort: Pulmonary effort is normal.     Breath sounds: Normal breath sounds.  Musculoskeletal:     Right knee: Deformity and effusion present. Decreased range of motion. Tenderness present over the medial joint line. No LCL laxity, MCL laxity, ACL laxity or PCL laxity. Normal meniscus.     Left knee: Deformity present. Decreased range of motion. Tenderness present over the medial joint line. No LCL laxity, MCL laxity, ACL laxity or PCL laxity.Normal meniscus.  Neurological:     Mental Status: She is alert.           Assessment & Plan:   Primary osteoarthritis of right knee - Plan: Ambulatory referral to Orthopedic Surgery  High blood sugar - Plan: Hemoglobin A1c, CBC with Differential/Platelet  Orthostatic dizziness I will refer the patient to orthopedics for osteoarthritis in both knees.  In the meantime she can start meloxicam 15 mg daily for osteoarthritis.  I will check a CBC to see if the patient is anemic.  I am also going  to check an A1c to see if she has diabetes given her random sugar.  Both of these can cause low circulating blood volume that could lead to orthostatic dizziness.  I believe the dizziness is due to drops in her blood pressure.  She denies any symptoms that would make me suspect a cardiac arrhythmia.  However if blood work is normal the neck step would be cardiology referral for Zio patch to rule out  arrhythmias

## 2023-04-03 LAB — CBC WITH DIFFERENTIAL/PLATELET
Absolute Monocytes: 573 cells/uL (ref 200–950)
Basophils Absolute: 73 cells/uL (ref 0–200)
Basophils Relative: 0.8 %
Eosinophils Absolute: 73 cells/uL (ref 15–500)
Eosinophils Relative: 0.8 %
HCT: 42 % (ref 35.0–45.0)
Hemoglobin: 13.9 g/dL (ref 11.7–15.5)
Lymphs Abs: 2985 cells/uL (ref 850–3900)
MCH: 31.8 pg (ref 27.0–33.0)
MCHC: 33.1 g/dL (ref 32.0–36.0)
MCV: 96.1 fL (ref 80.0–100.0)
MPV: 12.8 fL — ABNORMAL HIGH (ref 7.5–12.5)
Monocytes Relative: 6.3 %
Neutro Abs: 5396 cells/uL (ref 1500–7800)
Neutrophils Relative %: 59.3 %
Platelets: 193 10*3/uL (ref 140–400)
RBC: 4.37 10*6/uL (ref 3.80–5.10)
RDW: 12.7 % (ref 11.0–15.0)
Total Lymphocyte: 32.8 %
WBC: 9.1 10*3/uL (ref 3.8–10.8)

## 2023-04-03 LAB — HEMOGLOBIN A1C
Hgb A1c MFr Bld: 5.6 %{Hb} (ref <5.7)
Mean Plasma Glucose: 114 mg/dL
eAG (mmol/L): 6.3 mmol/L

## 2023-04-12 NOTE — Progress Notes (Unsigned)
Office Visit Note   Patient: Adrienne Garcia           Date of Birth: 11-12-1966           MRN: [id number] Visit Date: 04/13/2023              Requested by: Adrienne Brooks, MD [address] [place],  [place] [address] PCP: Adrienne Brooks, MD   Assessment & Plan: Visit Diagnoses:  1. Primary osteoarthritis of right knee   2. Primary osteoarthritis of left knee     Plan: Impression is severe left knee degenerative joint disease secondary to Osteoarthritis.  Bone on bone joint space narrowing is seen on radiographs with mild varus alignment.  At this point, conservative treatments fail to provide any significant relief and the pain is severely affecting ADLs and quality of life.  Based on treatment options, the patient has elected to move forward with a knee replacement.  We have discussed the surgical risks that include but are not limited to infection, DVT, leg length discrepancy, stiffness, numbness, tingling, incomplete relief of pain.  Recovery and prognosis were also reviewed.    Bilateral knees injected with steroid today.  We will look to schedule a left total knee replacement for sometime in September.  Adrienne Garcia will call the patient to confirm surgery time.  Anticoagulants: No antithrombotic Postop anticoagulation: Aspirin 81 mg Diabetic: Yes  Nickel allergy: No Prior DVT/PE: No Tobacco use: Yes 3 cigarettes a day Clearances needed for surgery: None Anticipated discharge dispo: Home   Follow-Up Instructions: No follow-ups on file.   Orders:  No orders of the defined types were placed in this encounter.  No orders of the defined types were placed in this encounter.     Procedures: Large Joint Inj: bilateral knee on 04/13/2023 8:20 AM Indications: pain Details: 22 G needle  Arthrogram: No  Medications (Right): 2 mL lidocaine 1 %; 2 mL bupivacaine 0.5 %; 40 mg methylPREDNISolone acetate 40 MG/ML Medications (Left): 2 mL lidocaine 1 %; 2 mL bupivacaine 0.5  %; 40 mg methylPREDNISolone acetate 40 MG/ML Outcome: tolerated well, no immediate complications Patient was prepped and draped in the usual sterile fashion.       Clinical Data: No additional findings.   Subjective: Chief Complaint  Patient presents with   Right Knee - Pain    HPI Adrienne Garcia is a 57 year old female here for bilateral knee pain worse on the left.  She has had worsening pain over the last year and a half.  Denies any injuries.  Has had steroid injections in the past with temporary relief.  Her last set of injections was 3 months ago.  She has chronic pain with ADLs interferes with sleeping at night.  She is taking meloxicam with minimal relief.  Review of Systems  Constitutional: Negative.   HENT: Negative.    Eyes: Negative.   Respiratory: Negative.    Cardiovascular: Negative.   Endocrine: Negative.   Musculoskeletal: Negative.   Neurological: Negative.   Hematological: Negative.   Psychiatric/Behavioral: Negative.    All other systems reviewed and are negative.    Objective: Vital Signs: LMP 12/16/2011   Physical Exam Vitals and nursing note reviewed.  Constitutional:      Appearance: She is well-developed.  HENT:     Head: Atraumatic.     Nose: Nose normal.  Eyes:     Extraocular Movements: Extraocular movements intact.  Cardiovascular:     Pulses: Normal pulses.  Pulmonary:  Effort: Pulmonary effort is normal.  Abdominal:     Palpations: Abdomen is soft.  Musculoskeletal:     Cervical back: Neck supple.  Skin:    General: Skin is warm.     Capillary Refill: Capillary refill takes less than 2 seconds.  Neurological:     Mental Status: She is alert. Mental status is at baseline.  Psychiatric:        Behavior: Behavior normal.        Thought Content: Thought content normal.        Judgment: Judgment normal.    Ortho Exam Examination bilateral knees show varus bowing.  Pain and crepitus with range of motion.  Collaterals and  cruciates are stable.  Medial joint line tenderness. Specialty Comments:  No specialty comments available.  Imaging: No results found.   PMFS History: Patient Active Problem List   Diagnosis Date Noted   S/P vaginal hysterectomy 10/02/2020   Uterine prolapse 09/04/2020   Dyspareunia due to medical condition in female 09/04/2020   Urinary retention 09/04/2020   Influenza B 01/29/2016   Influenza 01/29/2016   Status asthmaticus    Bipolar affective disorder, currently manic, moderate (HCC)    Acute bronchitis with asthma with acute exacerbation 01/26/2016   Acute bronchitis with bronchospasm 01/26/2016   Bronchitis with bronchospasm    Costochondritis, acute    Intractable nausea and vomiting 09/19/2015   Hyperglycemia 09/19/2015   Leukocytosis 09/19/2015   Thrombocytopenia (HCC) 09/19/2015   Diarrhea 09/19/2015   Obesity 09/19/2015   Thyroid disease    Depression    Past Medical History:  Diagnosis Date   Anemia    Anxiety    Arthritis    knees   Asthma    Basal cell carcinoma of neck    "front of my neck; it was a melanoma"   Bipolar disorder (HCC)    COPD (chronic obstructive pulmonary disease) (HCC)    Daily headache    Depression    Family history of adverse reaction to anesthesia    MOM-HARD TO WAKE UP   Fibromyalgia    GERD (gastroesophageal reflux disease)    Heart murmur    H/O   History of hiatal hernia    SMALL   Intractable nausea and vomiting    Migraine    "probably twice/month" (09/19/2015)   Obesity    PTSD (post-traumatic stress disorder)    Thyroid disease     Family History  Problem Relation Age of Onset   CAD Mother    Hypertension Other        sibling   Diabetes Other        sibling   Breast cancer Sister    Colon cancer Neg Hx    Colon polyps Neg Hx    Esophageal cancer Neg Hx    Rectal cancer Neg Hx    Stomach cancer Neg Hx     Past Surgical History:  Procedure Laterality Date   CYSTOCELE REPAIR N/A 09/24/2020    Procedure: ANTERIOR REPAIR (CYSTOCELE);  Surgeon: Adrienne Mustard, MD;  Location: [facility];  Service: Gynecology;  Laterality: N/A;   MOLE REMOVAL  ~ 2013   "front side of my neck"   MULTIPLE TOOTH EXTRACTIONS  ~ 2011   VAGINAL HYSTERECTOMY Bilateral 09/24/2020   Procedure: HYSTERECTOMY VAGINAL BILATERAL SALPINGO-OOPHERECTOMY;  Surgeon: Adrienne Mustard, MD;  Location: [facility];  Service: Gynecology;  Laterality: Bilateral;   Social History   Occupational History   Not on file  Tobacco  Use   Smoking status: Former    Packs/day: 0.50    Years: 28.00    Additional pack years: 0.00    Total pack years: 14.00    Types: Cigarettes    Quit date: 09/17/2013    Years since quitting: 9.5   Smokeless tobacco: Never   Tobacco comments:    "quit smoking ~ 2014"  Vaping Use   Vaping Use: Never used  Substance and Sexual Activity   Alcohol use: No   Drug use: Not Currently    Types: Marijuana    Comment: 09/19/2015 "probably twice/yr":   Sexual activity: Not Currently

## 2023-04-13 ENCOUNTER — Encounter: Payer: Self-pay | Admitting: Orthopaedic Surgery

## 2023-04-13 ENCOUNTER — Ambulatory Visit: Payer: Medicaid Other | Admitting: Orthopaedic Surgery

## 2023-04-13 DIAGNOSIS — M170 Bilateral primary osteoarthritis of knee: Secondary | ICD-10-CM

## 2023-04-13 DIAGNOSIS — M1711 Unilateral primary osteoarthritis, right knee: Secondary | ICD-10-CM | POA: Diagnosis not present

## 2023-04-13 DIAGNOSIS — M1712 Unilateral primary osteoarthritis, left knee: Secondary | ICD-10-CM | POA: Diagnosis not present

## 2023-04-13 MED ORDER — METHYLPREDNISOLONE ACETATE 40 MG/ML IJ SUSP
40.0000 mg | INTRAMUSCULAR | Status: AC | PRN
  Administered 2023-04-13: 40 mg via INTRA_ARTICULAR

## 2023-04-13 MED ORDER — BUPIVACAINE HCL 0.5 % IJ SOLN
2.0000 mL | INTRAMUSCULAR | Status: AC | PRN
  Administered 2023-04-13: 2 mL via INTRA_ARTICULAR

## 2023-04-13 MED ORDER — LIDOCAINE HCL 1 % IJ SOLN
2.0000 mL | INTRAMUSCULAR | Status: AC | PRN
  Administered 2023-04-13: 2 mL

## 2023-04-13 MED ORDER — LIDOCAINE HCL 1 % IJ SOLN
2.0000 mL | INTRAMUSCULAR | Status: AC | PRN
Start: 2023-04-13 — End: 2023-04-13
  Administered 2023-04-13: 2 mL

## 2023-04-21 ENCOUNTER — Inpatient Hospital Stay: Admission: RE | Admit: 2023-04-21 | Payer: Medicaid Other | Source: Ambulatory Visit

## 2023-05-01 DIAGNOSIS — Z419 Encounter for procedure for purposes other than remedying health state, unspecified: Secondary | ICD-10-CM | POA: Diagnosis not present

## 2023-05-27 ENCOUNTER — Ambulatory Visit
Admission: RE | Admit: 2023-05-27 | Discharge: 2023-05-27 | Disposition: A | Discharge status: Home / Self Care | Payer: Medicaid Other | Source: Ambulatory Visit | Attending: Family Medicine | Admitting: Family Medicine

## 2023-05-27 DIAGNOSIS — Z1231 Encounter for screening mammogram for malignant neoplasm of breast: Secondary | ICD-10-CM

## 2023-05-31 DIAGNOSIS — Z419 Encounter for procedure for purposes other than remedying health state, unspecified: Secondary | ICD-10-CM | POA: Diagnosis not present

## 2023-06-25 ENCOUNTER — Emergency Department (HOSPITAL_COMMUNITY): Payer: Medicaid Other

## 2023-06-25 ENCOUNTER — Encounter (HOSPITAL_COMMUNITY): Payer: Self-pay

## 2023-06-25 ENCOUNTER — Emergency Department (HOSPITAL_COMMUNITY)
Admission: EM | Admit: 2023-06-25 | Discharge: 2023-06-25 | Disposition: A | Discharge status: Home / Self Care | Payer: Medicaid Other | Attending: Emergency Medicine | Admitting: Emergency Medicine

## 2023-06-25 ENCOUNTER — Other Ambulatory Visit: Payer: Self-pay

## 2023-06-25 DIAGNOSIS — R0789 Other chest pain: Secondary | ICD-10-CM | POA: Diagnosis not present

## 2023-06-25 DIAGNOSIS — R079 Chest pain, unspecified: Secondary | ICD-10-CM | POA: Diagnosis not present

## 2023-06-25 DIAGNOSIS — R61 Generalized hyperhidrosis: Secondary | ICD-10-CM | POA: Diagnosis not present

## 2023-06-25 DIAGNOSIS — R0602 Shortness of breath: Secondary | ICD-10-CM | POA: Diagnosis not present

## 2023-06-25 DIAGNOSIS — R112 Nausea with vomiting, unspecified: Secondary | ICD-10-CM | POA: Insufficient documentation

## 2023-06-25 LAB — CBC
HCT: 42.9 % (ref 36.0–46.0)
Hemoglobin: 14.1 g/dL (ref 12.0–15.0)
MCH: 31.1 pg (ref 26.0–34.0)
MCHC: 32.9 g/dL (ref 30.0–36.0)
MCV: 94.5 fL (ref 80.0–100.0)
Platelets: 175 10*3/uL (ref 150–400)
RBC: 4.54 MIL/uL (ref 3.87–5.11)
RDW: 13.3 % (ref 11.5–15.5)
WBC: 10.4 10*3/uL (ref 4.0–10.5)
nRBC: 0 % (ref 0.0–0.2)

## 2023-06-25 LAB — COMPREHENSIVE METABOLIC PANEL
ALT: 15 U/L (ref 0–44)
AST: 14 U/L — ABNORMAL LOW (ref 15–41)
Albumin: 4.2 g/dL (ref 3.5–5.0)
Alkaline Phosphatase: 93 U/L (ref 38–126)
Anion gap: 11 (ref 5–15)
BUN: 10 mg/dL (ref 6–20)
CO2: 23 mmol/L (ref 22–32)
Calcium: 9.3 mg/dL (ref 8.9–10.3)
Chloride: 103 mmol/L (ref 98–111)
Creatinine, Ser: 0.71 mg/dL (ref 0.44–1.00)
GFR, Estimated: >60 mL/min (ref >60)
Glucose, Bld: 106 mg/dL — ABNORMAL HIGH (ref 70–99)
Potassium: 3.9 mmol/L (ref 3.5–5.1)
Sodium: 137 mmol/L (ref 135–145)
Total Bilirubin: 0.5 mg/dL (ref 0.3–1.2)
Total Protein: 6.8 g/dL (ref 6.5–8.1)

## 2023-06-25 LAB — LIPASE, BLOOD: Lipase: 60 U/L — ABNORMAL HIGH (ref 11–51)

## 2023-06-25 LAB — TROPONIN I (HIGH SENSITIVITY)
Troponin I (High Sensitivity): 3 ng/L (ref <18)
Troponin I (High Sensitivity): 3 ng/L (ref <18)

## 2023-06-25 MED ORDER — ONDANSETRON HCL 4 MG/2ML IJ SOLN
4.0000 mg | Freq: Once | INTRAMUSCULAR | Status: AC
  Administered 2023-06-25: 4 mg via INTRAVENOUS
  Filled 2023-06-25: qty 2

## 2023-06-25 MED ORDER — SODIUM CHLORIDE 0.9 % IV BOLUS
1000.0000 mL | Freq: Once | INTRAVENOUS | Status: AC
  Administered 2023-06-25: 1000 mL via INTRAVENOUS

## 2023-06-25 MED ORDER — CYCLOBENZAPRINE HCL 5 MG PO TABS: 5.0000 mg | ORAL_TABLET | Freq: Three times a day (TID) | ORAL | 0 refills | Status: DC | PRN

## 2023-06-25 MED ORDER — PROMETHAZINE HCL 25 MG PO TABS: 25.0000 mg | ORAL_TABLET | Freq: Four times a day (QID) | ORAL | 0 refills | Status: DC | PRN

## 2023-06-25 MED ORDER — KETOROLAC TROMETHAMINE 30 MG/ML IJ SOLN
30.0000 mg | Freq: Once | INTRAMUSCULAR | Status: AC
  Administered 2023-06-25: 30 mg via INTRAVENOUS
  Filled 2023-06-25: qty 1

## 2023-06-25 MED ORDER — FAMOTIDINE IN NACL 20-0.9 MG/50ML-% IV SOLN
20.0000 mg | Freq: Once | INTRAVENOUS | Status: AC
  Administered 2023-06-25: 20 mg via INTRAVENOUS
  Filled 2023-06-25: qty 50

## 2023-06-25 MED ORDER — ONDANSETRON 4 MG PO TBDP: ORAL_TABLET | ORAL | 0 refills | Status: DC

## 2023-06-25 MED ORDER — CYCLOBENZAPRINE HCL 10 MG PO TABS
5.0000 mg | ORAL_TABLET | Freq: Once | ORAL | Status: AC
  Administered 2023-06-25: 5 mg via ORAL
  Filled 2023-06-25: qty 1

## 2023-06-25 NOTE — ED Triage Notes (Signed)
Pt c/o int left sided chest pain that radiates to right side of chest and to right arm, N/Vx2wks. Pt states it's getting worse the last 3 days

## 2023-06-25 NOTE — Discharge Instructions (Addendum)
Take Phenergan for nausea  Stay hydrated  Take Flexeril as needed for muscle spasm  I have referred you to cardiology for follow-up  Return to ER if you have worse chest pain with vomiting

## 2023-06-25 NOTE — ED Provider Triage Note (Signed)
Emergency Medicine Provider Triage Evaluation Note  Adrienne Garcia , a 57 y.o. female  was evaluated in triage.  Pt complains of presents emergency department with chief complaint of chest pain shortness of breath.  She has been having episodes of intermittent chest pain ongoing for several weeks however has had more persistent chest pain that is waxing and waning, severe.  She becomes diaphoretic and short of breath.  She states that she was admitted a few years back with similar symptoms.  She is unsure if she has any cardiac disease..  Her pain was so severe she began vomiting.  Review of Systems  Positive: Chest pain shortness of breath Negative: FEVER   Physical Exam  BP 133/89 (BP Location: Right Arm)   Pulse (!) 47   Temp 98.3 F (36.8 C) (Oral)   Resp 18   Ht 5\' 6"  (1.676 m)   Wt 98.1 kg   LMP 12/16/2011   SpO2 97%   BMI 34.91 kg/m  Gen:   Awake, no distress   Resp:  Normal effort  MSK:   Moves extremities without difficulty  Other:    Medical Decision Making  Medically screening exam initiated at 3:57 PM.  Appropriate orders placed.  Adrienne Garcia was informed that the remainder of the evaluation will be completed by another provider, this initial triage assessment does not replace that evaluation, and the importance of remaining in the ED until their evaluation is complete.     Arthor Captain, PA-C 06/25/23 1559

## 2023-06-25 NOTE — ED Provider Notes (Signed)
[place] EMERGENCY DEPARTMENT AT [facility] Provider Note   CSN: [id number] Arrival date & time: 06/25/23  1419     History  Chief Complaint  Patient presents with   Emesis   Chest Pain    Adrienne Garcia is a 57 y.o. female here presenting with chest pain and vomiting.  Patient states that she has intermittent chest pain and vomiting episodes for several weeks.  Patient states that sometimes she gets diaphoretic with it.  Patient was diagnosed with costochondritis previously.  Patient states that she had previous admission for chest pain but never had any stents in her heart.  Denies any recent travel or history of blood clots.  The history is provided by the patient.       Home Medications Prior to Admission medications   Medication Sig Start Date End Date Taking? Authorizing Provider  meloxicam (MOBIC) 15 MG tablet Take 1 tablet (15 mg total) by mouth daily. 04/02/23   Donita Brooks, MD  pantoprazole (PROTONIX) 40 MG tablet Take 1 tablet (40 mg total) by mouth as needed. 12/20/20   Donita Brooks, MD  promethazine (PHENERGAN) 25 MG tablet TAKE 1 TABLET (25 MG TOTAL) BY MOUTH EVERY 6 (SIX) HOURS AS NEEDED FOR NAUSEA OR VOMITING. 12/20/20   Donita Brooks, MD  potassium chloride (K-DUR) 10 MEQ tablet Take 1 tablet (10 mEq total) by mouth daily. 07/07/17 03/27/20  Audry Pili, PA-C      Allergies    Ciprofloxacin    Review of Systems   Review of Systems  Cardiovascular:  Positive for chest pain.  Gastrointestinal:  Positive for vomiting.  All other systems reviewed and are negative.   Physical Exam Updated Vital Signs BP 132/75   Pulse (!) 54   Temp 98.2 F (36.8 C)   Resp (!) 30   Ht 5\' 6"  (1.676 m)   Wt 98.1 kg   LMP 12/16/2011   SpO2 98%   BMI 34.91 kg/m  Physical Exam Vitals and nursing note reviewed.  Constitutional:      Comments: Uncomfortable  HENT:     Head: Normocephalic.  Eyes:     Extraocular Movements: Extraocular movements  intact.     Pupils: Pupils are equal, round, and reactive to light.  Cardiovascular:     Rate and Rhythm: Normal rate and regular rhythm.     Heart sounds: Normal heart sounds.  Chest:     Comments: + Reproducible left chest wall tenderness Abdominal:     General: Bowel sounds are normal.     Palpations: Abdomen is soft.  Musculoskeletal:     Cervical back: Normal range of motion and neck supple.  Skin:    General: Skin is warm.     Capillary Refill: Capillary refill takes less than 2 seconds.  Neurological:     General: No focal deficit present.     Mental Status: She is alert and oriented to person, place, and time.  Psychiatric:        Mood and Affect: Mood normal.        Behavior: Behavior normal.     ED Results / Procedures / Treatments   Labs (all labs ordered are listed, but only abnormal results are displayed) Labs Reviewed  COMPREHENSIVE METABOLIC PANEL - Abnormal; Notable for the following components:      Result Value   Glucose, Bld 106 (*)    AST 14 (*)    All other components within normal limits  LIPASE, BLOOD -  Abnormal; Notable for the following components:   Lipase 60 (*)    All other components within normal limits  CBC  TROPONIN I (HIGH SENSITIVITY)  TROPONIN I (HIGH SENSITIVITY)    EKG EKG Interpretation Date/Time:  Friday June 25 2023 14:50:34 EDT Ventricular Rate:  50 PR Interval:  132 QRS Duration:  92 QT Interval:  444 QTC Calculation: 404 R Axis:   62  Text Interpretation: Sinus bradycardia Otherwise normal ECG When compared with ECG of 08-Jul-2021 19:32, No significant change since last tracing Confirmed by Richardean Canal [phone]) on 06/25/2023 6:13:41 PM  Radiology DG Chest 2 View  Result Date: 06/25/2023 CLINICAL DATA:  Shortness of breath EXAM: CHEST - 2 VIEW COMPARISON:  07/08/2021 FINDINGS: The heart size and mediastinal contours are within normal limits. Both lungs are clear. The visualized skeletal structures are unremarkable.  IMPRESSION: No active cardiopulmonary disease. Electronically Signed   By: Ernie Avena M.D.   On: 06/25/2023 17:20    Procedures Procedures    Medications Ordered in ED Medications  sodium chloride 0.9 % bolus 1,000 mL (1,000 mLs Intravenous New Bag/Given 06/25/23 1933)  ondansetron (ZOFRAN) injection 4 mg (4 mg Intravenous Given 06/25/23 1923)  ketorolac (TORADOL) 30 MG/ML injection 30 mg (30 mg Intravenous Given 06/25/23 1922)  cyclobenzaprine (FLEXERIL) tablet 5 mg (5 mg Oral Given 06/25/23 1923)    ED Course/ Medical Decision Making/ A&P                             Medical Decision Making [name] is a 56 y.o. female who presented with chest pain.  Likely costochondritis.  Also consider reflux or esophagitis.  Patient is already on PPIs.  Plan to get CBC and CMP and troponin x 2.  Will hydrate and reassess.  8:49 PM I reviewed patient's labs and they were unremarkable.  Patient felt better after IV fluids and Zofran.  At this point patient stable for discharge.  Suspect some costochondritis.  If she has persistent pain she can see cardiology for follow-up.  Problems Addressed: Chest pain, unspecified type: acute illness or injury Nausea and vomiting, unspecified vomiting type: acute illness or injury  Risk Prescription drug management.    Final Clinical Impression(s) / ED Diagnoses Final diagnoses:  None    Rx / DC Orders ED Discharge Orders     None         Charlynne Pander, MD 06/25/23 2050

## 2023-06-29 ENCOUNTER — Encounter: Payer: Self-pay | Admitting: Family Medicine

## 2023-06-29 ENCOUNTER — Ambulatory Visit (INDEPENDENT_AMBULATORY_CARE_PROVIDER_SITE_OTHER): Payer: Medicaid Other | Admitting: Family Medicine

## 2023-06-29 VITALS — BP 130/86 | HR 53 | Temp 98.7°F | Ht 66.0 in | Wt 210.0 lb

## 2023-06-29 DIAGNOSIS — M25562 Pain in left knee: Secondary | ICD-10-CM

## 2023-06-29 DIAGNOSIS — G8929 Other chronic pain: Secondary | ICD-10-CM

## 2023-06-29 DIAGNOSIS — M25561 Pain in right knee: Secondary | ICD-10-CM | POA: Diagnosis not present

## 2023-06-29 DIAGNOSIS — K219 Gastro-esophageal reflux disease without esophagitis: Secondary | ICD-10-CM

## 2023-06-29 MED ORDER — BUPIVACAINE HCL 0.25 % IJ SOLN
4.0000 mL | Freq: Once | INTRAMUSCULAR | Status: AC
Start: 2023-06-29 — End: 2023-06-29
  Administered 2023-06-29: 4 mL via INTRA_ARTICULAR

## 2023-06-29 MED ORDER — LIDOCAINE HCL (PF) 1 % IJ SOLN
4.0000 mL | Freq: Once | INTRAMUSCULAR | Status: AC
Start: 2023-06-29 — End: 2023-06-29
  Administered 2023-06-29: 4 mL

## 2023-06-29 MED ORDER — PROMETHAZINE HCL 25 MG PO TABS
25.0000 mg | ORAL_TABLET | Freq: Four times a day (QID) | ORAL | 1 refills | Status: DC | PRN
Start: 2023-06-29 — End: 2024-03-24

## 2023-06-29 MED ORDER — PROMETHAZINE HCL 25 MG PO TABS
25.0000 mg | ORAL_TABLET | Freq: Four times a day (QID) | ORAL | 1 refills | Status: DC | PRN
Start: 2023-06-29 — End: 2023-06-29

## 2023-06-29 MED ORDER — CYCLOBENZAPRINE HCL 5 MG PO TABS: 5.0000 mg | ORAL_TABLET | Freq: Three times a day (TID) | ORAL | 0 refills | Status: DC | PRN

## 2023-06-29 MED ORDER — PANTOPRAZOLE SODIUM 40 MG PO TBEC
40.0000 mg | DELAYED_RELEASE_TABLET | ORAL | 1 refills | Status: DC | PRN
Start: 2023-06-29 — End: 2024-03-24

## 2023-06-29 MED ORDER — TRIAMCINOLONE ACETONIDE 40 MG/ML IJ SUSP
80.0000 mg | Freq: Once | INTRAMUSCULAR | Status: AC
Start: 2023-06-29 — End: 2023-06-29
  Administered 2023-06-29: 80 mg via INTRA_ARTICULAR

## 2023-06-29 NOTE — Addendum Note (Signed)
Addended by: Venia Carbon K on: 06/29/2023 11:30 AM   Modules accepted: Orders

## 2023-06-29 NOTE — Progress Notes (Signed)
Subjective:    Patient ID: Adrienne Garcia, female    DOB: Apr 08, 1966, 57 y.o.   MRN: [id number]  Knee Pain   Patient has bilateral, tricompartmental osteoarthritis in both knees.  She is scheduled to have knee replacement surgery in October.  However she is having severe pain in both knees and is requesting a cortisone shot in both knees today. Past Medical History:  Diagnosis Date   Anemia    Anxiety    Arthritis    knees   Asthma    Basal cell carcinoma of neck    "front of my neck; it was a melanoma"   Bipolar disorder (HCC)    COPD (chronic obstructive pulmonary disease) (HCC)    Daily headache    Depression    Family history of adverse reaction to anesthesia    MOM-HARD TO WAKE UP   Fibromyalgia    GERD (gastroesophageal reflux disease)    Heart murmur    H/O   History of hiatal hernia    SMALL   Intractable nausea and vomiting    Migraine    "probably twice/month" (09/19/2015)   Obesity    PTSD (post-traumatic stress disorder)    Thyroid disease    Past Surgical History:  Procedure Laterality Date   ABDOMINAL HYSTERECTOMY     CYSTOCELE REPAIR N/A 09/24/2020   Procedure: ANTERIOR REPAIR (CYSTOCELE);  Surgeon: Nadara Mustard, MD;  Location: [facility];  Service: Gynecology;  Laterality: N/A;   MOLE REMOVAL  ~ 2013   "front side of my neck"   MULTIPLE TOOTH EXTRACTIONS  ~ 2011   VAGINAL HYSTERECTOMY Bilateral 09/24/2020   Procedure: HYSTERECTOMY VAGINAL BILATERAL SALPINGO-OOPHERECTOMY;  Surgeon: Nadara Mustard, MD;  Location: [facility];  Service: Gynecology;  Laterality: Bilateral;   Current Outpatient Medications on File Prior to Visit  Medication Sig Dispense Refill   cyclobenzaprine (FLEXERIL) 5 MG tablet Take 1 tablet (5 mg total) by mouth 3 (three) times daily as needed. 10 tablet 0   meloxicam (MOBIC) 15 MG tablet Take 1 tablet (15 mg total) by mouth daily. 30 tablet 1   pantoprazole (PROTONIX) 40 MG tablet Take 1 tablet (40 mg total) by mouth as needed.  90 tablet 1   promethazine (PHENERGAN) 25 MG tablet Take 1 tablet (25 mg total) by mouth every 6 (six) hours as needed for nausea or vomiting. 10 tablet 0   [DISCONTINUED] potassium chloride (K-DUR) 10 MEQ tablet Take 1 tablet (10 mEq total) by mouth daily. 30 tablet 0   No current facility-administered medications on file prior to visit.   Allergies  Allergen Reactions   Ciprofloxacin Hives     Review of Systems     Objective:   Physical Exam Constitutional:      General: She is not in acute distress.    Appearance: She is obese. She is not ill-appearing or toxic-appearing.  Cardiovascular:     Rate and Rhythm: Normal rate and regular rhythm.     Heart sounds: Normal heart sounds.  Pulmonary:     Effort: Pulmonary effort is normal.     Breath sounds: Normal breath sounds.  Musculoskeletal:     Right knee: Deformity and effusion present. Decreased range of motion. Tenderness present over the medial joint line. No LCL laxity, MCL laxity, ACL laxity or PCL laxity. Normal meniscus.     Left knee: Deformity present. Decreased range of motion. Tenderness present over the medial joint line. No LCL laxity, MCL laxity, ACL laxity or PCL  laxity.Normal meniscus.  Neurological:     Mental Status: She is alert.           Assessment & Plan:  Chronic pain of both knees - Plan: cyclobenzaprine (FLEXERIL) 5 MG tablet  Gastroesophageal reflux disease, unspecified whether esophagitis present - Plan: pantoprazole (PROTONIX) 40 MG tablet, promethazine (PHENERGAN) 25 MG tablet, DISCONTINUED: promethazine (PHENERGAN) 25 MG tablet Using sterile technique, I injected the right knee with 2 cc lidocaine, 2 cc of Marcaine, 2 cc of 40 mg per Kenalog.  The patient tolerated the procedure well.  I then repeated the same injection in the left knee using sterile technique.  The patient tolerated this procedure well

## 2023-07-01 DIAGNOSIS — Z419 Encounter for procedure for purposes other than remedying health state, unspecified: Secondary | ICD-10-CM | POA: Diagnosis not present

## 2023-07-19 NOTE — Progress Notes (Signed)
Adrienne Sales, MD Reason for referral-chest pain  HPI: 57 year old female for evaluation of chest pain at request of Chaney Malling, MD.  Patient seen in the emergency room July 26 with complaints of chest pain and vomiting.  Chest x-ray with no infiltrates.  Troponins normal.  Potassium 3.9, creatinine 0.71, normal liver functions, lipase 60, hemoglobin 14.1.  Chest pain felt likely musculoskeletal and cardiology now asked to evaluate.  Patient states that over the past 6 months she has had occasional chest pain.  It is under the left breast and described as a sharp pain.  Radiates to the right upper extremity at times.  Last 20 minutes and resolves.  Increases with inspiration and certain movements.  No clear associated nausea, dyspnea or diaphoresis.  Note she does not have exertional chest pain.  Occasional dyspnea that she attributes to asthma.  She has lost 35 pounds recently.  She has noticed that she has some dizziness with standing and recently had a frank syncopal episode immediately after standing.  Cardiology now asked to evaluate.  Current Outpatient Medications  Medication Sig Dispense Refill   cyclobenzaprine (FLEXERIL) 5 MG tablet Take 1 tablet (5 mg total) by mouth 3 (three) times daily as needed. (Patient not taking: Reported on 07/26/2023) 10 tablet 0   meloxicam (MOBIC) 15 MG tablet Take 1 tablet (15 mg total) by mouth daily. (Patient not taking: Reported on 07/26/2023) 30 tablet 1   pantoprazole (PROTONIX) 40 MG tablet Take 1 tablet (40 mg total) by mouth as needed. (Patient not taking: Reported on 07/26/2023) 90 tablet 1   promethazine (PHENERGAN) 25 MG tablet Take 1 tablet (25 mg total) by mouth every 6 (six) hours as needed for nausea or vomiting. (Patient not taking: Reported on 07/26/2023) 10 tablet 1   No current facility-administered medications for this visit.    Allergies  Allergen Reactions   Ciprofloxacin Hives     Past Medical History:  Diagnosis Date    Anemia    Anxiety    Arthritis    knees   Asthma    Basal cell carcinoma of neck    "front of my neck; it was a melanoma"   Bipolar disorder (HCC)    COPD (chronic obstructive pulmonary disease) (HCC)    Daily headache    Depression    Family history of adverse reaction to anesthesia    MOM-HARD TO WAKE UP   Fibromyalgia    GERD (gastroesophageal reflux disease)    Heart murmur    H/O   History of hiatal hernia    SMALL   Intractable nausea and vomiting    Migraine    "probably twice/month" (09/19/2015)   Obesity    PTSD (post-traumatic stress disorder)    Thyroid disease     Past Surgical History:  Procedure Laterality Date   ABDOMINAL HYSTERECTOMY     CYSTOCELE REPAIR N/A 09/24/2020   Procedure: ANTERIOR REPAIR (CYSTOCELE);  Surgeon: Nadara Mustard, MD;  Location: [facility];  Service: Gynecology;  Laterality: N/A;   MOLE REMOVAL  ~ 2013   "front side of my neck"   MULTIPLE TOOTH EXTRACTIONS  ~ 2011   VAGINAL HYSTERECTOMY Bilateral 09/24/2020   Procedure: HYSTERECTOMY VAGINAL BILATERAL SALPINGO-OOPHERECTOMY;  Surgeon: Nadara Mustard, MD;  Location: [facility];  Service: Gynecology;  Laterality: Bilateral;    Social History   Socioeconomic History   Marital status: Divorced    Spouse name: Not on file   Number of children: 2  Years of education: Not on file   Highest education level: Not on file  Occupational History   Not on file  Tobacco Use   Smoking status: Former    Current packs/day: 0.00    Average packs/day: 0.5 packs/day for 28.0 years (14.0 ttl pk-yrs)    Types: Cigarettes    Start date: 09/17/1985    Quit date: 09/17/2013    Years since quitting: 9.8   Smokeless tobacco: Never   Tobacco comments:    "quit smoking ~ 2014"  Vaping Use   Vaping status: Never Used  Substance and Sexual Activity   Alcohol use: No   Drug use: Not Currently    Types: Marijuana    Comment: 09/19/2015 "probably twice/yr":   Sexual activity: Not Currently  Other  Topics Concern   Not on file  Social History Narrative   Not on file   Social Determinants of Health   Financial Resource Strain: Not on file  Food Insecurity: Not on file  Transportation Needs: Not on file  Physical Activity: Not on file  Stress: Not on file  Social Connections: Not on file  Intimate Partner Violence: Not on file    Family History  Problem Relation Age of Onset   Heart attack Mother    CAD Mother    Breast cancer Sister    Hypertension Other        sibling   Diabetes Other        sibling   Colon cancer Neg Hx    Colon polyps Neg Hx    Esophageal cancer Neg Hx    Rectal cancer Neg Hx    Stomach cancer Neg Hx     ROS: no fevers or chills, productive cough, hemoptysis, dysphasia, odynophagia, melena, hematochezia, dysuria, hematuria, rash, seizure activity, orthopnea, PND, pedal edema, claudication. Remaining systems are negative.  Physical Exam:   Pulse (!) 50, height 5\' 6"  (1.676 m), weight 208 lb (94.3 kg), last menstrual period 12/16/2011, SpO2 97%.  General:  Well developed/well nourished in NAD Skin warm/dry Patient not depressed No peripheral clubbing Back-normal HEENT-normal/normal eyelids Neck supple/normal carotid upstroke bilaterally; no bruits; no JVD; no thyromegaly chest - CTA/ normal expansion CV - RRR/normal S1 and S2; no murmurs, rubs or gallops;  PMI nondisplaced Abdomen -NT/ND, no HSM, no mass, + bowel sounds, no bruit 2+ femoral pulses, no bruits Ext-no edema, chords, 2+ DP Neuro-grossly nonfocal  ECG -June 25, 2023-sinus bradycardia with no ST changes.  Personally reviewed  EKG Interpretation Date/Time:  Monday July 26 2023 10:38:31 EDT Ventricular Rate:  50 PR Interval:  130 QRS Duration:  100 QT Interval:  458 QTC Calculation: 417 R Axis:   27  Text Interpretation: Sinus bradycardia When compared with ECG of 25-Jun-2023 14:50, No significant change was found Confirmed by Olga Millers ([id number]) on 07/26/2023  10:48:15 AM    A/P  1 chest pain-symptoms are atypical and likely related to fibromyalgia or musculoskeletal pain.  She does not have exertional symptoms.  Electrocardiogram is normal.  We discussed further workup including cardiac CTA but for now we will observe.  She is in agreement.  2 recent syncopal episode-this occurred immediately after standing is likely orthostatic mediated.  We discussed the importance of maintaining hydration.  We will arrange an echocardiogram to assess LV function.  If normal we will not pursue further cardiac evaluation.  3 fibromyalgia-follow-up primary care.  Olga Millers, MD

## 2023-07-23 ENCOUNTER — Ambulatory Visit: Payer: Medicaid Other

## 2023-07-26 ENCOUNTER — Encounter: Payer: Self-pay | Admitting: Cardiology

## 2023-07-26 ENCOUNTER — Ambulatory Visit: Payer: Medicaid Other | Attending: Cardiology | Admitting: Cardiology

## 2023-07-26 VITALS — HR 50 | Ht 66.0 in | Wt 208.0 lb

## 2023-07-26 DIAGNOSIS — R55 Syncope and collapse: Secondary | ICD-10-CM

## 2023-07-26 DIAGNOSIS — R079 Chest pain, unspecified: Secondary | ICD-10-CM | POA: Diagnosis not present

## 2023-07-26 DIAGNOSIS — R0602 Shortness of breath: Secondary | ICD-10-CM

## 2023-07-26 DIAGNOSIS — R42 Dizziness and giddiness: Secondary | ICD-10-CM

## 2023-07-26 NOTE — Patient Instructions (Addendum)
Medication Instructions:  *If you need a refill on your cardiac medications before your next appointment, please call your pharmacy*   Lab Work: If you have labs (blood work) drawn today and your tests are completely normal, you will receive your results only by: MyChart Message (if you have MyChart) OR A paper copy in the mail If you have any lab test that is abnormal or we need to change your treatment, we will call you to review the results.   Testing/Procedures: Your physician has requested that you have an echocardiogram. Echocardiography is a painless test that uses sound waves to create images of your heart. It provides your doctor with information about the size and shape of your heart and how well your heart's chambers and valves are working. This procedure takes approximately one hour. There are no restrictions for this procedure. Please do NOT wear perfume, lotions (deodorant is allowed). Please arrive 15 minutes prior to your appointment time.    Follow-Up: At [facility], you and your health needs are our priority.  As part of our continuing mission to provide you with exceptional heart care, we have created designated Provider Care Teams.  These Care Teams include your primary Cardiologist (physician) and Advanced Practice Providers (APPs -  Physician Assistants and Nurse Practitioners) who all work together to provide you with the care you need, when you need it.  We recommend signing up for the patient portal called "MyChart".  Sign up information is provided on this After Visit Summary.  MyChart is used to connect with patients for Virtual Visits (Telemedicine).  Patients are able to view lab/test results, encounter notes, upcoming appointments, etc.  Non-urgent messages can be sent to your provider as well.   To learn more about what you can do with MyChart, go to [email or web address].    Your next appointment:   As needed  Provider:   Olga Millers, MD

## 2023-08-01 DIAGNOSIS — Z419 Encounter for procedure for purposes other than remedying health state, unspecified: Secondary | ICD-10-CM | POA: Diagnosis not present

## 2023-08-09 DIAGNOSIS — M1712 Unilateral primary osteoarthritis, left knee: Secondary | ICD-10-CM

## 2023-08-12 ENCOUNTER — Ambulatory Visit (HOSPITAL_COMMUNITY): Payer: Medicaid Other

## 2023-08-16 ENCOUNTER — Ambulatory Visit: Payer: Medicaid Other

## 2023-08-20 NOTE — Pre-Procedure Instructions (Signed)
Surgical Instructions   Your procedure is scheduled on September 06, 2023. Report to [facility] Main Entrance "A" at 5:30 A.M., then check in with the Admitting office. Any questions or running late day of surgery: call [phone]  Questions prior to your surgery date: call [phone], Monday-Friday, 8am-4pm. If you experience any cold or flu symptoms such as cough, fever, chills, shortness of breath, etc. between now and your scheduled surgery, please notify us at the above number.     Remember:  Do not eat after midnight the night before your surgery  You may drink clear liquids until 4:15 AM the morning of your surgery.   Clear liquids allowed are: Water, Non-Citrus Juices (without pulp), Carbonated Beverages, Clear Tea, Black Coffee Only (NO MILK, CREAM OR POWDERED CREAMER of any kind), and Gatorade.  Patient Instructions  The night before surgery:  No food after midnight. ONLY clear liquids after midnight  The day of surgery (if you do NOT have diabetes):  Drink ONE (1) Pre-Surgery Clear Ensure by 4:15 AM the morning of surgery. Drink in one sitting. Do not sip.  This drink was given to you during your hospital  pre-op appointment visit.  Nothing else to drink after completing the  Pre-Surgery Clear Ensure.         If you have questions, please contact your surgeon's office.     Take these medicines the morning of surgery with A SIP OF WATER: acetaminophen (TYLENOL) - may take if needed   One week prior to surgery, STOP taking any Aspirin (unless otherwise instructed by your surgeon) Aleve, Naproxen, Ibuprofen, Motrin, Advil, Goody's, BC's, all herbal medications, fish oil, and non-prescription vitamins.                     Do NOT Smoke (Tobacco/Vaping) for 24 hours prior to your procedure.  If you use a CPAP at night, you may bring your mask/headgear for your overnight stay.   You will be asked to remove any contacts, glasses, piercing's, hearing aid's,  dentures/partials prior to surgery. Please bring cases for these items if needed.    Patients discharged the day of surgery will not be allowed to drive home, and someone needs to stay with them for 24 hours.  SURGICAL WAITING ROOM VISITATION Patients may have no more than 2 support people in the waiting area - these visitors may rotate.   Pre-op nurse will coordinate an appropriate time for 1 ADULT support person, who may not rotate, to accompany patient in pre-op.  Children under the age of 5 must have an adult with them who is not the patient and must remain in the main waiting area with an adult.  If the patient needs to stay at the hospital during part of their recovery, the visitor guidelines for inpatient rooms apply.  Please refer to the [facility] website for the visitor guidelines for any additional information.   If you received a COVID test during your pre-op visit  it is requested that you wear a mask when out in public, stay away from anyone that may not be feeling well and notify your surgeon if you develop symptoms. If you have been in contact with anyone that has tested positive in the last 10 days please notify you surgeon.      Pre-operative 5 CHG Bathing Instructions   You can play a key role in reducing the risk of infection after surgery. Your skin needs to be as free of germs  as possible. You can reduce the number of germs on your skin by washing with CHG (chlorhexidine gluconate) soap before surgery. CHG is an antiseptic soap that kills germs and continues to kill germs even after washing.   DO NOT use if you have an allergy to chlorhexidine/CHG or antibacterial soaps. If your skin becomes reddened or irritated, stop using the CHG and notify one of our RNs at [phone].   Please shower with the CHG soap starting 4 days before surgery using the following schedule:     Please keep in mind the following:  DO NOT shave, including legs and underarms, starting the  day of your first shower.   You may shave your face at any point before/day of surgery.  Place clean sheets on your bed the day you start using CHG soap. Use a clean washcloth (not used since being washed) for each shower. DO NOT sleep with pets once you start using the CHG.   CHG Shower Instructions:  Wash your face and private area with normal soap. If you choose to wash your hair, wash first with your normal shampoo.  After you use shampoo/soap, rinse your hair and body thoroughly to remove shampoo/soap residue.  Turn the water OFF and apply about 3 tablespoons (45 ml) of CHG soap to a CLEAN washcloth.  Apply CHG soap ONLY FROM YOUR NECK DOWN TO YOUR TOES (washing for 3-5 minutes)  DO NOT use CHG soap on face, private areas, open wounds, or sores.  Pay special attention to the area where your surgery is being performed.  If you are having back surgery, having someone wash your back for you may be helpful. Wait 2 minutes after CHG soap is applied, then you may rinse off the CHG soap.  Pat dry with a clean towel  Put on clean clothes/pajamas   If you choose to wear lotion, please use ONLY the CHG-compatible lotions on the back of this paper.   Additional instructions for the day of surgery: DO NOT APPLY any lotions, deodorants, cologne, or perfumes.   Do not bring valuables to the hospital. [facility] is not responsible for any belongings/valuables. Do not wear nail polish, gel polish, artificial nails, or any other type of covering on natural nails (fingers and toes) Do not wear jewelry or makeup Put on clean/comfortable clothes.  Please brush your teeth.  Ask your nurse before applying any prescription medications to the skin.     CHG Compatible Lotions   Aveeno Moisturizing lotion  Cetaphil Moisturizing Cream  Cetaphil Moisturizing Lotion  Clairol Herbal Essence Moisturizing Lotion, Dry Skin  Clairol Herbal Essence Moisturizing Lotion, Extra Dry Skin  Clairol Herbal Essence  Moisturizing Lotion, Normal Skin  Curel Age Defying Therapeutic Moisturizing Lotion with Alpha Hydroxy  Curel Extreme Care Body Lotion  Curel Soothing Hands Moisturizing Hand Lotion  Curel Therapeutic Moisturizing Cream, Fragrance-Free  Curel Therapeutic Moisturizing Lotion, Fragrance-Free  Curel Therapeutic Moisturizing Lotion, Original Formula  Eucerin Daily Replenishing Lotion  Eucerin Dry Skin Therapy Plus Alpha Hydroxy Crme  Eucerin Dry Skin Therapy Plus Alpha Hydroxy Lotion  Eucerin Original Crme  Eucerin Original Lotion  Eucerin Plus Crme Eucerin Plus Lotion  Eucerin TriLipid Replenishing Lotion  Keri Anti-Bacterial Hand Lotion  Keri Deep Conditioning Original Lotion Dry Skin Formula Softly Scented  Keri Deep Conditioning Original Lotion, Fragrance Free Sensitive Skin Formula  Keri Lotion Fast Absorbing Fragrance Free Sensitive Skin Formula  Keri Lotion Fast Absorbing Softly Scented Dry Skin Formula  Keri Original Lotion  Keri Skin Renewal Lotion Keri Silky Smooth Lotion  Keri Silky Smooth Sensitive Skin Lotion  Nivea Body Creamy Conditioning Oil  Nivea Body Extra Enriched Teacher, adult education Moisturizing Lotion Nivea Crme  Nivea Skin Firming Lotion  NutraDerm 30 Skin Lotion  NutraDerm Skin Lotion  NutraDerm Therapeutic Skin Cream  NutraDerm Therapeutic Skin Lotion  ProShield Protective Hand Cream  Provon moisturizing lotion  Please read over the following fact sheets that you were given.

## 2023-08-23 ENCOUNTER — Encounter (HOSPITAL_COMMUNITY): Payer: Self-pay

## 2023-08-23 ENCOUNTER — Encounter (HOSPITAL_COMMUNITY)
Admission: RE | Admit: 2023-08-23 | Discharge: 2023-08-23 | Disposition: A | Discharge status: Home / Self Care | Payer: Medicaid Other | Source: Ambulatory Visit | Attending: Orthopaedic Surgery | Admitting: Orthopaedic Surgery

## 2023-08-23 ENCOUNTER — Other Ambulatory Visit: Payer: Self-pay

## 2023-08-23 VITALS — BP 113/69 | HR 53 | Temp 98.2°F | Resp 17 | Ht 66.0 in | Wt 207.2 lb

## 2023-08-23 DIAGNOSIS — M1712 Unilateral primary osteoarthritis, left knee: Secondary | ICD-10-CM | POA: Insufficient documentation

## 2023-08-23 DIAGNOSIS — Z01818 Encounter for other preprocedural examination: Secondary | ICD-10-CM

## 2023-08-23 DIAGNOSIS — Z01812 Encounter for preprocedural laboratory examination: Secondary | ICD-10-CM | POA: Diagnosis not present

## 2023-08-23 LAB — BASIC METABOLIC PANEL
Anion gap: 10 (ref 5–15)
BUN: 9 mg/dL (ref 6–20)
CO2: 25 mmol/L (ref 22–32)
Calcium: 9.4 mg/dL (ref 8.9–10.3)
Chloride: 105 mmol/L (ref 98–111)
Creatinine, Ser: 1.1 mg/dL — ABNORMAL HIGH (ref 0.44–1.00)
GFR, Estimated: 59 mL/min — ABNORMAL LOW (ref >60)
Glucose, Bld: 94 mg/dL (ref 70–99)
Potassium: 4.3 mmol/L (ref 3.5–5.1)
Sodium: 140 mmol/L (ref 135–145)

## 2023-08-23 LAB — CBC
HCT: 42.6 % (ref 36.0–46.0)
Hemoglobin: 13.7 g/dL (ref 12.0–15.0)
MCH: 31.6 pg (ref 26.0–34.0)
MCHC: 32.2 g/dL (ref 30.0–36.0)
MCV: 98.4 fL (ref 80.0–100.0)
Platelets: 231 10*3/uL (ref 150–400)
RBC: 4.33 MIL/uL (ref 3.87–5.11)
RDW: 13.9 % (ref 11.5–15.5)
WBC: 11.7 10*3/uL — ABNORMAL HIGH (ref 4.0–10.5)
nRBC: 0 % (ref 0.0–0.2)

## 2023-08-23 LAB — SURGICAL PCR SCREEN
MRSA, PCR: NEGATIVE
Staphylococcus aureus: NEGATIVE

## 2023-08-23 NOTE — Progress Notes (Signed)
PCP - Lynnea Ferrier Cardiologist -  Donato Schultz  PPM/ICD - denies Device Orders - n/a Rep Notified - n/a  Chest x-ray - 06-25-23 EKG - 07-26-23 Stress Test -  ECHO - 08-25-23 Cardiac Cath -   Sleep Study - denies CPAP - n/a  DM denies  Blood Thinner Instructions:denies Aspirin Instructions:n/a  ERAS Protcol - clear liquids until 4:15 PRE-SURGERY Ensure or G2-   COVID TEST- n/a   Anesthesia review: yes to review echo results from 08-25-23  Patient denies shortness of breath, fever, cough and chest pain at PAT appointment   All instructions explained to the patient, with a verbal understanding of the material. Patient agrees to go over the instructions while at home for a better understanding. Patient also instructed to self quarantine after being tested for COVID-19. The opportunity to ask questions was provided.

## 2023-08-24 ENCOUNTER — Encounter: Payer: Medicaid Other | Admitting: Physician Assistant

## 2023-08-24 NOTE — Progress Notes (Signed)
Anesthesia Chart Review:  Pt had recent cardiology evaluation by Dr. Anne Fu on 07/26/23 for atypical chest pain. Per note, "chest pain-symptoms are atypical and likely related to fibromyalgia or musculoskeletal pain.  She does not have exertional symptoms.  Electrocardiogram is normal.  We discussed further workup including cardiac CTA but for now we will observe.  She is in agreement." She was also noted to have had recent syncopal episode felt to be orthostatic mediated. Echo was ordered to assess LV function. Echo scheduled for 08/25/23.  Preop labs reviewed, creatinine mildly elevated 1.10, WBC mildly elevated 11.7, otherwise unremarkable.

## 2023-08-25 ENCOUNTER — Ambulatory Visit (HOSPITAL_COMMUNITY): Payer: Medicaid Other | Attending: Cardiology

## 2023-08-25 DIAGNOSIS — R55 Syncope and collapse: Secondary | ICD-10-CM | POA: Diagnosis not present

## 2023-08-25 LAB — ECHOCARDIOGRAM COMPLETE
Area-P 1/2: 3.12 cm2
S' Lateral: 3.4 cm

## 2023-08-25 NOTE — Anesthesia Preprocedure Evaluation (Addendum)
Anesthesia Evaluation  Patient identified by MRN, date of birth, ID band Patient awake    Reviewed: Allergy & Precautions, NPO status , Patient's Chart, lab work & pertinent test results  History of Anesthesia Complications Negative for: history of anesthetic complications  Airway Mallampati: II  TM Distance: >3 FB Neck ROM: Full    Dental  (+) Edentulous Lower, Edentulous Upper   Pulmonary COPD, Patient abstained from smoking., former smoker   Pulmonary exam normal        Cardiovascular Normal cardiovascular exam  Echo 08/25/23: EF 60-65%, normal valves   Neuro/Psych  Headaches  Anxiety Depression Bipolar Disorder      GI/Hepatic Neg liver ROS, hiatal hernia,GERD  Medicated,,  Endo/Other  negative endocrine ROS    Renal/GU negative Renal ROS     Musculoskeletal  (+) Arthritis ,  Fibromyalgia -  Abdominal   Peds  Hematology negative hematology ROS (+)   Anesthesia Other Findings Day of surgery medications reviewed with patient.  Reproductive/Obstetrics                              Anesthesia Physical Anesthesia Plan  ASA: 2  Anesthesia Plan: Spinal   Post-op Pain Management:  Regional for Post-op pain and Tylenol PO (pre-op)* and Regional block*   Induction:   PONV Risk Score and Plan: 3 and Treatment may vary due to age or medical condition, Ondansetron, Propofol infusion, Dexamethasone and Midazolam  Airway Management Planned: Natural Airway and Simple Face Mask  Additional Equipment: None  Intra-op Plan:   Post-operative Plan:   Informed Consent: I have reviewed the patients History and Physical, chart, labs and discussed the procedure including the risks, benefits and alternatives for the proposed anesthesia with the patient or authorized representative who has indicated his/her understanding and acceptance.       Plan Discussed with: CRNA  Anesthesia Plan Comments:  (PAT note by Antionette Poles, PA-C: Pt had recent cardiology evaluation by Dr. Anne Fu on 07/26/23 for atypical chest pain. Per note, "Chest pain-symptoms are atypical and likely related to fibromyalgia or musculoskeletal pain.  She does not have exertional symptoms.  Electrocardiogram is normal.  We discussed further workup including cardiac CTA but for now we will observe.  She is in agreement." She was also noted to have had recent syncopal episode felt to be orthostatic mediated. Echo was ordered to assess LV function.   Echo 08/25/23 showed EF 60-65%, normal valves.   History of GERD and small hiatal hernia.  Maintained on pantoprazole.  Former smoker, 14 pack years, quit 2014, with associated COPD.  Not on any daily inhaled medications.  Preop labs reviewed, creatinine mildly elevated 1.10, WBC mildly elevated 11.7, otherwise unremarkable.   EKG 07/26/23: Sinus bradycardia. Rate 50.   TTE 08/25/23:  1. Left ventricular ejection fraction, by estimation, is 60 to 65%. The  left ventricle has normal function. The left ventricle has no regional  wall motion abnormalities. There is mild concentric left ventricular  hypertrophy. Left ventricular diastolic  parameters were normal.   2. Right ventricular systolic function is normal. The right ventricular  size is normal.   3. The mitral valve is normal in structure. No evidence of mitral valve  regurgitation. No evidence of mitral stenosis.   4. The aortic valve is normal in structure. Aortic valve regurgitation is  not visualized. No aortic stenosis is present.   5. The inferior vena cava is normal in size with  greater than 50%  respiratory variability, suggesting right atrial pressure of 3 mmHg.   )         Anesthesia Quick Evaluation

## 2023-08-31 ENCOUNTER — Other Ambulatory Visit: Payer: Self-pay | Admitting: Physician Assistant

## 2023-08-31 ENCOUNTER — Other Ambulatory Visit: Payer: Self-pay

## 2023-08-31 MED ORDER — ONDANSETRON HCL 4 MG PO TABS
4.0000 mg | ORAL_TABLET | Freq: Three times a day (TID) | ORAL | 0 refills | Status: DC | PRN
  Filled 2023-08-31: qty 40, 14d supply, fill #0

## 2023-08-31 MED ORDER — ASPIRIN 81 MG PO TBEC
81.0000 mg | DELAYED_RELEASE_TABLET | Freq: Two times a day (BID) | ORAL | 0 refills | Status: DC
  Filled 2023-08-31: qty 120, 60d supply, fill #0

## 2023-08-31 MED ORDER — METHOCARBAMOL 750 MG PO TABS
750.0000 mg | ORAL_TABLET | Freq: Two times a day (BID) | ORAL | 2 refills | Status: DC | PRN
  Filled 2023-08-31: qty 20, 10d supply, fill #0

## 2023-08-31 MED ORDER — DOCUSATE SODIUM 100 MG PO CAPS
100.0000 mg | ORAL_CAPSULE | Freq: Every day | ORAL | 2 refills | Status: DC | PRN
  Filled 2023-08-31: qty 100, 100d supply, fill #0

## 2023-08-31 MED ORDER — OXYCODONE-ACETAMINOPHEN 5-325 MG PO TABS
1.0000 | ORAL_TABLET | Freq: Four times a day (QID) | ORAL | 0 refills | Status: DC | PRN
  Filled 2023-08-31: qty 40, 5d supply, fill #0

## 2023-09-02 ENCOUNTER — Other Ambulatory Visit: Payer: Self-pay

## 2023-09-03 MED ORDER — TRANEXAMIC ACID 1000 MG/10ML IV SOLN
2000.0000 mg | INTRAVENOUS | Status: AC
  Administered 2023-09-06: 1000 mg via TOPICAL
  Filled 2023-09-03: qty 20

## 2023-09-05 DIAGNOSIS — M1712 Unilateral primary osteoarthritis, left knee: Secondary | ICD-10-CM | POA: Insufficient documentation

## 2023-09-06 ENCOUNTER — Observation Stay (HOSPITAL_COMMUNITY)
Admission: RE | Admit: 2023-09-06 | Discharge: 2023-09-07 | Disposition: A | Discharge status: Home Health | Payer: Medicaid Other | Attending: Orthopaedic Surgery | Admitting: Orthopaedic Surgery

## 2023-09-06 ENCOUNTER — Ambulatory Visit (HOSPITAL_COMMUNITY): Payer: Medicaid Other | Admitting: Physician Assistant

## 2023-09-06 ENCOUNTER — Ambulatory Visit (HOSPITAL_BASED_OUTPATIENT_CLINIC_OR_DEPARTMENT_OTHER): Payer: Medicaid Other | Admitting: Anesthesiology

## 2023-09-06 ENCOUNTER — Other Ambulatory Visit: Payer: Self-pay

## 2023-09-06 ENCOUNTER — Encounter (HOSPITAL_COMMUNITY)
Admission: RE | Disposition: A | Discharge status: Home Health | Payer: Self-pay | Source: Home / Self Care | Attending: Orthopaedic Surgery

## 2023-09-06 ENCOUNTER — Observation Stay (HOSPITAL_COMMUNITY): Payer: Medicaid Other

## 2023-09-06 DIAGNOSIS — Z79899 Other long term (current) drug therapy: Secondary | ICD-10-CM | POA: Insufficient documentation

## 2023-09-06 DIAGNOSIS — Z7982 Long term (current) use of aspirin: Secondary | ICD-10-CM | POA: Diagnosis not present

## 2023-09-06 DIAGNOSIS — M1712 Unilateral primary osteoarthritis, left knee: Secondary | ICD-10-CM

## 2023-09-06 DIAGNOSIS — Z87891 Personal history of nicotine dependence: Secondary | ICD-10-CM | POA: Insufficient documentation

## 2023-09-06 DIAGNOSIS — J449 Chronic obstructive pulmonary disease, unspecified: Secondary | ICD-10-CM | POA: Diagnosis not present

## 2023-09-06 DIAGNOSIS — Z85828 Personal history of other malignant neoplasm of skin: Secondary | ICD-10-CM | POA: Insufficient documentation

## 2023-09-06 DIAGNOSIS — Z96652 Presence of left artificial knee joint: Secondary | ICD-10-CM

## 2023-09-06 DIAGNOSIS — J45909 Unspecified asthma, uncomplicated: Secondary | ICD-10-CM | POA: Insufficient documentation

## 2023-09-06 DIAGNOSIS — R609 Edema, unspecified: Secondary | ICD-10-CM | POA: Diagnosis not present

## 2023-09-06 DIAGNOSIS — M1711 Unilateral primary osteoarthritis, right knee: Secondary | ICD-10-CM | POA: Diagnosis not present

## 2023-09-06 DIAGNOSIS — G8918 Other acute postprocedural pain: Secondary | ICD-10-CM | POA: Diagnosis not present

## 2023-09-06 HISTORY — PX: TOTAL KNEE ARTHROPLASTY: SHX125

## 2023-09-06 SURGERY — ARTHROPLASTY, KNEE, TOTAL
Anesthesia: Spinal | Site: Knee | Laterality: Left

## 2023-09-06 MED ORDER — DROPERIDOL 2.5 MG/ML IJ SOLN
0.6250 mg | Freq: Once | INTRAMUSCULAR | Status: AC | PRN
  Administered 2023-09-06: 0.625 mg via INTRAVENOUS

## 2023-09-06 MED ORDER — ONDANSETRON HCL 4 MG/2ML IJ SOLN
INTRAMUSCULAR | Status: AC
  Filled 2023-09-06: qty 2

## 2023-09-06 MED ORDER — HYDROMORPHONE HCL 1 MG/ML IJ SOLN
0.5000 mg | INTRAMUSCULAR | Status: DC | PRN
  Administered 2023-09-07 (×3): 1 mg via INTRAVENOUS
  Filled 2023-09-06 (×3): qty 1

## 2023-09-06 MED ORDER — ACETAMINOPHEN 325 MG PO TABS: 325.0000 mg | ORAL_TABLET | Freq: Four times a day (QID) | ORAL | Status: DC | PRN

## 2023-09-06 MED ORDER — MENTHOL 3 MG MT LOZG: 1.0000 | LOZENGE | OROMUCOSAL | Status: DC | PRN

## 2023-09-06 MED ORDER — ORAL CARE MOUTH RINSE: 15.0000 mL | Freq: Once | OROMUCOSAL | Status: AC

## 2023-09-06 MED ORDER — KETOROLAC TROMETHAMINE 15 MG/ML IJ SOLN
INTRAMUSCULAR | Status: AC
  Administered 2023-09-06: 15 mg via INTRAVENOUS
  Filled 2023-09-06: qty 1

## 2023-09-06 MED ORDER — ASPIRIN 81 MG PO CHEW
81.0000 mg | CHEWABLE_TABLET | Freq: Two times a day (BID) | ORAL | Status: DC
  Administered 2023-09-06 – 2023-09-07 (×2): 81 mg via ORAL
  Filled 2023-09-06 (×2): qty 1

## 2023-09-06 MED ORDER — STERILE WATER FOR IRRIGATION IR SOLN
Status: DC | PRN
Start: 2023-09-06 — End: 2023-09-06
  Administered 2023-09-06: 1000 mL

## 2023-09-06 MED ORDER — TRANEXAMIC ACID-NACL 1000-0.7 MG/100ML-% IV SOLN
INTRAVENOUS | Status: AC
  Filled 2023-09-06: qty 100

## 2023-09-06 MED ORDER — LACTATED RINGERS IV SOLN: INTRAVENOUS | Status: DC

## 2023-09-06 MED ORDER — CHLORHEXIDINE GLUCONATE 0.12 % MT SOLN
15.0000 mL | Freq: Once | OROMUCOSAL | Status: AC
  Administered 2023-09-06: 15 mL via OROMUCOSAL
  Filled 2023-09-06: qty 15

## 2023-09-06 MED ORDER — METOCLOPRAMIDE HCL 5 MG/ML IJ SOLN
5.0000 mg | Freq: Three times a day (TID) | INTRAMUSCULAR | Status: DC | PRN
  Administered 2023-09-06: 10 mg via INTRAVENOUS
  Filled 2023-09-06: qty 2

## 2023-09-06 MED ORDER — DOCUSATE SODIUM 100 MG PO CAPS
100.0000 mg | ORAL_CAPSULE | Freq: Two times a day (BID) | ORAL | Status: DC
  Administered 2023-09-06 – 2023-09-07 (×2): 100 mg via ORAL
  Filled 2023-09-06 (×3): qty 1

## 2023-09-06 MED ORDER — VANCOMYCIN HCL 1000 MG IV SOLR
INTRAVENOUS | Status: AC
  Filled 2023-09-06: qty 20

## 2023-09-06 MED ORDER — MIDAZOLAM HCL 2 MG/2ML IJ SOLN
INTRAMUSCULAR | Status: AC
  Filled 2023-09-06: qty 2

## 2023-09-06 MED ORDER — ACETAMINOPHEN 500 MG PO TABS
1000.0000 mg | ORAL_TABLET | Freq: Four times a day (QID) | ORAL | Status: DC
  Administered 2023-09-06 – 2023-09-07 (×2): 1000 mg via ORAL
  Filled 2023-09-06 (×3): qty 2

## 2023-09-06 MED ORDER — FERROUS SULFATE 325 (65 FE) MG PO TABS
325.0000 mg | ORAL_TABLET | Freq: Three times a day (TID) | ORAL | Status: DC
  Administered 2023-09-06 – 2023-09-07 (×2): 325 mg via ORAL
  Filled 2023-09-06 (×4): qty 1

## 2023-09-06 MED ORDER — PROPOFOL 1000 MG/100ML IV EMUL
INTRAVENOUS | Status: AC
  Filled 2023-09-06: qty 100

## 2023-09-06 MED ORDER — BUPIVACAINE IN DEXTROSE 0.75-8.25 % IT SOLN
INTRATHECAL | Status: DC | PRN
Start: 2023-09-06 — End: 2023-09-06
  Administered 2023-09-06: 1.6 mL via INTRATHECAL

## 2023-09-06 MED ORDER — HYDROMORPHONE HCL 1 MG/ML IJ SOLN
0.2500 mg | INTRAMUSCULAR | Status: DC | PRN
  Administered 2023-09-06: 0.5 mg via INTRAVENOUS

## 2023-09-06 MED ORDER — PRONTOSAN WOUND IRRIGATION OPTIME
TOPICAL | Status: DC | PRN
  Administered 2023-09-06: 350 mL

## 2023-09-06 MED ORDER — PHENOL 1.4 % MT LIQD: 1.0000 | OROMUCOSAL | Status: DC | PRN

## 2023-09-06 MED ORDER — 0.9 % SODIUM CHLORIDE (POUR BTL) OPTIME
TOPICAL | Status: DC | PRN
  Administered 2023-09-06: 1000 mL

## 2023-09-06 MED ORDER — ACETAMINOPHEN 500 MG PO TABS
1000.0000 mg | ORAL_TABLET | Freq: Once | ORAL | Status: AC
  Administered 2023-09-06: 1000 mg via ORAL
  Filled 2023-09-06: qty 2

## 2023-09-06 MED ORDER — DEXAMETHASONE SODIUM PHOSPHATE 10 MG/ML IJ SOLN
10.0000 mg | Freq: Once | INTRAMUSCULAR | Status: AC
  Administered 2023-09-07: 10 mg via INTRAVENOUS
  Filled 2023-09-06: qty 1

## 2023-09-06 MED ORDER — BUPIVACAINE-EPINEPHRINE (PF) 0.5% -1:200000 IJ SOLN
INTRAMUSCULAR | Status: DC | PRN
Start: 2023-09-06 — End: 2023-09-06
  Administered 2023-09-06: 15 mL via PERINEURAL

## 2023-09-06 MED ORDER — CEFAZOLIN SODIUM-DEXTROSE 2-4 GM/100ML-% IV SOLN
2.0000 g | Freq: Four times a day (QID) | INTRAVENOUS | Status: AC
  Administered 2023-09-06 (×2): 2 g via INTRAVENOUS
  Filled 2023-09-06 (×2): qty 100

## 2023-09-06 MED ORDER — METOCLOPRAMIDE HCL 5 MG PO TABS: 5.0000 mg | ORAL_TABLET | Freq: Three times a day (TID) | ORAL | Status: DC | PRN

## 2023-09-06 MED ORDER — OXYCODONE HCL 5 MG PO TABS: 10.0000 mg | ORAL_TABLET | ORAL | Status: DC | PRN

## 2023-09-06 MED ORDER — BUPIVACAINE-MELOXICAM ER 400-12 MG/14ML IJ SOLN
INTRAMUSCULAR | Status: DC | PRN
  Administered 2023-09-06: 14 mL

## 2023-09-06 MED ORDER — SODIUM CHLORIDE 0.9 % IR SOLN
Status: DC | PRN
  Administered 2023-09-06: 1

## 2023-09-06 MED ORDER — ONDANSETRON HCL 4 MG/2ML IJ SOLN
4.0000 mg | Freq: Four times a day (QID) | INTRAMUSCULAR | Status: DC | PRN
  Administered 2023-09-07: 4 mg via INTRAVENOUS
  Filled 2023-09-06: qty 2

## 2023-09-06 MED ORDER — MIDAZOLAM HCL 2 MG/2ML IJ SOLN
INTRAMUSCULAR | Status: DC | PRN
  Administered 2023-09-06: 2 mg via INTRAVENOUS

## 2023-09-06 MED ORDER — METHOCARBAMOL 1000 MG/10ML IJ SOLN
500.0000 mg | Freq: Four times a day (QID) | INTRAVENOUS | Status: DC | PRN
  Administered 2023-09-07: 500 mg via INTRAVENOUS
  Filled 2023-09-06: qty 500

## 2023-09-06 MED ORDER — VANCOMYCIN HCL 1000 MG IV SOLR
INTRAVENOUS | Status: DC | PRN
  Administered 2023-09-06: 1000 mg via TOPICAL

## 2023-09-06 MED ORDER — FENTANYL CITRATE (PF) 250 MCG/5ML IJ SOLN
INTRAMUSCULAR | Status: AC
  Filled 2023-09-06: qty 5

## 2023-09-06 MED ORDER — ONDANSETRON HCL 4 MG PO TABS
4.0000 mg | ORAL_TABLET | Freq: Four times a day (QID) | ORAL | Status: DC | PRN
  Administered 2023-09-06: 4 mg via ORAL
  Filled 2023-09-06: qty 1

## 2023-09-06 MED ORDER — PROPOFOL 500 MG/50ML IV EMUL
INTRAVENOUS | Status: DC | PRN
  Administered 2023-09-06: 100 ug/kg/min via INTRAVENOUS

## 2023-09-06 MED ORDER — PHENYLEPHRINE HCL-NACL 20-0.9 MG/250ML-% IV SOLN
INTRAVENOUS | Status: DC | PRN
  Administered 2023-09-06: 50 ug/min via INTRAVENOUS

## 2023-09-06 MED ORDER — ONDANSETRON HCL 4 MG/2ML IJ SOLN
INTRAMUSCULAR | Status: DC | PRN
  Administered 2023-09-06: 4 mg via INTRAVENOUS

## 2023-09-06 MED ORDER — OXYCODONE HCL 5 MG PO TABS
5.0000 mg | ORAL_TABLET | ORAL | Status: DC | PRN
  Administered 2023-09-06 – 2023-09-07 (×3): 10 mg via ORAL
  Filled 2023-09-06 (×3): qty 2

## 2023-09-06 MED ORDER — DROPERIDOL 2.5 MG/ML IJ SOLN
INTRAMUSCULAR | Status: AC
  Filled 2023-09-06: qty 2

## 2023-09-06 MED ORDER — TRANEXAMIC ACID-NACL 1000-0.7 MG/100ML-% IV SOLN
1000.0000 mg | INTRAVENOUS | Status: AC
  Filled 2023-09-06: qty 100

## 2023-09-06 MED ORDER — BUPIVACAINE-MELOXICAM ER 400-12 MG/14ML IJ SOLN
INTRAMUSCULAR | Status: AC
  Filled 2023-09-06: qty 1

## 2023-09-06 MED ORDER — HYDROMORPHONE HCL 1 MG/ML IJ SOLN
INTRAMUSCULAR | Status: AC
  Administered 2023-09-06: 0.5 mg via INTRAVENOUS
  Filled 2023-09-06: qty 1

## 2023-09-06 MED ORDER — CLONIDINE HCL (ANALGESIA) 100 MCG/ML EP SOLN
EPIDURAL | Status: DC | PRN
Start: 2023-09-06 — End: 2023-09-06
  Administered 2023-09-06: 100 ug

## 2023-09-06 MED ORDER — SODIUM CHLORIDE 0.9 % IV SOLN: INTRAVENOUS | Status: DC

## 2023-09-06 MED ORDER — METHOCARBAMOL 500 MG PO TABS
500.0000 mg | ORAL_TABLET | Freq: Four times a day (QID) | ORAL | Status: DC | PRN
  Administered 2023-09-06: 500 mg via ORAL
  Filled 2023-09-06: qty 1

## 2023-09-06 MED ORDER — FENTANYL CITRATE (PF) 250 MCG/5ML IJ SOLN
INTRAMUSCULAR | Status: DC | PRN
  Administered 2023-09-06 (×2): 50 ug via INTRAVENOUS

## 2023-09-06 MED ORDER — DEXAMETHASONE SODIUM PHOSPHATE 10 MG/ML IJ SOLN
INTRAMUSCULAR | Status: AC
  Filled 2023-09-06: qty 1

## 2023-09-06 MED ORDER — KETOROLAC TROMETHAMINE 15 MG/ML IJ SOLN
15.0000 mg | Freq: Four times a day (QID) | INTRAMUSCULAR | Status: AC
  Administered 2023-09-06 (×3): 15 mg via INTRAVENOUS
  Filled 2023-09-06 (×3): qty 1

## 2023-09-06 MED ORDER — DEXAMETHASONE SODIUM PHOSPHATE 10 MG/ML IJ SOLN
INTRAMUSCULAR | Status: DC | PRN
  Administered 2023-09-06: 5 mg via INTRAVENOUS

## 2023-09-06 MED ORDER — CEFAZOLIN SODIUM-DEXTROSE 2-4 GM/100ML-% IV SOLN
2.0000 g | INTRAVENOUS | Status: AC
  Administered 2023-09-06: 2 g via INTRAVENOUS
  Filled 2023-09-06: qty 100

## 2023-09-06 MED ORDER — TRANEXAMIC ACID-NACL 1000-0.7 MG/100ML-% IV SOLN
1000.0000 mg | Freq: Once | INTRAVENOUS | Status: AC
  Administered 2023-09-06: 1000 mg via INTRAVENOUS
  Filled 2023-09-06: qty 100

## 2023-09-06 SURGICAL SUPPLY — 82 items
ADH SKN CLS APL DERMABOND .7 (GAUZE/BANDAGES/DRESSINGS) ×1
ALCOHOL 70% 16 OZ (MISCELLANEOUS) ×1 IMPLANT
BAG COUNTER SPONGE SURGICOUNT (BAG) IMPLANT
BAG DECANTER FOR FLEXI CONT (MISCELLANEOUS) ×1 IMPLANT
BAG SPNG CNTER NS LX DISP (BAG)
BANDAGE ESMARK 6X9 LF (GAUZE/BANDAGES/DRESSINGS) IMPLANT
BLADE SAG 18X100X1.27 (BLADE) ×1 IMPLANT
BLADE SAW SGTL 73X25 THK (BLADE) ×1 IMPLANT
BNDG CMPR 9X6 STRL LF SNTH (GAUZE/BANDAGES/DRESSINGS)
BNDG ESMARK 6X9 LF (GAUZE/BANDAGES/DRESSINGS)
BOWL SMART MIX CTS (DISPOSABLE) ×1 IMPLANT
CLSR STERI-STRIP ANTIMIC 1/2X4 (GAUZE/BANDAGES/DRESSINGS) ×2 IMPLANT
COMP FEM KNEE STD PS 7 LT (Joint) ×1 IMPLANT
COMP PATELLA PEG 3 32 (Joint) ×1 IMPLANT
COMPONENT FEM KNEE STD PS 7 LT (Joint) IMPLANT
COMPONENT PATELLA PEG 3 32 (Joint) IMPLANT
COOLER ICEMAN CLASSIC (MISCELLANEOUS) ×1 IMPLANT
COVER SURGICAL LIGHT HANDLE (MISCELLANEOUS) ×1 IMPLANT
CUFF TOURN SGL QUICK 34 (TOURNIQUET CUFF) ×1
CUFF TOURN SGL QUICK 42 (TOURNIQUET CUFF) IMPLANT
CUFF TRNQT CYL 34X4.125X (TOURNIQUET CUFF) ×1 IMPLANT
DERMABOND ADVANCED .7 DNX12 (GAUZE/BANDAGES/DRESSINGS) ×1 IMPLANT
DRAPE EXTREMITY T 121X128X90 (DISPOSABLE) ×1 IMPLANT
DRAPE HALF SHEET 40X57 (DRAPES) ×1 IMPLANT
DRAPE INCISE IOBAN 66X45 STRL (DRAPES) ×1 IMPLANT
DRAPE ORTHO SPLIT 77X108 STRL (DRAPES)
DRAPE POUCH INSTRU U-SHP 10X18 (DRAPES) ×1 IMPLANT
DRAPE SURG ORHT 6 SPLT 77X108 (DRAPES) IMPLANT
DRAPE U-SHAPE 47X51 STRL (DRAPES) ×2 IMPLANT
DRSG AQUACEL AG ADV 3.5X10 (GAUZE/BANDAGES/DRESSINGS) ×1 IMPLANT
DURAPREP 26ML APPLICATOR (WOUND CARE) ×3 IMPLANT
ELECT CAUTERY BLADE 6.4 (BLADE) ×1 IMPLANT
ELECT PENCIL ROCKER SW 15FT (MISCELLANEOUS) ×1 IMPLANT
ELECT REM PT RETURN 9FT ADLT (ELECTROSURGICAL) ×1
ELECTRODE REM PT RTRN 9FT ADLT (ELECTROSURGICAL) ×1 IMPLANT
GLOVE BIOGEL PI IND STRL 7.0 (GLOVE) ×2 IMPLANT
GLOVE BIOGEL PI IND STRL 7.5 (GLOVE) ×5 IMPLANT
GLOVE ECLIPSE 7.0 STRL STRAW (GLOVE) ×3 IMPLANT
GLOVE INDICATOR 7.0 STRL GRN (GLOVE) ×1 IMPLANT
GLOVE INDICATOR 7.5 STRL GRN (GLOVE) ×1 IMPLANT
GLOVE SURG SYN 7.5 E (GLOVE) ×2 IMPLANT
GLOVE SURG SYN 7.5 PF PI (GLOVE) ×2 IMPLANT
GLOVE SURG UNDER LTX SZ7.5 (GLOVE) ×2 IMPLANT
GLOVE SURG UNDER POLY LF SZ7 (GLOVE) ×2 IMPLANT
GOWN STRL REUS W/ TWL LRG LVL3 (GOWN DISPOSABLE) ×1 IMPLANT
GOWN STRL REUS W/TWL LRG LVL3 (GOWN DISPOSABLE) ×1
GOWN STRL SURGICAL XL XLNG (GOWN DISPOSABLE) ×1 IMPLANT
GOWN TOGA ZIPPER T7+ PEEL AWAY (MISCELLANEOUS) ×2 IMPLANT
HANDPIECE INTERPULSE COAX TIP (DISPOSABLE) ×1
HOOD PEEL AWAY T7 (MISCELLANEOUS) ×1 IMPLANT
KIT BASIN OR (CUSTOM PROCEDURE TRAY) ×1 IMPLANT
KIT TURNOVER KIT B (KITS) ×1 IMPLANT
MANIFOLD NEPTUNE II (INSTRUMENTS) ×1 IMPLANT
MARKER SKIN DUAL TIP RULER LAB (MISCELLANEOUS) ×2 IMPLANT
NDL SPNL 18GX3.5 QUINCKE PK (NEEDLE) ×1 IMPLANT
NEEDLE SPNL 18GX3.5 QUINCKE PK (NEEDLE) ×1 IMPLANT
NS IRRIG 1000ML POUR BTL (IV SOLUTION) ×1 IMPLANT
PACK TOTAL JOINT (CUSTOM PROCEDURE TRAY) ×1 IMPLANT
PAD ARMBOARD 7.5X6 YLW CONV (MISCELLANEOUS) ×2 IMPLANT
PAD COLD SHLDR WRAP-ON (PAD) ×1 IMPLANT
PIN DRILL HDLS TROCAR 75 4PK (PIN) IMPLANT
SCREW FEMALE HEX FIX 25X2.5 (ORTHOPEDIC DISPOSABLE SUPPLIES) IMPLANT
SET HNDPC FAN SPRY TIP SCT (DISPOSABLE) ×1 IMPLANT
SOLUTION PRONTOSAN WOUND 350ML (IRRIGATION / IRRIGATOR) ×1 IMPLANT
STAPLER VISISTAT 35W (STAPLE) IMPLANT
STEM TIB PERS SZ E 5D LT (Screw) IMPLANT
STEM TIBIAL SZ6-7 EF 12 LT (Knees) IMPLANT
SUCTION TUBE FRAZIER 10FR DISP (SUCTIONS) ×1 IMPLANT
SUT ETHILON 2 0 FS 18 (SUTURE) IMPLANT
SUT MNCRL AB 3-0 PS2 27 (SUTURE) IMPLANT
SUT VIC AB 0 CT1 27 (SUTURE) ×2
SUT VIC AB 0 CT1 27XBRD ANBCTR (SUTURE) ×2 IMPLANT
SUT VIC AB 1 CTX 27 (SUTURE) ×3 IMPLANT
SUT VIC AB 2-0 CT1 27 (SUTURE) ×4
SUT VIC AB 2-0 CT1 TAPERPNT 27 (SUTURE) ×4 IMPLANT
SYR 50ML LL SCALE MARK (SYRINGE) ×2 IMPLANT
TOWEL GREEN STERILE (TOWEL DISPOSABLE) ×1 IMPLANT
TOWEL GREEN STERILE FF (TOWEL DISPOSABLE) ×1 IMPLANT
TRAY CATH INTERMITTENT SS 16FR (CATHETERS) IMPLANT
TUBE SUCT ARGYLE STRL (TUBING) ×1 IMPLANT
UNDERPAD 30X36 HEAVY ABSORB (UNDERPADS AND DIAPERS) ×1 IMPLANT
YANKAUER SUCT BULB TIP NO VENT (SUCTIONS) ×2 IMPLANT

## 2023-09-06 NOTE — H&P (Signed)
PREOPERATIVE H&P  Chief Complaint: left knee osteoarthritis  HPI: Adrienne Garcia is a 57 y.o. female who presents for surgical treatment of left knee osteoarthritis.  She denies any changes in medical history.  Past Surgical History:  Procedure Laterality Date   ABDOMINAL HYSTERECTOMY     CYSTOCELE REPAIR N/A 09/24/2020   Procedure: ANTERIOR REPAIR (CYSTOCELE);  Surgeon: Nadara Mustard, MD;  Location: [facility] OR[facility];  Service: Gynecology;  Laterality: N/A;   MOLE REMOVAL  ~ 2013   "front side of my neck"   MULTIPLE TOOTH EXTRACTIONS  ~ 2011   VAGINAL HYSTERECTOMY Bilateral 09/24/2020   Procedure: HYSTERECTOMY VAGINAL BILATERAL SALPINGO-OOPHERECTOMY;  Surgeon: Nadara Mustard, MD;  Location: [facility];  Service: Gynecology;  Laterality: Bilateral;   Social History   Socioeconomic History   Marital status: Divorced    Spouse name: Not on file   Number of children: 2   Years of education: Not on file   Highest education level: Not on file  Occupational History   Not on file  Tobacco Use   Smoking status: Former    Current packs/day: 0.00    Average packs/day: 0.5 packs/day for 28.0 years (14.0 ttl pk-yrs)    Types: Cigarettes    Start date: 09/17/1985    Quit date: 09/17/2013    Years since quitting: 9.9   Smokeless tobacco: Never   Tobacco comments:    "quit smoking ~ 2014"  Vaping Use   Vaping status: Never Used  Substance and Sexual Activity   Alcohol use: No   Drug use: Not Currently    Types: Marijuana    Comment: 09/19/2015 "probably twice/yr":   Sexual activity: Not Currently  Other Topics Concern   Not on file  Social History Narrative   Not on file   Social Determinants of Health   Financial Resource Strain: Not on file  Food Insecurity: Not on file  Transportation Needs: Not on file  Physical Activity: Not on file  Stress: Not on file  Social Connections: Not on file   Family History  Problem Relation Age of Onset   Heart attack Mother    CAD  Mother    Breast cancer Sister    Hypertension Other        sibling   Diabetes Other        sibling   Colon cancer Neg Hx    Colon polyps Neg Hx    Esophageal cancer Neg Hx    Rectal cancer Neg Hx    Stomach cancer Neg Hx    Allergies  Allergen Reactions   Ciprofloxacin Hives   Prior to Admission medications   Medication Sig Start Date End Date Taking? Authorizing Provider  acetaminophen (TYLENOL) 500 MG tablet Take 1,000 mg by mouth every 6 (six) hours as needed for moderate pain.   Yes [provider]  aspirin EC 81 MG tablet Take 1 tablet (81 mg total) by mouth 2 (two) times daily. To be taken after surgery to prevent blood clots 08/31/23 08/30/24  Cristie Hem, PA-C  cyclobenzaprine (FLEXERIL) 5 MG tablet Take 1 tablet (5 mg total) by mouth 3 (three) times daily as needed. 06/29/23   Donita Brooks, MD  docusate sodium (COLACE) 100 MG capsule Take 1 capsule (100 mg total) by mouth daily as needed. 08/31/23 08/30/24  Cristie Hem, PA-C  meloxicam (MOBIC) 15 MG tablet Take 1 tablet (15 mg total) by mouth daily. 04/02/23   Donita Brooks, MD  methocarbamol (ROBAXIN-750) 750 MG tablet Take 1 tablet (750 mg total) by mouth 2 (two) times daily as needed for muscle spasms. 08/31/23   Cristie Hem, PA-C  ondansetron (ZOFRAN) 4 MG tablet Take 1 tablet (4 mg total) by mouth every 8 (eight) hours as needed for nausea or vomiting. 08/31/23   Cristie Hem, PA-C  oxyCODONE-acetaminophen (PERCOCET) 5-325 MG tablet Take 1-2 tablets by mouth every 6 (six) hours as needed. To be taken after surgery 08/31/23   Cristie Hem, PA-C  pantoprazole (PROTONIX) 40 MG tablet Take 1 tablet (40 mg total) by mouth as needed. 06/29/23   Donita Brooks, MD  promethazine (PHENERGAN) 25 MG tablet Take 1 tablet (25 mg total) by mouth every 6 (six) hours as needed for nausea or vomiting. 06/29/23   Donita Brooks, MD  potassium chloride (K-DUR) 10 MEQ tablet Take 1 tablet (10 mEq total) by  mouth daily. 07/07/17 03/27/20  Audry Pili, PA-C     Positive ROS: All other systems have been reviewed and were otherwise negative with the exception of those mentioned in the HPI and as above.  Physical Exam: General: Alert, no acute distress Cardiovascular: No pedal edema Respiratory: No cyanosis, no use of accessory musculature GI: abdomen soft Skin: No lesions in the area of chief complaint Neurologic: Sensation intact distally Psychiatric: Patient is competent for consent with normal mood and affect Lymphatic: no lymphedema  MUSCULOSKELETAL: exam stable  Assessment: left knee osteoarthritis  Plan: Plan for Procedure(s): LEFT TOTAL KNEE ARTHROPLASTY  The risks benefits and alternatives were discussed with the patient including but not limited to the risks of nonoperative treatment, versus surgical intervention including infection, bleeding, nerve injury,  blood clots, cardiopulmonary complications, morbidity, mortality, among others, and they were willing to proceed.   Glee Arvin, MD 09/06/2023 5:58 AM

## 2023-09-06 NOTE — Transfer of Care (Signed)
Immediate Anesthesia Transfer of Care Note  Patient: Adrienne Garcia  Procedure(s) Performed: LEFT TOTAL KNEE ARTHROPLASTY (Left: Knee)  Patient Location: PACU  Anesthesia Type:Spinal  Level of Consciousness: awake, alert , and oriented  Airway & Oxygen Therapy: Patient Spontanous Breathing  Post-op Assessment: Report given to RN  Post vital signs: Reviewed and stable  Last Vitals:  Vitals Value Taken Time  BP 100/62 09/06/23 0917  Temp    Pulse 39 09/06/23 0922  Resp 11 09/06/23 0922  SpO2 97 % 09/06/23 0922  Vitals shown include unfiled device data.  Last Pain:  Vitals:   09/06/23 0615  TempSrc: Oral  PainSc: 7       Patients Stated Pain Goal: 3 (09/06/23 0615)  Complications: No notable events documented.

## 2023-09-06 NOTE — Anesthesia Procedure Notes (Signed)
Spinal  Patient location during procedure: OR Start time: 09/06/2023 7:30 AM End time: 09/06/2023 7:33 AM Reason for block: surgical anesthesia Staffing Performed: anesthesiologist  Anesthesiologist: Kaylyn Layer, MD Performed by: Kaylyn Layer, MD Authorized by: Kaylyn Layer, MD   Preanesthetic Checklist Completed: patient identified, IV checked, risks and benefits discussed, surgical consent, monitors and equipment checked, pre-op evaluation and timeout performed Spinal Block Patient position: sitting Prep: DuraPrep and site prepped and draped Patient monitoring: continuous pulse ox, blood pressure and heart rate Approach: midline Location: L3-4 Injection technique: single-shot Needle Needle type: Pencan  Needle gauge: 24 G Needle length: 9 cm Assessment Events: CSF return Additional Notes Risks, benefits, and alternative discussed. Patient gave consent to procedure. Prepped and draped in sitting position. Patient sedated but responsive to voice. Clear CSF obtained after 2 attempts. Positive terminal aspiration. No pain or paraesthesias with injection. Patient tolerated procedure well. Vital signs stable. Adrienne Greenhouse, MD

## 2023-09-06 NOTE — Evaluation (Signed)
Occupational Therapy Evaluation Patient Details Name: Adrienne Garcia MRN: [id number] DOB: [birth year] Today's Date: 09/06/2023   History of Present Illness 57 y.o. female presents to [facility] hospital on 09/06/2023 for elective L TKA. PMH includes depression, bipolar affective disorder.   Clinical Impression   Patient admitted for the procedure above.  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  Currently she is needing up to CGA for mobility due to continued decreased safety withy mobility.  Patient needing cues for RW management and sequencing, and for step length.  Having a tendency to step too far out with L leg leading to knee instability.  Generalized supervision for stand grooming and CGA for lower body ADL.  Extended tub bench recommended for home use, but no post acute OT is anticipated.         If plan is discharge home, recommend the following: Assist for transportation;Assistance with cooking/housework;A little help with bathing/dressing/bathroom;A little help with walking and/or transfers    Functional Status Assessment  Patient has had a recent decline in their functional status and demonstrates the ability to make significant improvements in function in a reasonable and predictable amount of time.  Equipment Recommendations  Tub/shower bench    Recommendations for Other Services       Precautions / Restrictions Precautions Precautions: Fall Precaution Comments: impulsive, L knee buckling Restrictions Weight Bearing Restrictions: Yes LLE Weight Bearing: Weight bearing as tolerated      Mobility Bed Mobility Overal bed mobility: Modified Independent                  Transfers Overall transfer level: Needs assistance Equipment used: Rolling walker (2 wheels) Transfers: Sit to/from Stand, Bed to chair/wheelchair/BSC Sit to Stand: Supervision, Contact guard assist     Step pivot  transfers: Contact guard assist            Balance Overall balance assessment: Needs assistance Sitting-balance support: No upper extremity supported, Feet supported Sitting balance-Leahy Scale: Good     Standing balance support: Reliant on assistive device for balance Standing balance-Leahy Scale: Poor                             ADL either performed or assessed with clinical judgement   ADL       Grooming: Wash/dry hands;Supervision/safety;Standing               Lower Body Dressing: Contact guard assist;Sit to/from stand   Toilet Transfer: Ambulance person;Ambulation                   Vision Patient Visual Report: No change from baseline       Perception Perception: Not tested       Praxis Praxis: Not tested       Pertinent Vitals/Pain Pain Assessment Pain Assessment: Faces Faces Pain Scale: Hurts little more Pain Location: L knee Pain Descriptors / Indicators: Aching Pain Intervention(s): Monitored during session     Extremity/Trunk Assessment Upper Extremity Assessment Upper Extremity Assessment: Overall WFL for tasks assessed   Lower Extremity Assessment Lower Extremity Assessment: Defer to PT evaluation LLE Deficits / Details: generalized post-op weakness and ROM deficits expected s/p TKA   Cervical / Trunk Assessment Cervical / Trunk Assessment: Normal   Communication Communication Communication: No apparent difficulties Cueing Techniques: Verbal cues   Cognition Arousal: Alert   Overall Cognitive  Status: Within Functional Limits for tasks assessed                                       General Comments  VSS on RA    Exercises     Shoulder Instructions      Home Living Family/patient expects to be discharged to:: Private residence Living Arrangements: Spouse/significant other Available Help at Discharge: Family;Available 24 hours/day Type of Home: House Home Access:  Stairs to enter [employer] of Steps: 4 Entrance Stairs-Rails: Can reach both Home Layout: One level     Bathroom Shower/Tub: Chief Strategy Officer: Standard     Home Equipment: Agricultural consultant (2 wheels);Cane - single point          Prior Functioning/Environment Prior Level of Function : Independent/Modified Independent                        OT Problem List: Impaired balance (sitting and/or standing);Decreased safety awareness      OT Treatment/Interventions: Self-care/ADL training;Therapeutic activities;Balance training;DME and/or AE instruction    OT Goals(Current goals can be found in the care plan section) Acute Rehab OT Goals Patient Stated Goal: Return home OT Goal Formulation: With patient Time For Goal Achievement: 09/20/23 Potential to Achieve Goals: Good ADL Goals Pt Will Perform Lower Body Dressing: with modified independence;sit to/from stand Pt Will Transfer to Toilet: with modified independence;ambulating;regular height toilet  OT Frequency: Min 1X/week    Co-evaluation              AM-PAC OT "6 Clicks" Daily Activity     Outcome Measure Help from another person eating meals?: None Help from another person taking care of personal grooming?: None Help from another person toileting, which includes using toliet, bedpan, or urinal?: A Little Help from another person bathing (including washing, rinsing, drying)?: A Little Help from another person to put on and taking off regular upper body clothing?: None Help from another person to put on and taking off regular lower body clothing?: A Little 6 Click Score: 21   End of Session Equipment Utilized During Treatment: Gait belt;Rolling walker (2 wheels) Nurse Communication: Mobility status  Activity Tolerance: Patient tolerated treatment well Patient left: in chair;with call bell/phone within reach  OT Visit Diagnosis: Unsteadiness on feet (R26.81);Pain Pain -  Right/Left: Left Pain - part of body: Knee                Time: 1709-1730 OT Time Calculation (min): 21 min Charges:  OT General Charges $OT Visit: 1 Visit OT Evaluation $OT Eval Moderate Complexity: 1 Mod  09/06/2023  RP, OTR/L  Acute Rehabilitation Services  Office:  [phone]   Suzanna Obey 09/06/2023, 5:35 PM

## 2023-09-06 NOTE — Discharge Instructions (Signed)

## 2023-09-06 NOTE — Anesthesia Procedure Notes (Addendum)
Anesthesia Regional Block: Adductor canal block   Pre-Anesthetic Checklist: , timeout performed,  Correct Patient, Correct Site, Correct Laterality,  Correct Procedure, Correct Position, site marked,  Risks and benefits discussed,  Pre-op evaluation,  At surgeon's request and post-op pain management  Laterality: Left  Prep: Maximum Sterile Barrier Precautions used, chloraprep       Needles:  Injection technique: Single-shot  Needle Type: Echogenic Stimulator Needle     Needle Length: 9cm  Needle Gauge: 22     Additional Needles:   Procedures:,,,, ultrasound used (permanent image in chart),,    Narrative:  Start time: 09/06/2023 7:11 AM End time: 09/06/2023 7:14 AM Injection made incrementally with aspirations every 5 mL.  Performed by: Personally  Anesthesiologist: Kaylyn Layer, MD  Additional Notes: Risks, benefits, and alternative discussed. Patient gave consent for procedure. Patient prepped and draped in sterile fashion. Sedation administered, patient remains easily responsive to voice. Relevant anatomy identified with ultrasound guidance. Local anesthetic given in 5cc increments with no signs or symptoms of intravascular injection. No pain or paraesthesias with injection. Patient monitored throughout procedure with signs of LAST or immediate complications. Tolerated well. Ultrasound image placed in chart.  Amalia Greenhouse, MD

## 2023-09-06 NOTE — Op Note (Signed)
Total Knee Arthroplasty Procedure Note  Preoperative diagnosis: Left knee osteoarthritis  Postoperative diagnosis:same  Operative findings: Complete loss of joint space from medial and patellofemoral compartments  Operative procedure: Left total knee arthroplasty. CPT [phone]  Surgeon: N. Glee Arvin, MD  Assist: Oneal Grout, PA-C; necessary for the timely completion of procedure and due to complexity of procedure.  Anesthesia: Spinal, regional  Tourniquet time: 48 mins  Implants used: Zimmer persona press fit Femur: CR 7 Tibia: E Patella: 32 mm Polyethylene: 12 mm medial congruent  Indication: Adrienne Garcia is a 57 y.o. year old female with a history of knee pain. Having failed conservative management, the patient elected to proceed with a total knee arthroplasty.  We have reviewed the risk and benefits of the surgery and they elected to proceed after voicing understanding.  Procedure:  After informed consent was obtained and understanding of the risk were voiced including but not limited to bleeding, infection, damage to surrounding structures including nerves and vessels, blood clots, leg length inequality and the failure to achieve desired results, the operative extremity was marked with verbal confirmation of the patient in the holding area.   The patient was then brought to the operating room and transported to the operating room table in the supine position.  A tourniquet was applied to the operative extremity around the upper thigh. The operative limb was then prepped and draped in the usual sterile fashion and preoperative antibiotics were administered.  A time out was performed prior to the start of surgery confirming the correct extremity, preoperative antibiotic administration, as well as team members, implants and instruments available for the case. Correct surgical site was also confirmed with preoperative radiographs. The limb was then elevated for  exsanguination and the tourniquet was inflated. A midline incision was made and a standard medial parapatellar approach was performed.  The patella was everted which showed complete loss of articular cartilage.  The patella was prepared and sized to a 32 mm.  A cover was placed on the patella for protection from retractors.  We then turned our attention to the femur. Posterior cruciate ligament was sacrificed. Start site was drilled in the femur and the intramedullary distal femoral cutting guide was placed, set at 5 degrees valgus, taking 10 mm of distal resection. The distal cut was made. Osteophytes were then removed.   Next, the proximal tibial cutting guide was placed with appropriate slope, varus/valgus alignment and depth of resection. A drop rod was attached to confirm that it was pointed to the second metatarsal.  The proximal tibial cut was made taking 2 mm off the low side. Gap blocks were then used to assess the extension gap and alignment, and appropriate soft tissue releases were performed. Attention was turned back to the femur, which was sized using the sizing guide to a size 7. Appropriate rotation of the femoral component was determined using epicondylar axis, Whiteside's line, and assessing the flexion gap under ligament tension. The appropriate size 4-in-1 cutting block was placed and cuts were made.  Posterior femoral osteophytes and uncapped bone were then removed with the curved osteotome.  Trial components were placed, and stability was checked in full extension, mid-flexion, and deep flexion. Proper tibial rotation was determined and marked.  The patella tracked well without a lateral release. Trial components were then removed and tibial preparation performed.  The tibial bone quality was excellent.  The tibia was sized for a size E component.  The bony surfaces were irrigated with a  pulse lavage and then dried. Final components were placed.  The stability of the construct was  re-evaluated throughout a range of motion and found to be acceptable. The trial liner was removed, the knee was copiously irrigated, and the knee was re-evaluated for any excess bone debris. The real polyethylene liner, 12 mm thick, was inserted and checked to ensure the locking mechanism had engaged appropriately. The tourniquet was deflated and hemostasis was achieved. The wound was irrigated with normal saline.  One gram of vancomycin powder was placed in the surgical bed.  Topical 0.25% bupivacaine and meloxicam was placed in the joint for postoperative pain.  Capsular closure was performed with a #1 vicryl at 45 degrees of flexion, subcutaneous fat closed with a 0 vicryl suture, then subcutaneous tissue closed with interrupted 2.0 vicryl suture. The skin was then closed with a 2.0 nylon and dermabond. A sterile dressing was applied.  The patient was awakened in the operating room and taken to recovery in stable condition. All sponge, needle, and instrument counts were correct at the end of the case.  Tessa Lerner was necessary for opening, closing, retracting, limb positioning and overall facilitation and completion of the surgery.  Position: supine  Complications: none.  Time Out: performed   Drains/Packing: none  Estimated blood loss: minimal  Returned to Recovery Room: in good condition.   Antibiotics: yes   Mechanical VTE (DVT) Prophylaxis: sequential compression devices, TED thigh-high  Chemical VTE (DVT) Prophylaxis: aspirin  Fluid Replacement  Crystalloid: see anesthesia record Blood: none  FFP: none   Specimens Removed: 1 to pathology   Sponge and Instrument Count Correct? yes   PACU: portable radiograph - knee AP and Lateral   Plan/RTC: Return in 2 weeks for wound check.   Weight Bearing/Load Lower Extremity: full   Implant Name Type Inv. Item Serial No. Manufacturer Lot No. LRB No. Used Action  STEM TIB PERS SZ E 5D LT - [id number] Screw STEM TIB PERS SZ E 5D LT   ZIMMER [employer]ON(ORTH,TRAU,BIO,SG) [id number] Left 1 Implanted  COMP PATELLA PEG 3 32 - [id number] Joint COMP PATELLA PEG 3 32  ZIMMER [employer]ON(ORTH,TRAU,BIO,SG) [id number] Left 1 Implanted  COMP FEM KNEE STD PS 7 LT - [id number] Joint COMP FEM KNEE STD PS 7 LT  ZIMMER [employer]ON(ORTH,TRAU,BIO,SG) [id number] Left 1 Implanted  STEM TIBIAL SZ6-7 EF 12 LT - [id number] Knees STEM TIBIAL SZ6-7 EF 12 LT  [employer]IMMER [employer]ON(ORTH,TRAU,BIO,SG) [id number] Left 1 Implanted    N. Glee Arvin, MD [facility] 9:07 AM

## 2023-09-06 NOTE — Progress Notes (Signed)
    Durable Medical Equipment  (From admission, onward)           Start     Ordered   09/06/23 1048  DME Walker rolling  Once       Question Answer Comment  Walker: With 5 Inch Wheels   Patient needs a walker to treat with the following condition Status post left partial knee replacement      09/06/23 1047   09/06/23 1048  DME 3 n 1  Once        09/06/23 1047   09/06/23 1048  DME Bedside commode  Once       Question:  Patient needs a bedside commode to treat with the following condition  Answer:  Status post left partial knee replacement   09/06/23 1047

## 2023-09-06 NOTE — Evaluation (Signed)
Physical Therapy Evaluation Patient Details Name: Adrienne Garcia MRN: [id number] DOB: [birth year] Today's Date: 09/06/2023  History of Present Illness  57 y.o. female presents to [facility] hospital on 09/06/2023 for elective L TKA. PMH includes depression, bipolar affective disorder.  Clinical Impression  Pt presents to PT with deficits in functional mobility, gait, balance, strength, power. Pt is impulsive during session and has multiple instances of L knee instability due to impaired attention to gait pattern. Pt requires physical assistance to correct initial loss of balance associated with knee instability. Upon PT arrival pt sleeping in L sidelying with both knees bent. PT provides extensive education on the need for L knee extension when resting. PT will follow up tomorrow for further gait and mobility training.        If plan is discharge home, recommend the following: A lot of help with walking and/or transfers;A lot of help with bathing/dressing/bathroom;Assistance with cooking/housework;Assist for transportation;Help with stairs or ramp for entrance   Can travel by private vehicle        Equipment Recommendations BSC/3in1  Recommendations for Other Services       Functional Status Assessment Patient has had a recent decline in their functional status and demonstrates the ability to make significant improvements in function in a reasonable and predictable amount of time.     Precautions / Restrictions Precautions Precautions: Fall Precaution Comments: impulsive, L knee buckling Restrictions Weight Bearing Restrictions: Yes LLE Weight Bearing: Weight bearing as tolerated      Mobility  Bed Mobility Overal bed mobility: Needs Assistance Bed Mobility: Supine to Sit, Sit to Supine     Supine to sit: Supervision Sit to supine: Contact guard assist        Transfers Overall transfer level: Needs assistance Equipment used: Rolling walker (2 wheels) Transfers: Sit to/from  Stand Sit to Stand: Contact guard assist                Ambulation/Gait Ambulation/Gait assistance: Mod assist, Contact guard assist Gait Distance (Feet): 80 Feet Assistive device: Rolling walker (2 wheels) Gait Pattern/deviations: Step-through pattern, Knee flexed in stance - left, Knees buckling Gait velocity: reduced Gait velocity interpretation: <1.8 ft/sec, indicate of risk for recurrent falls   General Gait Details: pt impulsively attempts to initiate ambulation after standing, resulting in L knee buckle requiring modA from PT to correct LOB. PT provides cues to offload LLE with UEs on RW to improve stability. Pt demonstrates good balance when focused on gait pattern, however has 2 more instances of mild L knee buckling when distracted during ambulation  Stairs            Wheelchair Mobility     Tilt Bed    Modified Rankin (Stroke Patients Only)       Balance Overall balance assessment: Needs assistance Sitting-balance support: No upper extremity supported, Feet supported Sitting balance-Leahy Scale: Good     Standing balance support: Single extremity supported, Bilateral upper extremity supported, Reliant on assistive device for balance Standing balance-Leahy Scale: Poor                               Pertinent Vitals/Pain Pain Assessment Pain Assessment: 0-10 Pain Score: 8  Pain Location: L knee Pain Descriptors / Indicators: Aching Pain Intervention(s): Monitored during session    Home Living Family/patient expects to be discharged to:: Private residence Living Arrangements: Spouse/significant other Available Help at Discharge: Family;Available 24 hours/day Type of Home: House Home  Access: Stairs to enter Entrance Stairs-Rails: Can reach both Entrance Stairs-Number of Steps: 4   Home Layout: One level Home Equipment: Agricultural consultant (2 wheels);Cane - single point      Prior Function Prior Level of Function : Independent/Modified  Independent                     Extremity/Trunk Assessment   Upper Extremity Assessment Upper Extremity Assessment: Overall WFL for tasks assessed    Lower Extremity Assessment Lower Extremity Assessment: LLE deficits/detail LLE Deficits / Details: generalized post-op weakness and ROM deficits expected s/p TKA    Cervical / Trunk Assessment Cervical / Trunk Assessment: Normal  Communication   Communication Communication: No apparent difficulties Cueing Techniques: Verbal cues  Cognition Arousal: Alert Behavior During Therapy: Impulsive Overall Cognitive Status: Within Functional Limits for tasks assessed                                          General Comments General comments (skin integrity, edema, etc.): VSS on RA    Exercises Other Exercises Other Exercises: PT provides education on TKR exercise packet   Assessment/Plan    PT Assessment Patient needs continued PT services  PT Problem List Decreased strength;Decreased range of motion;Decreased activity tolerance;Decreased balance;Decreased mobility;Decreased cognition;Decreased knowledge of use of DME;Decreased safety awareness;Decreased knowledge of precautions;Pain       PT Treatment Interventions Stair training;Gait training;DME instruction;Functional mobility training;Therapeutic activities;Therapeutic exercise;Balance training;Neuromuscular re-education;Patient/family education    PT Goals (Current goals can be found in the Care Plan section)  Acute Rehab PT Goals Patient Stated Goal: to return to independence PT Goal Formulation: With patient Time For Goal Achievement: 09/10/23 Potential to Achieve Goals: Fair    Frequency Min 1X/week     Co-evaluation               AM-PAC PT "6 Clicks" Mobility  Outcome Measure Help needed turning from your back to your side while in a flat bed without using bedrails?: A Little Help needed moving from lying on your back to sitting on  the side of a flat bed without using bedrails?: A Little Help needed moving to and from a bed to a chair (including a wheelchair)?: A Little Help needed standing up from a chair using your arms (e.g., wheelchair or bedside chair)?: A Little Help needed to walk in hospital room?: A Lot Help needed climbing 3-5 steps with a railing? : Total 6 Click Score: 15    End of Session Equipment Utilized During Treatment: Gait belt Activity Tolerance: Patient tolerated treatment well Patient left: in bed;with call bell/phone within reach;with bed alarm set Nurse Communication: Mobility status PT Visit Diagnosis: Other abnormalities of gait and mobility (R26.89);Muscle weakness (generalized) (M62.81)    Time: 1610-9604 PT Time Calculation (min) (ACUTE ONLY): 21 min   Charges:   PT Evaluation $PT Eval Low Complexity: 1 Low   PT General Charges $$ ACUTE PT VISIT: 1 Visit         Arlyss Gandy, PT, DPT Acute Rehabilitation Office [phone]   Arlyss Gandy 09/06/2023, 2:12 PM

## 2023-09-06 NOTE — Anesthesia Postprocedure Evaluation (Signed)
Anesthesia Post Note  Patient: Karson Chicas  Procedure(s) Performed: LEFT TOTAL KNEE ARTHROPLASTY (Left: Knee)     Patient location during evaluation: PACU Anesthesia Type: Spinal Level of consciousness: awake and alert Pain management: pain level controlled Vital Signs Assessment: post-procedure vital signs reviewed and stable Respiratory status: spontaneous breathing, nonlabored ventilation and respiratory function stable Cardiovascular status: blood pressure returned to baseline Postop Assessment: no apparent nausea or vomiting, spinal receding, no headache and no backache Anesthetic complications: no   No notable events documented.  Last Vitals:  Vitals:   09/06/23 0945 09/06/23 1000  BP: 101/62 109/77  Pulse: (!) 39 (!) 41  Resp: 14 12  Temp:  (!) 36.1 C  SpO2: 95% 96%    Last Pain:  Vitals:   09/06/23 1000  TempSrc:   PainSc: 5                  Shanda Howells

## 2023-09-07 ENCOUNTER — Telehealth: Payer: Self-pay

## 2023-09-07 ENCOUNTER — Encounter (HOSPITAL_COMMUNITY): Payer: Self-pay | Admitting: Orthopaedic Surgery

## 2023-09-07 ENCOUNTER — Other Ambulatory Visit: Payer: Self-pay | Admitting: Family Medicine

## 2023-09-07 DIAGNOSIS — Z7982 Long term (current) use of aspirin: Secondary | ICD-10-CM | POA: Diagnosis not present

## 2023-09-07 DIAGNOSIS — J45909 Unspecified asthma, uncomplicated: Secondary | ICD-10-CM | POA: Diagnosis not present

## 2023-09-07 DIAGNOSIS — Z79899 Other long term (current) drug therapy: Secondary | ICD-10-CM | POA: Diagnosis not present

## 2023-09-07 DIAGNOSIS — Z87891 Personal history of nicotine dependence: Secondary | ICD-10-CM | POA: Diagnosis not present

## 2023-09-07 DIAGNOSIS — Z85828 Personal history of other malignant neoplasm of skin: Secondary | ICD-10-CM | POA: Diagnosis not present

## 2023-09-07 DIAGNOSIS — G8929 Other chronic pain: Secondary | ICD-10-CM

## 2023-09-07 DIAGNOSIS — J449 Chronic obstructive pulmonary disease, unspecified: Secondary | ICD-10-CM | POA: Diagnosis not present

## 2023-09-07 DIAGNOSIS — M1712 Unilateral primary osteoarthritis, left knee: Secondary | ICD-10-CM | POA: Diagnosis not present

## 2023-09-07 DIAGNOSIS — Z96652 Presence of left artificial knee joint: Secondary | ICD-10-CM | POA: Diagnosis not present

## 2023-09-07 NOTE — Telephone Encounter (Signed)
I ordered.

## 2023-09-07 NOTE — Progress Notes (Signed)
   Subjective:  Patient reports pain as moderate mainly behind the knee.  Objective:   VITALS:   Vitals:   09/06/23 1000 09/06/23 1843 09/06/23 2100 09/07/23 0514  BP: 109/77 (!) 145/81 111/62 (!) 103/53  Pulse: (!) 41 (!) 52 (!) 52 (!) 54  Resp: 12 16 18    Temp: (!) 97 F (36.1 C) 98.2 F (36.8 C) 98.2 F (36.8 C) 98.3 F (36.8 C)  TempSrc:  Oral Oral Oral  SpO2: 96% 95% 99% 98%  Weight:      Height:        Intact pulses distally Dorsiflexion/Plantar flexion intact Incision: dressing C/D/I and no drainage   Lab Results  Component Value Date   WBC 11.7 (H) 08/23/2023   HGB 13.7 08/23/2023   HCT 42.6 08/23/2023   MCV 98.4 08/23/2023   PLT 231 08/23/2023     Assessment/Plan:  1 Day Post-Op   - Expected postop acute blood loss anemia - Up with PT/OT - DVT ppx - SCDs, ambulation, aspirin - WBAT operative extremity - Pain control - Discharge planning - home today after PT and urinates  Adrienne Garcia 09/07/2023, 7:33 AM

## 2023-09-07 NOTE — Progress Notes (Signed)
Physical Therapy Treatment Patient Details Name: Adrienne Garcia MRN: [id number] DOB: 1966-08-21 Today's Date: 09/07/2023   History of Present Illness 57 y.o. female presents to [facility] hospital on 09/06/2023 for elective L TKA. PMH includes depression, bipolar affective disorder.    PT Comments  Pt is limited by L knee pain during session. Pt with L knee flexed upon PT arrival again today. PT provides further education on the need for knee extension when resting. Pt may benefit from a knee immobilizer for LLE as she continues to rest in knee flexion. Pt's tolerance for ambulation is limited by pain. Pt does follow commands to negotiate stairs without significant physical assistance. Pt is eager to discharge home. PT will follow up for further mobility training if she remains admitted.    If plan is discharge home, recommend the following: A little help with walking and/or transfers;A lot of help with bathing/dressing/bathroom;Assistance with cooking/housework;Assist for transportation;Help with stairs or ramp for entrance   Can travel by private vehicle        Equipment Recommendations  BSC/3in1    Recommendations for Other Services       Precautions / Restrictions Precautions Precautions: Fall Precaution Comments: impulsive, L knee buckling Required Braces or Orthoses:  (notified ortho care about possible need for knee immobilizer to improve knee extension when sleeping/resting) Restrictions Weight Bearing Restrictions: Yes LLE Weight Bearing: Weight bearing as tolerated     Mobility  Bed Mobility Overal bed mobility: Needs Assistance Bed Mobility: Supine to Sit, Sit to Supine     Supine to sit: Supervision Sit to supine: Contact guard assist        Transfers Overall transfer level: Needs assistance Equipment used: Rolling walker (2 wheels) Transfers: Sit to/from Stand Sit to Stand: Contact guard assist                Ambulation/Gait Ambulation/Gait assistance:  Contact guard assist Gait Distance (Feet): 50 Feet Assistive device: Rolling walker (2 wheels) Gait Pattern/deviations: Step-to pattern, Decreased dorsiflexion - left Gait velocity: reduced Gait velocity interpretation: <1.8 ft/sec, indicate of risk for recurrent falls   General Gait Details: reduced knee flexion in LLE throughout gait cycle, LLE sliding across ground rather than clearing with each step, no buckling noted this session, although pt still lacking terminal knee extension   Stairs Stairs: Yes Stairs assistance: Contact guard assist Stair Management: Two rails, Step to pattern, Forwards Number of Stairs: 2 General stair comments: verbal cues to ascend with RLE and descend with LLE   Wheelchair Mobility     Tilt Bed    Modified Rankin (Stroke Patients Only)       Balance Overall balance assessment: Needs assistance Sitting-balance support: No upper extremity supported, Feet supported Sitting balance-Leahy Scale: Good     Standing balance support: Bilateral upper extremity supported, Reliant on assistive device for balance Standing balance-Leahy Scale: Poor                              Cognition Arousal: Alert Behavior During Therapy: WFL for tasks assessed/performed Overall Cognitive Status: Within Functional Limits for tasks assessed                                          Exercises Total Joint Exercises Goniometric ROM: -5 extension, 75 flexion Other Exercises Other Exercises: PT encourages quad sets and exercises  to aide in knee extension and quad function. Other Exercises: PT strongly reinforces the need to maintain knee extension when resting, NO PILLOWS OR BLANKETS TO REST UNDER KNEE/THIGH    General Comments General comments (skin integrity, edema, etc.): VSS on RA      Pertinent Vitals/Pain Pain Assessment Pain Assessment: 0-10 Pain Score: 8  Pain Location: L knee Pain Descriptors / Indicators: Aching Pain  Intervention(s): Premedicated before session    Home Living                          Prior Function            PT Goals (current goals can now be found in the care plan section) Acute Rehab PT Goals Patient Stated Goal: to return to independence Progress towards PT goals: Progressing toward goals    Frequency    Min 1X/week      PT Plan      Co-evaluation              AM-PAC PT "6 Clicks" Mobility   Outcome Measure  Help needed turning from your back to your side while in a flat bed without using bedrails?: A Little Help needed moving from lying on your back to sitting on the side of a flat bed without using bedrails?: A Little Help needed moving to and from a bed to a chair (including a wheelchair)?: A Little Help needed standing up from a chair using your arms (e.g., wheelchair or bedside chair)?: A Little Help needed to walk in hospital room?: A Little Help needed climbing 3-5 steps with a railing? : A Little 6 Click Score: 18    End of Session Equipment Utilized During Treatment: Gait belt Activity Tolerance: Patient limited by pain Patient left: in bed;with call bell/phone within reach Nurse Communication: Mobility status PT Visit Diagnosis: Other abnormalities of gait and mobility (R26.89);Muscle weakness (generalized) (M62.81)     Time: 1610-9604 PT Time Calculation (min) (ACUTE ONLY): 24 min  Charges:    $Gait Training: 8-22 mins $Therapeutic Activity: 8-22 mins PT General Charges $$ ACUTE PT VISIT: 1 Visit                     Arlyss Gandy, PT, DPT Acute Rehabilitation Office [phone]    Arlyss Gandy 09/07/2023, 9:47 AM

## 2023-09-07 NOTE — Plan of Care (Signed)
  Problem: Activity: Goal: Ability to avoid complications of mobility impairment will improve Outcome: Progressing   Problem: Pain Management: Goal: Pain level will decrease with appropriate interventions Outcome: Progressing   Problem: Activity: Goal: Risk for activity intolerance will decrease Outcome: Progressing   Problem: Coping: Goal: Level of anxiety will decrease Outcome: Progressing   Problem: Pain Managment: Goal: General experience of comfort will improve Outcome: Progressing

## 2023-09-07 NOTE — Plan of Care (Signed)

## 2023-09-07 NOTE — Discharge Summary (Signed)
Patient ID: Adrienne Garcia MRN: [id number] DOB/AGE: 12/28/1965 57 y.o.  Admit date: 09/06/2023 Discharge date: 09/07/2023  Admission Diagnoses:  Primary osteoarthritis of left knee  Discharge Diagnoses:  Principal Problem:   Primary osteoarthritis of left knee Active Problems:   Status post total left knee replacement   Past Medical History:  Diagnosis Date   Anemia    Anxiety    Arthritis    knees   Asthma    Basal cell carcinoma of neck    "front of my neck; it was a melanoma"   Bipolar disorder (HCC)    COPD (chronic obstructive pulmonary disease) (HCC)    Daily headache    Depression    Family history of adverse reaction to anesthesia    MOM-HARD TO WAKE UP   Fibromyalgia    GERD (gastroesophageal reflux disease)    Heart murmur    H/O   History of hiatal hernia    SMALL   Intractable nausea and vomiting    Migraine    "probably twice/month" (09/19/2015)   Obesity    PTSD (post-traumatic stress disorder)    Thyroid disease     Surgeries: Procedure(s): LEFT TOTAL KNEE ARTHROPLASTY on 09/06/2023   Consultants (if any):   Discharged Condition: Improved  Hospital Course: Adrienne Garcia is an 57 y.o. female who was admitted 09/06/2023 with a diagnosis of Primary osteoarthritis of left knee and went to the operating room on 09/06/2023 and underwent the above named procedures.    She was given perioperative antibiotics:  Anti-infectives (From admission, onward)    Start     Dose/Rate Route Frequency Ordered Stop   09/06/23 1745  ceFAZolin (ANCEF) IVPB 2g/100 mL premix        2 g 200 mL/hr over 30 Minutes Intravenous Every 6 hours 09/06/23 1648 09/06/23 2234   09/06/23 0841  vancomycin (VANCOCIN) powder  Status:  Discontinued          As needed 09/06/23 0842 09/06/23 0916   09/06/23 0600  ceFAZolin (ANCEF) IVPB 2g/100 mL premix        2 g 200 mL/hr over 30 Minutes Intravenous On call to O.R. 09/06/23 0543 09/06/23 0726     .  She was given sequential  compression devices, early ambulation, and appropriate chemoprophylaxis for DVT prophylaxis.  She benefited maximally from the hospital stay and there were no complications.    Recent vital signs:  Vitals:   09/06/23 2100 09/07/23 0514  BP: 111/62 (!) 103/53  Pulse: (!) 52 (!) 54  Resp: 18   Temp: 98.2 F (36.8 C) 98.3 F (36.8 C)  SpO2: 99% 98%    Recent laboratory studies:  Lab Results  Component Value Date   HGB 13.7 08/23/2023   HGB 14.1 06/25/2023   HGB 13.9 04/02/2023   Lab Results  Component Value Date   WBC 11.7 (H) 08/23/2023   PLT 231 08/23/2023   No results found for: "INR" Lab Results  Component Value Date   NA 140 08/23/2023   K 4.3 08/23/2023   CL 105 08/23/2023   CO2 25 08/23/2023   BUN 9 08/23/2023   CREATININE 1.10 (H) 08/23/2023   GLUCOSE 94 08/23/2023    Discharge Medications:   Allergies as of 09/07/2023       Reactions   Ciprofloxacin Hives        Medication List     STOP taking these medications    acetaminophen 500 MG tablet Commonly known as: TYLENOL  Patient ID: Adrienne Garcia MRN: [id number] DOB/AGE: 12/28/1965 57 y.o.  Admit date: 09/06/2023 Discharge date: 09/07/2023  Admission Diagnoses:  Primary osteoarthritis of left knee  Discharge Diagnoses:  Principal Problem:   Primary osteoarthritis of left knee Active Problems:   Status post total left knee replacement   Past Medical History:  Diagnosis Date   Anemia    Anxiety    Arthritis    knees   Asthma    Basal cell carcinoma of neck    "front of my neck; it was a melanoma"   Bipolar disorder (HCC)    COPD (chronic obstructive pulmonary disease) (HCC)    Daily headache    Depression    Family history of adverse reaction to anesthesia    MOM-HARD TO WAKE UP   Fibromyalgia    GERD (gastroesophageal reflux disease)    Heart murmur    H/O   History of hiatal hernia    SMALL   Intractable nausea and vomiting    Migraine    "probably twice/month" (09/19/2015)   Obesity    PTSD (post-traumatic stress disorder)    Thyroid disease     Surgeries: Procedure(s): LEFT TOTAL KNEE ARTHROPLASTY on 09/06/2023   Consultants (if any):   Discharged Condition: Improved  Hospital Course: Adrienne Garcia is an 57 y.o. female who was admitted 09/06/2023 with a diagnosis of Primary osteoarthritis of left knee and went to the operating room on 09/06/2023 and underwent the above named procedures.    She was given perioperative antibiotics:  Anti-infectives (From admission, onward)    Start     Dose/Rate Route Frequency Ordered Stop   09/06/23 1745  ceFAZolin (ANCEF) IVPB 2g/100 mL premix        2 g 200 mL/hr over 30 Minutes Intravenous Every 6 hours 09/06/23 1648 09/06/23 2234   09/06/23 0841  vancomycin (VANCOCIN) powder  Status:  Discontinued          As needed 09/06/23 0842 09/06/23 0916   09/06/23 0600  ceFAZolin (ANCEF) IVPB 2g/100 mL premix        2 g 200 mL/hr over 30 Minutes Intravenous On call to O.R. 09/06/23 0543 09/06/23 0726     .  She was given sequential  compression devices, early ambulation, and appropriate chemoprophylaxis for DVT prophylaxis.  She benefited maximally from the hospital stay and there were no complications.    Recent vital signs:  Vitals:   09/06/23 2100 09/07/23 0514  BP: 111/62 (!) 103/53  Pulse: (!) 52 (!) 54  Resp: 18   Temp: 98.2 F (36.8 C) 98.3 F (36.8 C)  SpO2: 99% 98%    Recent laboratory studies:  Lab Results  Component Value Date   HGB 13.7 08/23/2023   HGB 14.1 06/25/2023   HGB 13.9 04/02/2023   Lab Results  Component Value Date   WBC 11.7 (H) 08/23/2023   PLT 231 08/23/2023   No results found for: "INR" Lab Results  Component Value Date   NA 140 08/23/2023   K 4.3 08/23/2023   CL 105 08/23/2023   CO2 25 08/23/2023   BUN 9 08/23/2023   CREATININE 1.10 (H) 08/23/2023   GLUCOSE 94 08/23/2023    Discharge Medications:   Allergies as of 09/07/2023       Reactions   Ciprofloxacin Hives        Medication List     STOP taking these medications    acetaminophen 500 MG tablet Commonly known as: TYLENOL  cyclobenzaprine 5 MG tablet Commonly known as: FLEXERIL   meloxicam 15 MG tablet Commonly known as: MOBIC       TAKE these medications    aspirin EC 81 MG tablet Take 1 tablet (81 mg total) by mouth 2 (two) times daily. To be taken after surgery to prevent blood clots   docusate sodium 100 MG capsule Commonly known as: Colace Take 1 capsule (100 mg total) by mouth daily as needed.   methocarbamol 750 MG tablet Commonly known as: Robaxin-750 Take 1 tablet (750 mg total) by mouth 2 (two) times daily as needed for muscle spasms.   ondansetron 4 MG tablet Commonly known as: Zofran Take 1 tablet (4 mg total) by mouth every 8 (eight) hours as needed for nausea or vomiting.   oxyCODONE-acetaminophen 5-325 MG tablet Commonly known as: Percocet Take 1-2 tablets by mouth every 6 (six) hours as needed. To be taken after surgery   pantoprazole 40 MG tablet Commonly known  as: PROTONIX Take 1 tablet (40 mg total) by mouth as needed.   promethazine 25 MG tablet Commonly known as: PHENERGAN Take 1 tablet (25 mg total) by mouth every 6 (six) hours as needed for nausea or vomiting.               Durable Medical Equipment  (From admission, onward)           Start     Ordered   09/06/23 1048  DME Walker rolling  Once       Question Answer Comment  Walker: With 5 Inch Wheels   Patient needs a walker to treat with the following condition Status post left partial knee replacement      09/06/23 1047   09/06/23 1048  DME 3 n 1  Once        09/06/23 1047   09/06/23 1048  DME Bedside commode  Once       Question:  Patient needs a bedside commode to treat with the following condition  Answer:  Status post left partial knee replacement   09/06/23 1047            Diagnostic Studies: DG Knee Left Port  Result Date: 09/06/2023 CLINICAL DATA:  Postop left knee replacement. EXAM: PORTABLE LEFT KNEE - 1-2 VIEW COMPARISON:  None Available. FINDINGS: Left knee arthroplasty in expected alignment. No periprosthetic lucency or fracture. Recent postsurgical change includes air and edema in the soft tissues and joint space. Overlying artifact from presumed brace. IMPRESSION: Left knee arthroplasty without immediate postoperative complication. Electronically Signed   By: Narda Rutherford M.D.   On: 09/06/2023 10:26   ECHOCARDIOGRAM COMPLETE  Result Date: 08/25/2023    ECHOCARDIOGRAM REPORT   Patient Name:   Katerina Zurn  Date of Exam: 08/25/2023 Medical Rec #:  [id number]     Height:       66.0 in Accession #:    [id number]    Weight:       207.2 lb Date of Birth:  1966/06/08    BSA:          2.030 m Patient Age:    56 years      BP:           130/84 mmHg Patient Gender: F             HR:           51 bpm. Exam Location:  [address] Procedure: 2D Echo, Cardiac Doppler and Color Doppler Indications:  Patient ID: Adrienne Garcia MRN: [id number] DOB/AGE: 12/28/1965 57 y.o.  Admit date: 09/06/2023 Discharge date: 09/07/2023  Admission Diagnoses:  Primary osteoarthritis of left knee  Discharge Diagnoses:  Principal Problem:   Primary osteoarthritis of left knee Active Problems:   Status post total left knee replacement   Past Medical History:  Diagnosis Date   Anemia    Anxiety    Arthritis    knees   Asthma    Basal cell carcinoma of neck    "front of my neck; it was a melanoma"   Bipolar disorder (HCC)    COPD (chronic obstructive pulmonary disease) (HCC)    Daily headache    Depression    Family history of adverse reaction to anesthesia    MOM-HARD TO WAKE UP   Fibromyalgia    GERD (gastroesophageal reflux disease)    Heart murmur    H/O   History of hiatal hernia    SMALL   Intractable nausea and vomiting    Migraine    "probably twice/month" (09/19/2015)   Obesity    PTSD (post-traumatic stress disorder)    Thyroid disease     Surgeries: Procedure(s): LEFT TOTAL KNEE ARTHROPLASTY on 09/06/2023   Consultants (if any):   Discharged Condition: Improved  Hospital Course: Adrienne Garcia is an 57 y.o. female who was admitted 09/06/2023 with a diagnosis of Primary osteoarthritis of left knee and went to the operating room on 09/06/2023 and underwent the above named procedures.    She was given perioperative antibiotics:  Anti-infectives (From admission, onward)    Start     Dose/Rate Route Frequency Ordered Stop   09/06/23 1745  ceFAZolin (ANCEF) IVPB 2g/100 mL premix        2 g 200 mL/hr over 30 Minutes Intravenous Every 6 hours 09/06/23 1648 09/06/23 2234   09/06/23 0841  vancomycin (VANCOCIN) powder  Status:  Discontinued          As needed 09/06/23 0842 09/06/23 0916   09/06/23 0600  ceFAZolin (ANCEF) IVPB 2g/100 mL premix        2 g 200 mL/hr over 30 Minutes Intravenous On call to O.R. 09/06/23 0543 09/06/23 0726     .  She was given sequential  compression devices, early ambulation, and appropriate chemoprophylaxis for DVT prophylaxis.  She benefited maximally from the hospital stay and there were no complications.    Recent vital signs:  Vitals:   09/06/23 2100 09/07/23 0514  BP: 111/62 (!) 103/53  Pulse: (!) 52 (!) 54  Resp: 18   Temp: 98.2 F (36.8 C) 98.3 F (36.8 C)  SpO2: 99% 98%    Recent laboratory studies:  Lab Results  Component Value Date   HGB 13.7 08/23/2023   HGB 14.1 06/25/2023   HGB 13.9 04/02/2023   Lab Results  Component Value Date   WBC 11.7 (H) 08/23/2023   PLT 231 08/23/2023   No results found for: "INR" Lab Results  Component Value Date   NA 140 08/23/2023   K 4.3 08/23/2023   CL 105 08/23/2023   CO2 25 08/23/2023   BUN 9 08/23/2023   CREATININE 1.10 (H) 08/23/2023   GLUCOSE 94 08/23/2023    Discharge Medications:   Allergies as of 09/07/2023       Reactions   Ciprofloxacin Hives        Medication List     STOP taking these medications    acetaminophen 500 MG tablet Commonly known as: TYLENOL  cyclobenzaprine 5 MG tablet Commonly known as: FLEXERIL   meloxicam 15 MG tablet Commonly known as: MOBIC       TAKE these medications    aspirin EC 81 MG tablet Take 1 tablet (81 mg total) by mouth 2 (two) times daily. To be taken after surgery to prevent blood clots   docusate sodium 100 MG capsule Commonly known as: Colace Take 1 capsule (100 mg total) by mouth daily as needed.   methocarbamol 750 MG tablet Commonly known as: Robaxin-750 Take 1 tablet (750 mg total) by mouth 2 (two) times daily as needed for muscle spasms.   ondansetron 4 MG tablet Commonly known as: Zofran Take 1 tablet (4 mg total) by mouth every 8 (eight) hours as needed for nausea or vomiting.   oxyCODONE-acetaminophen 5-325 MG tablet Commonly known as: Percocet Take 1-2 tablets by mouth every 6 (six) hours as needed. To be taken after surgery   pantoprazole 40 MG tablet Commonly known  as: PROTONIX Take 1 tablet (40 mg total) by mouth as needed.   promethazine 25 MG tablet Commonly known as: PHENERGAN Take 1 tablet (25 mg total) by mouth every 6 (six) hours as needed for nausea or vomiting.               Durable Medical Equipment  (From admission, onward)           Start     Ordered   09/06/23 1048  DME Walker rolling  Once       Question Answer Comment  Walker: With 5 Inch Wheels   Patient needs a walker to treat with the following condition Status post left partial knee replacement      09/06/23 1047   09/06/23 1048  DME 3 n 1  Once        09/06/23 1047   09/06/23 1048  DME Bedside commode  Once       Question:  Patient needs a bedside commode to treat with the following condition  Answer:  Status post left partial knee replacement   09/06/23 1047            Diagnostic Studies: DG Knee Left Port  Result Date: 09/06/2023 CLINICAL DATA:  Postop left knee replacement. EXAM: PORTABLE LEFT KNEE - 1-2 VIEW COMPARISON:  None Available. FINDINGS: Left knee arthroplasty in expected alignment. No periprosthetic lucency or fracture. Recent postsurgical change includes air and edema in the soft tissues and joint space. Overlying artifact from presumed brace. IMPRESSION: Left knee arthroplasty without immediate postoperative complication. Electronically Signed   By: Narda Rutherford M.D.   On: 09/06/2023 10:26   ECHOCARDIOGRAM COMPLETE  Result Date: 08/25/2023    ECHOCARDIOGRAM REPORT   Patient Name:   Katerina Zurn  Date of Exam: 08/25/2023 Medical Rec #:  [id number]     Height:       66.0 in Accession #:    [id number]    Weight:       207.2 lb Date of Birth:  1966/06/08    BSA:          2.030 m Patient Age:    56 years      BP:           130/84 mmHg Patient Gender: F             HR:           51 bpm. Exam Location:  [address] Procedure: 2D Echo, Cardiac Doppler and Color Doppler Indications:

## 2023-09-07 NOTE — Progress Notes (Signed)
Orthopedic Tech Progress Note Patient Details:  Adrienne Garcia Jun 22, 1966 [id number] Applied knee immobilizer per order.  Ortho Devices Type of Ortho Device: Knee Immobilizer Ortho Device/Splint Location: LLE Ortho Device/Splint Interventions: Ordered, Application, Adjustment   Post Interventions Patient Tolerated: Well Instructions Provided: Adjustment of device, Care of device  [name] 09/07/2023, 12:55 PM

## 2023-09-07 NOTE — Progress Notes (Signed)
Patient has Worked with PT.  Patient has voided 2 times.  All DME and Knee immobilizer has been delivered.  AVS reviewed and all questions answered

## 2023-09-07 NOTE — TOC Transition Note (Addendum)
Transition of Care [facility]) - CM/SW Discharge Note   Patient Details  Name: Adrienne Garcia MRN: [id number] Date of Birth: [birth year]  Transition of Care [facility] CM/SW Contact:  Epifanio Lesches, RN Phone Number: 09/07/2023, 11:46 AM   Clinical Narrative:    Patient will DC to: home Anticipated DC date: 09/07/2023 Family notified: yes Transport by: car        - s/p L TKA , 10/7 Per MD patient ready for DC today. RN, patient, patient's husband [facility]  ( prearranged by provider) notified of DC. Husband to assist with care once d/c to home. Referral for [facility] made with [employer]. Equipment will be delivered to bedside prior to discharge. Pt without RX med concerns.  Husband to provide transportation to home. Post hospital f/u noted on AVS.  RNCM will sign off for now as intervention is no longer needed. Please consult [place] again if new needs arise.   Final next level of care: Home w Home Health Services Barriers to Discharge: No Barriers Identified   Patient Goals and CMS Choice   Choice offered to / list presented to : Patient  Discharge Placement                         Discharge Plan and Services Additional resources added to the After Visit Summary for                  DME Arranged: Bedside commode (declined RW) DME Agency: [employer] Date DME Agency Contacted: 09/07/23 Time DME Agency Contacted: [phone] Representative spoke with at DME Agency: Vaughan Basta HH Arranged: PT HH Agency: [facility] Date [facility] Agency Contacted: 09/07/23 Time HH Agency Contacted: [phone] Representative spoke with at [facility] Agency: Amy  Social Determinants of Health (SDOH) Interventions SDOH Screenings   Food Insecurity: No Food Insecurity (09/06/2023)  Housing: Patient Declined (09/06/2023)  Transportation Needs: No Transportation Needs (09/06/2023)  Utilities: Not At Risk (09/06/2023)  Depression (PHQ2-9): High Risk (04/02/2023)  Tobacco Use: Medium Risk (08/23/2023)      Readmission Risk Interventions     No data to display

## 2023-09-07 NOTE — Telephone Encounter (Signed)
Anders Simmonds from acute care PT called stating that the patient will need a knee immobilizer to help keep her knee straight while sleeping. CB # [phone]

## 2023-09-10 ENCOUNTER — Emergency Department (HOSPITAL_COMMUNITY)
Admission: EM | Admit: 2023-09-10 | Discharge: 2023-09-10 | Disposition: A | Discharge status: Home / Self Care | Payer: Medicaid Other | Attending: Emergency Medicine | Admitting: Emergency Medicine

## 2023-09-10 ENCOUNTER — Emergency Department (HOSPITAL_COMMUNITY): Payer: Medicaid Other

## 2023-09-10 ENCOUNTER — Other Ambulatory Visit: Payer: Self-pay

## 2023-09-10 ENCOUNTER — Encounter (HOSPITAL_COMMUNITY): Payer: Self-pay | Admitting: *Deleted

## 2023-09-10 DIAGNOSIS — Z0389 Encounter for observation for other suspected diseases and conditions ruled out: Secondary | ICD-10-CM | POA: Diagnosis not present

## 2023-09-10 DIAGNOSIS — M79662 Pain in left lower leg: Secondary | ICD-10-CM | POA: Diagnosis not present

## 2023-09-10 DIAGNOSIS — R600 Localized edema: Secondary | ICD-10-CM | POA: Insufficient documentation

## 2023-09-10 DIAGNOSIS — Z7982 Long term (current) use of aspirin: Secondary | ICD-10-CM | POA: Diagnosis not present

## 2023-09-10 DIAGNOSIS — J45909 Unspecified asthma, uncomplicated: Secondary | ICD-10-CM | POA: Diagnosis not present

## 2023-09-10 DIAGNOSIS — J449 Chronic obstructive pulmonary disease, unspecified: Secondary | ICD-10-CM | POA: Diagnosis not present

## 2023-09-10 DIAGNOSIS — Z20822 Contact with and (suspected) exposure to covid-19: Secondary | ICD-10-CM | POA: Diagnosis not present

## 2023-09-10 DIAGNOSIS — R112 Nausea with vomiting, unspecified: Secondary | ICD-10-CM | POA: Insufficient documentation

## 2023-09-10 DIAGNOSIS — R059 Cough, unspecified: Secondary | ICD-10-CM | POA: Diagnosis not present

## 2023-09-10 DIAGNOSIS — R0789 Other chest pain: Secondary | ICD-10-CM | POA: Insufficient documentation

## 2023-09-10 DIAGNOSIS — K449 Diaphragmatic hernia without obstruction or gangrene: Secondary | ICD-10-CM | POA: Diagnosis not present

## 2023-09-10 DIAGNOSIS — F419 Anxiety disorder, unspecified: Secondary | ICD-10-CM | POA: Diagnosis not present

## 2023-09-10 DIAGNOSIS — R079 Chest pain, unspecified: Secondary | ICD-10-CM | POA: Diagnosis not present

## 2023-09-10 DIAGNOSIS — J439 Emphysema, unspecified: Secondary | ICD-10-CM | POA: Diagnosis not present

## 2023-09-10 DIAGNOSIS — R0602 Shortness of breath: Secondary | ICD-10-CM | POA: Insufficient documentation

## 2023-09-10 LAB — RESP PANEL BY RT-PCR (RSV, FLU A&B, COVID)  RVPGX2
Influenza A by PCR: NEGATIVE
Influenza B by PCR: NEGATIVE
Resp Syncytial Virus by PCR: NEGATIVE
SARS Coronavirus 2 by RT PCR: NEGATIVE

## 2023-09-10 LAB — BASIC METABOLIC PANEL
Anion gap: 14 (ref 5–15)
BUN: 14 mg/dL (ref 6–20)
CO2: 21 mmol/L — ABNORMAL LOW (ref 22–32)
Calcium: 9.2 mg/dL (ref 8.9–10.3)
Chloride: 100 mmol/L (ref 98–111)
Creatinine, Ser: 0.84 mg/dL (ref 0.44–1.00)
GFR, Estimated: >60 mL/min (ref >60)
Glucose, Bld: 115 mg/dL — ABNORMAL HIGH (ref 70–99)
Potassium: 3.7 mmol/L (ref 3.5–5.1)
Sodium: 135 mmol/L (ref 135–145)

## 2023-09-10 LAB — CBC
HCT: 39.1 % (ref 36.0–46.0)
Hemoglobin: 13.1 g/dL (ref 12.0–15.0)
MCH: 32 pg (ref 26.0–34.0)
MCHC: 33.5 g/dL (ref 30.0–36.0)
MCV: 95.6 fL (ref 80.0–100.0)
Platelets: 239 10*3/uL (ref 150–400)
RBC: 4.09 MIL/uL (ref 3.87–5.11)
RDW: 13.7 % (ref 11.5–15.5)
WBC: 13.7 10*3/uL — ABNORMAL HIGH (ref 4.0–10.5)
nRBC: 0 % (ref 0.0–0.2)

## 2023-09-10 LAB — TROPONIN I (HIGH SENSITIVITY)
Troponin I (High Sensitivity): 5 ng/L (ref <18)
Troponin I (High Sensitivity): 6 ng/L (ref <18)

## 2023-09-10 MED ORDER — ONDANSETRON HCL 4 MG PO TABS
4.0000 mg | ORAL_TABLET | Freq: Four times a day (QID) | ORAL | 0 refills | Status: DC
  Filled 2023-09-10 – 2023-10-12 (×2): qty 12, 3d supply, fill #0

## 2023-09-10 MED ORDER — IPRATROPIUM-ALBUTEROL 0.5-2.5 (3) MG/3ML IN SOLN
3.0000 mL | Freq: Once | RESPIRATORY_TRACT | Status: AC
  Administered 2023-09-10: 3 mL via RESPIRATORY_TRACT
  Filled 2023-09-10: qty 3

## 2023-09-10 MED ORDER — LACTATED RINGERS IV BOLUS
1000.0000 mL | Freq: Once | INTRAVENOUS | Status: AC
  Administered 2023-09-10: 1000 mL via INTRAVENOUS

## 2023-09-10 MED ORDER — IOHEXOL 350 MG/ML SOLN
75.0000 mL | Freq: Once | INTRAVENOUS | Status: AC | PRN
  Administered 2023-09-10: 75 mL via INTRAVENOUS

## 2023-09-10 MED ORDER — MORPHINE SULFATE (PF) 4 MG/ML IV SOLN
4.0000 mg | Freq: Once | INTRAVENOUS | Status: AC
  Administered 2023-09-10: 4 mg via INTRAVENOUS
  Filled 2023-09-10: qty 1

## 2023-09-10 MED ORDER — OXYCODONE-ACETAMINOPHEN 5-325 MG PO TABS: 1.0000 | ORAL_TABLET | Freq: Once | ORAL | Status: DC

## 2023-09-10 MED ORDER — ACETAMINOPHEN 325 MG PO TABS
650.0000 mg | ORAL_TABLET | Freq: Once | ORAL | Status: AC
  Administered 2023-09-10: 650 mg via ORAL
  Filled 2023-09-10: qty 2

## 2023-09-10 MED ORDER — OXYCODONE HCL 5 MG PO TABS
5.0000 mg | ORAL_TABLET | Freq: Once | ORAL | Status: AC
  Administered 2023-09-10: 5 mg via ORAL
  Filled 2023-09-10: qty 1

## 2023-09-10 MED ORDER — ONDANSETRON 4 MG PO TBDP
4.0000 mg | ORAL_TABLET | Freq: Once | ORAL | Status: AC
  Administered 2023-09-10: 4 mg via ORAL
  Filled 2023-09-10: qty 1

## 2023-09-10 NOTE — ED Provider Notes (Signed)
[facility] AT [facility] Provider Note   CSN: [id number] Arrival date & time: 09/10/23  6295     History  Chief Complaint  Patient presents with   Chest Pain    [facility]ne [facility] is a 57 y.o. female.  57 y.o. presenting with 2 days of worsening SOB and pleuritic chest pain. Pain is described as pressure and squeezing, worse with deep inspiration. She was recently discharged on 10/9 s/p left knee replacement. 24 hours after discharge she reported feeling SOB and having difficulty breathing. Endorses increased pain on the left leg, worse in the calf that began after her surgery. She denies tobacco abuse or estrogen use. She reports being active at home since her surgery. She has also been very nauseated and has been vomiting the last 24 hours. She has been unable to take her medications that were prescribed at discharge due to vomiting.   PMHx of COPD/asthma which she is on inhalers at home, however she has not taken her inhalers since being hospitalized. She denies fever/chills or recent sick contacts.  The history is provided by the patient. No language interpreter was used.  Chest Pain Associated symptoms: cough, nausea, shortness of breath and vomiting   Associated symptoms: no abdominal pain, no fever and no weakness        Home Medications Prior to Admission medications   Medication Sig Start Date End Date Taking? Authorizing Provider  ondansetron (ZOFRAN) 4 MG tablet Take 1 tablet (4 mg total) by mouth every 6 (six) hours. 09/10/23  Yes Glendale Chard, DO  pantoprazole (PROTONIX) 40 MG tablet Take 1 tablet (40 mg total) by mouth as needed. 06/29/23  Yes Donita Brooks, MD  promethazine (PHENERGAN) 25 MG tablet Take 1 tablet (25 mg total) by mouth every 6 (six) hours as needed for nausea or vomiting. 06/29/23  Yes Donita Brooks, MD  aspirin EC 81 MG tablet Take 1 tablet (81 mg total) by mouth 2 (two) times daily. To be taken after surgery to  prevent blood clots 08/31/23 08/30/24  Cristie Hem, PA-C  docusate sodium (COLACE) 100 MG capsule Take 1 capsule (100 mg total) by mouth daily as needed. 08/31/23 08/30/24  Cristie Hem, PA-C  methocarbamol (ROBAXIN-750) 750 MG tablet Take 1 tablet (750 mg total) by mouth 2 (two) times daily as needed for muscle spasms. 08/31/23   Cristie Hem, PA-C  oxyCODONE-acetaminophen (PERCOCET) 5-325 MG tablet Take 1-2 tablets by mouth every 6 (six) hours as needed. To be taken after surgery 08/31/23   Cristie Hem, PA-C  potassium chloride (K-DUR) 10 MEQ tablet Take 1 tablet (10 mEq total) by mouth daily. 07/07/17 03/27/20  Audry Pili, PA-C      Allergies    Ciprofloxacin    Review of Systems   Review of Systems  Constitutional:  Positive for activity change. Negative for chills and fever.  HENT:  Negative for congestion, rhinorrhea and sore throat.   Respiratory:  Positive for cough, chest tightness and shortness of breath. Negative for wheezing.   Cardiovascular:  Positive for chest pain.  Gastrointestinal:  Positive for constipation, nausea and vomiting. Negative for abdominal pain and diarrhea.  Skin:  Negative for color change.  Neurological:  Negative for syncope, weakness and light-headedness.    Physical Exam Updated Vital Signs BP 119/64 (BP Location: Left Arm)   Pulse 75   Temp 98.3 F (36.8 C) (Oral)   Resp 18   Ht 5\' 6"  (1.676 m)  Wt 93.9 kg   LMP 12/16/2011   SpO2 100%   BMI 33.41 kg/m  Physical Exam Constitutional:      General: She is not in acute distress.    Appearance: She is well-developed. She is obese. She is ill-appearing.  Cardiovascular:     Rate and Rhythm: Normal rate and regular rhythm.     Pulses:          Dorsalis pedis pulses are 2+ on the right side and 2+ on the left side.       Posterior tibial pulses are 2+ on the right side and 2+ on the left side.     Heart sounds: Normal heart sounds. No murmur heard. Pulmonary:     Effort: Pulmonary  effort is normal. No accessory muscle usage or respiratory distress.     Breath sounds: Normal breath sounds.  Chest:     Chest wall: Tenderness present.  Abdominal:     General: Bowel sounds are normal.     Palpations: Abdomen is soft.  Musculoskeletal:     Left lower leg: Tenderness present. Edema present.  Skin:    General: Skin is warm.     Capillary Refill: Capillary refill takes 2 to 3 seconds.     Findings: No erythema.  Neurological:     General: No focal deficit present.     Mental Status: She is alert and oriented to person, place, and time.  Psychiatric:        Mood and Affect: Mood is anxious.        Behavior: Behavior normal.     ED Results / Procedures / Treatments   Labs (all labs ordered are listed, but only abnormal results are displayed) Labs Reviewed  BASIC METABOLIC PANEL - Abnormal; Notable for the following components:      Result Value   CO2 21 (*)    Glucose, Bld 115 (*)    All other components within normal limits  CBC - Abnormal; Notable for the following components:   WBC 13.7 (*)    All other components within normal limits  RESP PANEL BY RT-PCR (RSV, FLU A&B, COVID)  [id number]  TROPONIN I (HIGH SENSITIVITY)  TROPONIN I (HIGH SENSITIVITY)    EKG EKG Interpretation Date/Time:  Friday September 10 2023 07:02:58 EDT Ventricular Rate:  88 PR Interval:  98 QRS Duration:  84 QT Interval:  376 QTC Calculation: 454 R Axis:   68  Text Interpretation: Sinus rhythm with short PR Otherwise normal ECG When compared with ECG of 26-Jul-2023 10:38, PREVIOUS ECG IS PRESENT Confirmed by Gwyneth Sprout ([id number]) on 09/10/2023 8:35:26 AM  Radiology CT ANGIO CHEST PE W OR WO CONTRAST  Result Date: 09/10/2023 CLINICAL DATA:  Pulmonary embolism (PE) suspected, high prob. Total knee replacement on Monday. Now chest pain. EXAM: CT ANGIOGRAPHY CHEST WITH CONTRAST TECHNIQUE: Multidetector CT imaging of the chest was performed using the standard protocol during  bolus administration of intravenous contrast. Multiplanar CT image reconstructions and MIPs were obtained to evaluate the vascular anatomy. RADIATION DOSE REDUCTION: This exam was performed according to the departmental dose-optimization program which includes automated exposure control, adjustment of the mA and/or kV according to patient size and/or use of iterative reconstruction technique. CONTRAST:  75mL OMNIPAQUE IOHEXOL 350 MG/ML SOLN COMPARISON:  None Available. FINDINGS: Cardiovascular: No evidence of embolism to the proximal subsegmental pulmonary artery level. Normal cardiac size. No pericardial effusion. No aortic aneurysm. There is dilation of the main pulmonary trunk measuring up to 3.0 cm,  which is nonspecific but can be seen with pulmonary artery hypertension. Mediastinum/Nodes: Visualized thyroid gland appears grossly unremarkable. No solid / cystic mediastinal masses. The esophagus is nondistended precluding optimal assessment. Note is made of small-to-moderate hiatal hernia. No axillary, mediastinal or hilar lymphadenopathy by size criteria. Lungs/Pleura: The central tracheo-bronchial tree is patent. Minimal upper lobe predominant paraseptal emphysematous changes noted. There are dependent changes throughout bilateral lungs. There are patchy areas of linear, plate-like atelectasis and/or scarring throughout bilateral lungs. No mass or consolidation. No pleural effusion or pneumothorax. No suspicious lung nodules. Upper Abdomen: Stable nodularity of bilateral adrenal glands, unchanged since multiple prior studies dating back to 2012. Remaining visualized upper abdominal viscera within normal limits. Musculoskeletal: The visualized soft tissues of the chest wall are grossly unremarkable. No suspicious osseous lesions. There are mild multilevel degenerative changes in the visualized spine. Review of the MIP images confirms the above findings. IMPRESSION: 1. No evidence of pulmonary embolism to the  proximal subsegmental pulmonary artery level. 2. Dilation of the main pulmonary trunk measuring up to 3.0 cm, which is nonspecific but can be seen with pulmonary artery hypertension. 3. Small-to-moderate hiatal hernia. 4. Multiple other nonacute observations, as described above. Emphysema (ICD10-J43.9). Electronically Signed   By: Jules Schick M.D.   On: 09/10/2023 11:05   DG Chest 2 View  Result Date: 09/10/2023 CLINICAL DATA:  chest pain, sob EXAM: CHEST - 2 VIEW COMPARISON:  06/25/2023. FINDINGS: Bilateral lung fields are clear. Bilateral costophrenic angles are clear. Normal cardio-mediastinal silhouette. No acute osseous abnormalities. The soft tissues are within normal limits. IMPRESSION: No active cardiopulmonary disease. Electronically Signed   By: Jules Schick M.D.   On: 09/10/2023 08:26    Procedures Procedures    Medications Ordered in ED Medications  ondansetron (ZOFRAN-ODT) disintegrating tablet 4 mg (4 mg Oral Given 09/10/23 0712)  ipratropium-albuterol (DUONEB) 0.5-2.5 (3) MG/3ML nebulizer solution 3 mL (3 mLs Nebulization Given 09/10/23 0901)  lactated ringers bolus 1,000 mL (0 mLs Intravenous Stopped 09/10/23 1049)  morphine (PF) 4 MG/ML injection 4 mg (4 mg Intravenous Given 09/10/23 0900)  iohexol (OMNIPAQUE) 350 MG/ML injection 75 mL (75 mLs Intravenous Contrast Given 09/10/23 0944)  ipratropium-albuterol (DUONEB) 0.5-2.5 (3) MG/3ML nebulizer solution 3 mL (3 mLs Nebulization Given 09/10/23 1055)  oxyCODONE (Oxy IR/ROXICODONE) immediate release tablet 5 mg (5 mg Oral Given 09/10/23 1054)  acetaminophen (TYLENOL) tablet 650 mg (650 mg Oral Given 09/10/23 1055)    ED Course/ Medical Decision Making/ A&P                                 Medical Decision Making 57 y.o. female presenting with shortness of breath, chest pain, nausea, vomiting.  On presentation vital signs stable.  On physical exam she is not in respiratory distress but appears uncomfortable.  No  consolidations or wheezing appreciated on pulmonary exam.  Differential for this presentation includes PE, COPD exacerbation, ACS, pneumonia, atelectasis secondary to recent intubation, MSK pain 2/2 to vomiting.  Initial labs CMP and CBC within normal limits.  Troponin negative x2 and EKG similar to prior - less likely to be ACS.  Chest x-ray on independent interpretation appears clear without signs of consolidations or pleural effusions.  With patient's recent history of surgery and Wells score of 7.5 indicating 40.6% chance of PE in ED population will obtain CT a chest to rule out PE.  With patient's history of COPD/asthma being off inhalers also possible  due to COPD exacerbation.  Will obtain viral testing and give DuoNeb.  Due to vomiting pain has not been well-managed at home will attempt oral rehydration after Zofran and give oxycodone for pain. Patient reports that she is unable to tolerate PO liquids, 1 L IVF bolus ordered with IV morphine for pain control.   On reassessment, patient continued to have SOB and was given another DuoNeb. Viral testing negative. CTPE negative for PE or acute cardiopulmonary process. Patient felt improved after second Duoneb and pain medication. Most likely SOB was 2/2 to COPD/asthma d/t her not using her home inhalers. Chest pain most likely secondary to MSK related pain from vomiting and coughing.  She was discharged home with prescription of Zofran for nausea and instructed to follow up outpatient with her PCP.   Amount and/or Complexity of Data Reviewed External Data Reviewed: labs and notes. Labs: ordered. Decision-making details documented in ED Course. Radiology: ordered and independent interpretation performed. Decision-making details documented in ED Course. ECG/medicine tests: ordered and independent interpretation performed. Decision-making details documented in ED Course.  Risk OTC drugs. Prescription drug management.          Final Clinical  Impression(s) / ED Diagnoses Final diagnoses:  SOB (shortness of breath)  Chest wall pain    Rx / DC Orders ED Discharge Orders          Ordered    ondansetron (ZOFRAN) 4 MG tablet  Every 6 hours        09/10/23 1115              Glendale Chard, DO 09/10/23 1120    Gwyneth Sprout, MD 09/16/23 [phone]

## 2023-09-10 NOTE — ED Triage Notes (Signed)
Pt reporting she had a total knee replacement (10/7), onset of chest pain and sob that started yesterday, has become worse through the night. She says she also feels dehydrated.

## 2023-09-10 NOTE — ED Notes (Signed)
Pt placed on 2L [place]

## 2023-09-10 NOTE — ED Notes (Addendum)
PT c/o worsening chest pain that also produces sob and her O2 decreases. When the pain dissipates, her O2 returns to normal. EDP made aware.

## 2023-09-10 NOTE — ED Provider Notes (Signed)
.     Gwyneth Sprout, MD 09/16/23 [phone]

## 2023-09-10 NOTE — ED Notes (Signed)
This phlebotomist was unable to collect labs

## 2023-09-10 NOTE — Discharge Instructions (Addendum)
Your scans and lab work all looked normal. This is good news. More than likely your shortness of breath is due to your COPD/asthma and your chest pain id from vomting and coughing.   Continue working thorugh your physical therapy exercises. You have been given a prescription for nausea medication you can take every 6-8 hours as needed. Please follow up with your outpatient provider.

## 2023-09-13 ENCOUNTER — Telehealth: Payer: Self-pay | Admitting: Orthopaedic Surgery

## 2023-09-13 NOTE — Telephone Encounter (Signed)
Called and gave verbal

## 2023-09-13 NOTE — Telephone Encounter (Signed)
Kim from [facility] called. She would like verbal orders to start home PT 10/22. Her cb# [phone]

## 2023-09-14 ENCOUNTER — Other Ambulatory Visit: Payer: Self-pay

## 2023-09-14 DIAGNOSIS — Z96652 Presence of left artificial knee joint: Secondary | ICD-10-CM

## 2023-09-21 ENCOUNTER — Other Ambulatory Visit: Payer: Self-pay

## 2023-09-21 ENCOUNTER — Encounter: Payer: Self-pay | Admitting: Physician Assistant

## 2023-09-21 ENCOUNTER — Ambulatory Visit (INDEPENDENT_AMBULATORY_CARE_PROVIDER_SITE_OTHER): Payer: Medicaid Other | Admitting: Physician Assistant

## 2023-09-21 ENCOUNTER — Telehealth: Payer: Self-pay | Admitting: Orthopaedic Surgery

## 2023-09-21 ENCOUNTER — Telehealth: Payer: Self-pay

## 2023-09-21 ENCOUNTER — Ambulatory Visit (HOSPITAL_COMMUNITY)
Admission: RE | Admit: 2023-09-21 | Discharge: 2023-09-21 | Disposition: A | Discharge status: Home / Self Care | Payer: Medicaid Other | Source: Ambulatory Visit | Attending: Physician Assistant | Admitting: Physician Assistant

## 2023-09-21 DIAGNOSIS — M7989 Other specified soft tissue disorders: Secondary | ICD-10-CM | POA: Diagnosis not present

## 2023-09-21 DIAGNOSIS — Z96652 Presence of left artificial knee joint: Secondary | ICD-10-CM | POA: Diagnosis not present

## 2023-09-21 DIAGNOSIS — M79662 Pain in left lower leg: Secondary | ICD-10-CM

## 2023-09-21 MED ORDER — METHOCARBAMOL 750 MG PO TABS
750.0000 mg | ORAL_TABLET | Freq: Two times a day (BID) | ORAL | 2 refills | Status: DC | PRN
  Filled 2023-09-21: qty 20, 10d supply, fill #0
  Filled 2023-10-12: qty 20, 10d supply, fill #1

## 2023-09-21 MED ORDER — HYDROCODONE-ACETAMINOPHEN 5-325 MG PO TABS
1.0000 | ORAL_TABLET | Freq: Three times a day (TID) | ORAL | 0 refills | Status: DC | PRN
  Filled 2023-09-21: qty 30, 5d supply, fill #0

## 2023-09-21 NOTE — Telephone Encounter (Signed)
I would recommend heat/ice

## 2023-09-21 NOTE — Progress Notes (Signed)
Post-Op Visit Note   Patient: [facility]           Date of Birth: 1966/02/15           MRN: [id number] Visit Date: 09/21/2023 PCP: [facility], MD   Assessment & Plan:  Chief Complaint:  Chief Complaint  Patient presents with   Left Knee - Follow-up    Left total knee arthroplasty 09/06/2023   Visit Diagnoses:  1. Status post total left knee replacement     Plan: Patient is a pleasant 57 year old female who comes in today 2 weeks status post left total knee replacement 09/06/2023.  She has been doing okay.  Her main complaint has been a charley horse feeling to the left calf.  She has been compliant taking a baby aspirin twice daily for DVT prophylaxis.  She has been unable to use the compression stockings as this makes her calf pain worse.  She has been taking Robaxin as needed for pain.  She has been getting home health physical therapy and is ambulating with a walker.  She is scheduled to start outpatient physical therapy this Thursday.  Examination of her left knee reveals a well-healing surgical incision with nylon sutures in place.  No evidence of infection or cellulitis.  Calves are soft but tender.  She does have ecchymosis throughout.  No pain with Homans.  She is neurovascularly intact distally.  Today, sutures were removed and Steri-Strips applied.  Postoperative instructions provided.  I have ordered an ultrasound of the left lower extremity to rule out DVT.  For now, she will continue taking her baby aspirin twice daily for DVT prophylaxis for another 4 weeks.  I sent in Norco and Robaxin.  Follow-up in 4 weeks for repeat evaluation and 2 view x-rays of the left knee.  Call with concerns or questions.  Follow-Up Instructions: Return in about 4 weeks (around 10/19/2023).   Orders:  No orders of the defined types were placed in this encounter.  Meds ordered this encounter  Medications   methocarbamol (ROBAXIN-750) 750 MG tablet    Sig: Take 1 tablet (750 mg total)  by mouth 2 (two) times daily as needed for muscle spasms.    Dispense:  20 tablet    Refill:  2   HYDROcodone-acetaminophen (NORCO) 5-325 MG tablet    Sig: Take 1-2 tablets by mouth 3 (three) times daily as needed.    Dispense:  30 tablet    Refill:  0    Imaging: No new imaging  PMFS History: Patient Active Problem List   Diagnosis Date Noted   Status post total left knee replacement 09/06/2023   Primary osteoarthritis of left knee 09/05/2023   S/P vaginal hysterectomy 10/02/2020   Uterine prolapse 09/04/2020   Dyspareunia due to medical condition in female 09/04/2020   Urinary retention 09/04/2020   Influenza B 01/29/2016   Influenza 01/29/2016   Status asthmaticus    Bipolar affective disorder, currently manic, moderate (HCC)    Acute bronchitis with asthma with acute exacerbation 01/26/2016   Acute bronchitis with bronchospasm 01/26/2016   Bronchitis with bronchospasm    Costochondritis, acute    Intractable nausea and vomiting 09/19/2015   Hyperglycemia 09/19/2015   Leukocytosis 09/19/2015   Thrombocytopenia (HCC) 09/19/2015   Diarrhea 09/19/2015   Obesity 09/19/2015   Thyroid disease    Depression    Past Medical History:  Diagnosis Date   Anemia    Anxiety    Arthritis    knees  Asthma    Basal cell carcinoma of neck    "front of my neck; it was a melanoma"   Bipolar disorder (HCC)    COPD (chronic obstructive pulmonary disease) (HCC)    Daily headache    Depression    Family history of adverse reaction to anesthesia    MOM-HARD TO WAKE UP   Fibromyalgia    GERD (gastroesophageal reflux disease)    Heart murmur    H/O   History of hiatal hernia    SMALL   Intractable nausea and vomiting    Migraine    "probably twice/month" (09/19/2015)   Obesity    PTSD (post-traumatic stress disorder)    Thyroid disease     Family History  Problem Relation Age of Onset   Heart attack Mother    CAD Mother    Breast cancer Sister    Hypertension Other         sibling   Diabetes Other        sibling   Colon cancer Neg Hx    Colon polyps Neg Hx    Esophageal cancer Neg Hx    Rectal cancer Neg Hx    Stomach cancer Neg Hx     Past Surgical History:  Procedure Laterality Date   ABDOMINAL HYSTERECTOMY     CYSTOCELE REPAIR N/A 09/24/2020   Procedure: ANTERIOR REPAIR (CYSTOCELE);  Surgeon: Nadara Mustard, MD;  Location: [facility];  Service: Gynecology;  Laterality: N/A;   MOLE REMOVAL  ~ 2013   "front side of my neck"   MULTIPLE TOOTH EXTRACTIONS  ~ 2011   TOTAL KNEE ARTHROPLASTY Left 09/06/2023   Procedure: LEFT TOTAL KNEE ARTHROPLASTY;  Surgeon: Tarry Kos, MD;  Location: [facility];  Service: Orthopedics;  Laterality: Left;   VAGINAL HYSTERECTOMY Bilateral 09/24/2020   Procedure: HYSTERECTOMY VAGINAL BILATERAL SALPINGO-OOPHERECTOMY;  Surgeon: Nadara Mustard, MD;  Location: [facility] OR[facility];  Service: Gynecology;  Laterality: Bilateral;   Social History   Occupational History   Not on file  Tobacco Use   Smoking status: Former    Current packs/day: 0.00    Average packs/day: 0.5 packs/day for 28.0 years (14.0 ttl pk-yrs)    Types: Cigarettes    Start date: 09/17/1985    Quit date: 09/17/2013    Years since quitting: 10.0   Smokeless tobacco: Never   Tobacco comments:    "quit smoking ~ 2014"  Vaping Use   Vaping status: Never Used  Substance and Sexual Activity   Alcohol use: No   Drug use: Not Currently    Types: Marijuana    Comment: 09/19/2015 "probably twice/yr":   Sexual activity: Not Currently

## 2023-09-21 NOTE — Telephone Encounter (Signed)
Called and notified patient

## 2023-09-21 NOTE — Telephone Encounter (Signed)
[facility] called stating that patient is Negative for DVT, left LE and possible hematoma behind her left knee.  Please advise.  Thank you.

## 2023-09-21 NOTE — Telephone Encounter (Signed)
Will from [facility] called and wants to report that the patient is refusing treatment. ZO#[phone]

## 2023-09-21 NOTE — Telephone Encounter (Signed)
That is ok.  She tells me she is starting oppt thursday

## 2023-09-22 ENCOUNTER — Other Ambulatory Visit: Payer: Self-pay

## 2023-09-22 NOTE — Therapy (Deleted)
OUTPATIENT PHYSICAL THERAPY LOWER EXTREMITY EVALUATION   Patient Name: Calirae Tienda MRN: [id number] DOB:1966/09/14, 57 y.o., female Today's Date: 09/22/2023  END OF SESSION:   Past Medical History:  Diagnosis Date   Anemia    Anxiety    Arthritis    knees   Asthma    Basal cell carcinoma of neck    "front of my neck; it was a melanoma"   Bipolar disorder (HCC)    COPD (chronic obstructive pulmonary disease) (HCC)    Daily headache    Depression    Family history of adverse reaction to anesthesia    MOM-HARD TO WAKE UP   Fibromyalgia    GERD (gastroesophageal reflux disease)    Heart murmur    H/O   History of hiatal hernia    SMALL   Intractable nausea and vomiting    Migraine    "probably twice/month" (09/19/2015)   Obesity    PTSD (post-traumatic stress disorder)    Thyroid disease    Past Surgical History:  Procedure Laterality Date   ABDOMINAL HYSTERECTOMY     CYSTOCELE REPAIR N/A 09/24/2020   Procedure: ANTERIOR REPAIR (CYSTOCELE);  Surgeon: Nadara Mustard, MD;  Location: [facility];  Service: Gynecology;  Laterality: N/A;   MOLE REMOVAL  ~ 2013   "front side of my neck"   MULTIPLE TOOTH EXTRACTIONS  ~ 2011   TOTAL KNEE ARTHROPLASTY Left 09/06/2023   Procedure: LEFT TOTAL KNEE ARTHROPLASTY;  Surgeon: Tarry Kos, MD;  Location: [facility];  Service: Orthopedics;  Laterality: Left;   VAGINAL HYSTERECTOMY Bilateral 09/24/2020   Procedure: HYSTERECTOMY VAGINAL BILATERAL SALPINGO-OOPHERECTOMY;  Surgeon: Nadara Mustard, MD;  Location: [facility];  Service: Gynecology;  Laterality: Bilateral;   Patient Active Problem List   Diagnosis Date Noted   Status post total left knee replacement 09/06/2023   Primary osteoarthritis of left knee 09/05/2023   S/P vaginal hysterectomy 10/02/2020   Uterine prolapse 09/04/2020   Dyspareunia due to medical condition in female 09/04/2020   Urinary retention 09/04/2020   Influenza B 01/29/2016   Influenza 01/29/2016   Status  asthmaticus    Bipolar affective disorder, currently manic, moderate (HCC)    Acute bronchitis with asthma with acute exacerbation 01/26/2016   Acute bronchitis with bronchospasm 01/26/2016   Bronchitis with bronchospasm    Costochondritis, acute    Intractable nausea and vomiting 09/19/2015   Hyperglycemia 09/19/2015   Leukocytosis 09/19/2015   Thrombocytopenia (HCC) 09/19/2015   Diarrhea 09/19/2015   Obesity 09/19/2015   Thyroid disease    Depression     PCP: ***  REFERRING PROVIDER: ***  REFERRING DIAG: ***  THERAPY DIAG:  No diagnosis found.  Rationale for Evaluation and Treatment: {HABREHAB:27488}  ONSET DATE: ***  SUBJECTIVE:   SUBJECTIVE STATEMENT: ***  PERTINENT HISTORY: *** PAIN:  Are you having pain? {OPRCPAIN:27236}  PRECAUTIONS: {Therapy precautions:24002}  RED FLAGS: {PT Red Flags:29287}   WEIGHT BEARING RESTRICTIONS: {Yes ***/No:24003}  FALLS:  Has patient fallen in last 6 months? {fallsyesno:27318}  LIVING ENVIRONMENT: Lives with: {OPRC lives with:25569::"lives with their family"} Lives in: {Lives in:25570} Stairs: {opstairs:27293} Has following equipment at home: {Assistive devices:23999}  OCCUPATION: ***  PLOF: {PLOF:24004}  PATIENT GOALS: ***  NEXT MD VISIT: ***  OBJECTIVE:  Note: Objective measures were completed at Evaluation unless otherwise noted.  DIAGNOSTIC FINDINGS: ***  PATIENT SURVEYS:  {rehab surveys:24030}  COGNITION: Overall cognitive status: {cognition:24006}     SENSATION: {sensation:27233}  EDEMA:  {edema:24020}  MUSCLE LENGTH: Hamstrings: Right ***  deg; Left *** deg Maisie Fus test: Right *** deg; Left *** deg  POSTURE: {posture:25561}  PALPATION: ***  LOWER EXTREMITY ROM:  {AROM/PROM:27142} ROM Right eval Left eval  Hip flexion    Hip extension    Hip abduction    Hip adduction    Hip internal rotation    Hip external rotation    Knee flexion    Knee extension    Ankle dorsiflexion     Ankle plantarflexion    Ankle inversion    Ankle eversion     (Blank rows = not tested)  LOWER EXTREMITY MMT:  MMT Right eval Left eval  Hip flexion    Hip extension    Hip abduction    Hip adduction    Hip internal rotation    Hip external rotation    Knee flexion    Knee extension    Ankle dorsiflexion    Ankle plantarflexion    Ankle inversion    Ankle eversion     (Blank rows = not tested)  LOWER EXTREMITY SPECIAL TESTS:  {LEspecialtests:26242}  FUNCTIONAL TESTS:  {Functional tests:24029}  GAIT: Distance walked: *** Assistive device utilized: {Assistive devices:23999} Level of assistance: {Levels of assistance:24026} Comments: ***   TODAY'S TREATMENT:                                                                                                                              DATE: ***    PATIENT EDUCATION:  Education details: *** Person educated: {Person educated:25204} Education method: {Education Method:25205} Education comprehension: {Education Comprehension:25206}  HOME EXERCISE PROGRAM: ***  ASSESSMENT:  CLINICAL IMPRESSION: Patient is a *** y.o. *** who was seen today for physical therapy evaluation and treatment for ***.   OBJECTIVE IMPAIRMENTS: {opptimpairments:25111}.   ACTIVITY LIMITATIONS: {activitylimitations:27494}  PARTICIPATION LIMITATIONS: {participationrestrictions:25113}  PERSONAL FACTORS: {Personal factors:25162} are also affecting patient's functional outcome.   REHAB POTENTIAL: {rehabpotential:25112}  CLINICAL DECISION MAKING: {clinical decision making:25114}  EVALUATION COMPLEXITY: {Evaluation complexity:25115}   GOALS: Goals reviewed with patient? {yes/no:20286}  SHORT TERM GOALS: Target date: *** *** Baseline: Goal status: INITIAL  2.  *** Baseline:  Goal status: INITIAL  3.  *** Baseline:  Goal status: INITIAL  4.  *** Baseline:  Goal status: INITIAL  5.  *** Baseline:  Goal status: INITIAL  6.   *** Baseline:  Goal status: INITIAL  LONG TERM GOALS: Target date: ***  *** Baseline:  Goal status: INITIAL  2.  *** Baseline:  Goal status: INITIAL  3.  *** Baseline:  Goal status: INITIAL  4.  *** Baseline:  Goal status: INITIAL  5.  *** Baseline:  Goal status: INITIAL  6.  *** Baseline:  Goal status: INITIAL   PLAN:  PT FREQUENCY: {rehab frequency:25116}  PT DURATION: {rehab duration:25117}  PLANNED INTERVENTIONS: {rehab planned interventions:25118::"97110-Therapeutic exercises","97530- Therapeutic [phone]- Neuromuscular re-education","97535- Self VFIE","33295- Manual therapy"}  PLAN FOR NEXT SESSION: ***   Kaimana Neuzil, PT 09/22/2023, 1:08 PM

## 2023-09-23 ENCOUNTER — Ambulatory Visit: Payer: Medicaid Other | Attending: Orthopaedic Surgery | Admitting: Physical Therapy

## 2023-09-23 DIAGNOSIS — R600 Localized edema: Secondary | ICD-10-CM | POA: Insufficient documentation

## 2023-09-23 DIAGNOSIS — M25662 Stiffness of left knee, not elsewhere classified: Secondary | ICD-10-CM | POA: Insufficient documentation

## 2023-09-23 DIAGNOSIS — Z9889 Other specified postprocedural states: Secondary | ICD-10-CM | POA: Insufficient documentation

## 2023-09-27 ENCOUNTER — Other Ambulatory Visit: Payer: Self-pay

## 2023-09-28 ENCOUNTER — Ambulatory Visit: Payer: Medicaid Other | Admitting: Physical Therapy

## 2023-09-28 ENCOUNTER — Encounter: Payer: Self-pay | Admitting: Physical Therapy

## 2023-09-28 DIAGNOSIS — Z9889 Other specified postprocedural states: Secondary | ICD-10-CM

## 2023-09-28 DIAGNOSIS — M25662 Stiffness of left knee, not elsewhere classified: Secondary | ICD-10-CM

## 2023-09-28 DIAGNOSIS — R600 Localized edema: Secondary | ICD-10-CM | POA: Diagnosis not present

## 2023-09-28 NOTE — Therapy (Signed)
OUTPATIENT PHYSICAL THERAPY LOWER EXTREMITY EVALUATION   Patient Name: Adrienne Garcia MRN: [id number] DOB:[birth year], 57 y.o., female Today's Date: 09/28/2023  END OF SESSION:  PT End of Session - 09/28/23 1058     Visit Number 1    Number of Visits 12    Date for PT Re-Evaluation 11/09/23    Authorization Type [employer]    PT Start Time 1100    PT Stop Time 1140    PT Time Calculation (min) 40 min    Behavior During Therapy WFL for tasks assessed/performed             Past Medical History:  Diagnosis Date   Anemia    Anxiety    Arthritis    knees   Asthma    Basal cell carcinoma of neck    "front of my neck; it was a melanoma"   Bipolar disorder (HCC)    COPD (chronic obstructive pulmonary disease) (HCC)    Daily headache    Depression    Family history of adverse reaction to anesthesia    MOM-HARD TO WAKE UP   Fibromyalgia    GERD (gastroesophageal reflux disease)    Heart murmur    H/O   History of hiatal hernia    SMALL   Intractable nausea and vomiting    Migraine    "probably twice/month" (09/19/2015)   Obesity    PTSD (post-traumatic stress disorder)    Thyroid disease    Past Surgical History:  Procedure Laterality Date   ABDOMINAL HYSTERECTOMY     CYSTOCELE REPAIR N/A 09/24/2020   Procedure: ANTERIOR REPAIR (CYSTOCELE);  Surgeon: Nadara Mustard, MD;  Location: [facility];  Service: Gynecology;  Laterality: N/A;   MOLE REMOVAL  ~ 2013   "front side of my neck"   MULTIPLE TOOTH EXTRACTIONS  ~ 2011   TOTAL KNEE ARTHROPLASTY Left 09/06/2023   Procedure: LEFT TOTAL KNEE ARTHROPLASTY;  Surgeon: Tarry Kos, MD;  Location: [facility];  Service: Orthopedics;  Laterality: Left;   VAGINAL HYSTERECTOMY Bilateral 09/24/2020   Procedure: HYSTERECTOMY VAGINAL BILATERAL SALPINGO-OOPHERECTOMY;  Surgeon: Nadara Mustard, MD;  Location: [facility];  Service: Gynecology;  Laterality: Bilateral;   Patient Active Problem List   Diagnosis Date Noted   Status  post total left knee replacement 09/06/2023   Primary osteoarthritis of left knee 09/05/2023   S/P vaginal hysterectomy 10/02/2020   Uterine prolapse 09/04/2020   Dyspareunia due to medical condition in female 09/04/2020   Urinary retention 09/04/2020   Influenza B 01/29/2016   Influenza 01/29/2016   Status asthmaticus    Bipolar affective disorder, currently manic, moderate (HCC)    Acute bronchitis with asthma with acute exacerbation 01/26/2016   Acute bronchitis with bronchospasm 01/26/2016   Bronchitis with bronchospasm    Costochondritis, acute    Intractable nausea and vomiting 09/19/2015   Hyperglycemia 09/19/2015   Leukocytosis 09/19/2015   Thrombocytopenia (HCC) 09/19/2015   Diarrhea 09/19/2015   Obesity 09/19/2015   Thyroid disease    Depression     PCP: Lynnea Ferrier MD   REFERRING PROVIDER: Gershon Mussel MD   REFERRING DIAG:  Diagnosis  [phone] (ICD-10-CM) - S/P total knee arthroplasty, left    THERAPY DIAG:  S/P left knee arthroscopy  Stiffness of left knee, not elsewhere classified  Localized edema  Rationale for Evaluation and Treatment: Rehabilitation  ONSET DATE: 09/06/23  SUBJECTIVE:   SUBJECTIVE STATEMENT: Pt underwent TKA on 09/06/23.  Overall the patient is doing quite well  she had about 4 visits of home health physical therapy and has been diligent about doing her exercises.  She has tenderness, swelling and pain in her L knee, anterior/,medial. 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 Pt reports difficulty with walking and bending her knee.  She arrives without assistive device reports she stopped using her cane a couple of days ago.  PERTINENT HISTORY: COPD, PTSD, anxiety, depression   PAIN:  Are you having pain? Yes: NPRS scale: 5/10 Pain location: Rt knee anterior  Pain description: aching, tight , sharp medially  Aggravating factors: walking, bending it  Relieving factors: meds, cold packs She has muscle spasms "comes and goes",  2-3 x per day.    PRECAUTIONS: None  RED FLAGS: None   WEIGHT BEARING RESTRICTIONS: No  FALLS:  Has patient fallen in last 6 months? No  LIVING ENVIRONMENT: Lives with: lives with their partnerboyfriend Lives in: House/apartment trailer  Stairs: Yes: External: 6 steps; on right going up Has following equipment at home: Single point cane and Walker - 2 wheeled  OCCUPATION: not working  PLOF: Independent  PATIENT GOALS: I want to drive again, walking further   NEXT MD VISIT: 4 weeks   OBJECTIVE:  Note: Objective measures were completed at Evaluation unless otherwise noted.  DIAGNOSTIC FINDINGS: post op  PATIENT SURVEYS:  FOTO 42% goal is 56%  COGNITION: Overall cognitive status: Within functional limits for tasks assessed     SENSATION: WFL  EDEMA:  Circumferential: Rt 43 cm, Lt. 47 cm    PALPATION: Warm, not red along well incision on LLE   LOWER EXTREMITY ROM:  Active ROM Right eval Left eval  Hip flexion    Hip extension    Hip abduction    Hip adduction    Hip internal rotation    Hip external rotation    Knee flexion  90 AROM, able to get to 97 deg with AAROM   Knee extension  -18 deg   Ankle dorsiflexion    Ankle plantarflexion    Ankle inversion    Ankle eversion     (Blank rows = not tested)  LOWER EXTREMITY MMT:  MMT Right eval Left eval  Hip flexion 4 3+  Hip extension    Hip abduction    Hip adduction    Hip internal rotation    Hip external rotation    Knee flexion 5 4- pain   Knee extension 5 4- pain       Ankle dorsiflexion 5 5  Ankle plantarflexion    Ankle inversion    Ankle eversion     (Blank rows = not tested)  LOWER EXTREMITY SPECIAL TESTS:  NT   FUNCTIONAL TESTS:  5 times sit to stand: 22 sec    GAIT: Distance walked: 150 Assistive device utilized: None Level of assistance: Modified independence Comments: antalgic gait, no device, knee flexion   TODAY'S TREATMENT:  DATE: 09/28/23  Knee HEP given see below    PATIENT EDUCATION:  Education details: PT,POC, HEP, swelling mgmt, gait  Person educated: Patient Education method: Explanation, Demonstration, and Handouts Education comprehension: verbalized understanding, returned demonstration, and needs further education  HOME EXERCISE PROGRAM: Access Code: [email or web address] URL: [email or web address] Date: 09/28/2023 Prepared by: Karie Mainland  Exercises - Gastroc Stretch on Wall  - 1-3 x daily - 7 x weekly - 1 sets - 5 reps - 30 hold - Supine Hamstring Stretch with Strap  - 1-3 x daily - 7 x weekly - 1 sets - 5 reps - 30 hold - Supine Heel Slide with Strap  - 1-3 x daily - 7 x weekly - 1 sets - 5-10 reps - 30 hold - Seated Long Arc Quad  - 1-3 x daily - 7 x weekly - 2 sets - 10 reps - 5 hold - Supine Quad Set  - 1-3 x daily - 7 x weekly - 2 sets - 10 reps - 5 hold - Active Straight Leg Raise with Quad Set  - 1-3 x daily - 7 x weekly - 2 sets - 10 reps - 5 hold  ASSESSMENT:  CLINICAL IMPRESSION: Patient is a 56y.o. female who was seen today for physical therapy evaluation and treatment for left total knee replacement.   OBJECTIVE IMPAIRMENTS: Abnormal gait, decreased activity tolerance, decreased knowledge of use of DME, decreased mobility, difficulty walking, decreased ROM, decreased strength, hypomobility, increased edema, increased fascial restrictions, impaired flexibility, obesity, and pain.   ACTIVITY LIMITATIONS: lifting, bending, standing, squatting, sleeping, stairs, transfers, and locomotion level  PARTICIPATION LIMITATIONS: meal prep, cleaning, interpersonal relationship, driving, shopping, and community activity  PERSONAL FACTORS: 1-2 comorbidities: fibromyalgia, Rt knee pain   are also affecting patient's functional outcome.   REHAB POTENTIAL: Excellent  CLINICAL DECISION MAKING:  Stable/uncomplicated  EVALUATION COMPLEXITY: Low   GOALS: Goals reviewed with patient? Yes   LONG TERM GOALS: Target date: 11/09/2023    Pt will be able to show independence with final HEP for knee range of motion and strength Baseline: Unknown to patient has a basic home program Goal status: INITIAL  2.  Patient will be able to walk without assistive device and no increased pain distances 1000 feet or more Baseline: 150 feet, increased pain and significant limp Goal status: INITIAL  3.  Patient will be able to extend knee to lacking no more than 5 degrees for improved heel strike with ambulation Baseline: -18 deg Goal status: INITIAL  4.  Patient be able to bend knee to 120 degrees for optimal transfer mechanics and mobility Baseline: 97 deg AAROM  Goal status: INITIAL  5.  Patient will be able to perform sit to stand transfer without use of hands and 13 seconds or less Baseline: 22 sec Goal status: INITIAL  6.  2 min walk test TBA  Baseline:  Goal status: INITIAL   PLAN:  PT FREQUENCY: 2x/week  PT DURATION: 6 weeks  PLANNED INTERVENTIONS: 97164- PT Re-evaluation, 97110-Therapeutic exercises, 97530- Therapeutic activity, 97112- Neuromuscular re-education, 97535- Self Care, 81191- Manual therapy, 97116- Gait training, 97014- Electrical stimulation (unattended), 97016- Vasopneumatic device, Patient/Family education, Balance training, Taping, and Joint mobilization  PLAN FOR NEXT SESSION: Check HEP, try NuStep, 2-minute walk test, vaso.  Include tape, manual therapy and ambulation/gait training   Dali Kraner, PT 09/28/2023, 4:01 PM   Karie Mainland, PT 09/28/23 4:11 PM Phone: [phone] Fax: [phone]    [facility] Authorization   Choose one: Rehabilitative  Standardized Assessment or Functional  Outcome Tool: See Pain Assessment and Other FOTO   Score or Percent Disability: 42% able   Body Parts Treated (Select each separately):  Knee. Overall  deficits/functional limitations for body part selected: moderate N/A. Overall deficits/functional limitations for body part selected: NA N/A. Overall deficits/functional limitations for body part selected: NA   If treatment provided at initial evaluation, no treatment charged due to lack of authorization.    Karie Mainland, PT 09/28/23 4:12 PM Phone: [phone] Fax: [phone]

## 2023-09-30 ENCOUNTER — Ambulatory Visit: Payer: Medicaid Other | Admitting: Physical Therapy

## 2023-09-30 ENCOUNTER — Encounter: Payer: Self-pay | Admitting: Physical Therapy

## 2023-09-30 DIAGNOSIS — M25662 Stiffness of left knee, not elsewhere classified: Secondary | ICD-10-CM | POA: Diagnosis not present

## 2023-09-30 DIAGNOSIS — Z9889 Other specified postprocedural states: Secondary | ICD-10-CM | POA: Diagnosis not present

## 2023-09-30 DIAGNOSIS — R600 Localized edema: Secondary | ICD-10-CM

## 2023-09-30 NOTE — Therapy (Signed)
OUTPATIENT PHYSICAL THERAPY LOWER EXTREMITY TREATMENT  Patient Name: Adrienne Garcia MRN: [id number] DOB:Jul 22, 1966, 57 y.o., female Today's Date: 09/30/2023  END OF SESSION:  PT End of Session - 09/30/23 1018     Visit Number 2    Number of Visits 12    Date for PT Re-Evaluation 11/09/23    Authorization Type [place] [employer]    PT Start Time 1015    PT Stop Time 1103    PT Time Calculation (min) 48 min    Activity Tolerance Patient tolerated treatment well;Patient limited by pain    Behavior During Therapy WFL for tasks assessed/performed              Past Medical History:  Diagnosis Date   Anemia    Anxiety    Arthritis    knees   Asthma    Basal cell carcinoma of neck    "front of my neck; it was a melanoma"   Bipolar disorder (HCC)    COPD (chronic obstructive pulmonary disease) (HCC)    Daily headache    Depression    Family history of adverse reaction to anesthesia    MOM-HARD TO WAKE UP   Fibromyalgia    GERD (gastroesophageal reflux disease)    Heart murmur    H/O   History of hiatal hernia    SMALL   Intractable nausea and vomiting    Migraine    "probably twice/month" (09/19/2015)   Obesity    PTSD (post-traumatic stress disorder)    Thyroid disease    Past Surgical History:  Procedure Laterality Date   ABDOMINAL HYSTERECTOMY     CYSTOCELE REPAIR N/A 09/24/2020   Procedure: ANTERIOR REPAIR (CYSTOCELE);  Surgeon: Nadara Mustard, MD;  Location: [facility];  Service: Gynecology;  Laterality: N/A;   MOLE REMOVAL  ~ 2013   "front side of my neck"   MULTIPLE TOOTH EXTRACTIONS  ~ 2011   TOTAL KNEE ARTHROPLASTY Left 09/06/2023   Procedure: LEFT TOTAL KNEE ARTHROPLASTY;  Surgeon: Tarry Kos, MD;  Location: [facility];  Service: Orthopedics;  Laterality: Left;   VAGINAL HYSTERECTOMY Bilateral 09/24/2020   Procedure: HYSTERECTOMY VAGINAL BILATERAL SALPINGO-OOPHERECTOMY;  Surgeon: Nadara Mustard, MD;  Location: [facility] OR[facility];  Service: Gynecology;  Laterality:  Bilateral;   Patient Active Problem List   Diagnosis Date Noted   Status post total left knee replacement 09/06/2023   Primary osteoarthritis of left knee 09/05/2023   S/P vaginal hysterectomy 10/02/2020   Uterine prolapse 09/04/2020   Dyspareunia due to medical condition in female 09/04/2020   Urinary retention 09/04/2020   Influenza B 01/29/2016   Influenza 01/29/2016   Status asthmaticus    Bipolar affective disorder, currently manic, moderate (HCC)    Acute bronchitis with asthma with acute exacerbation 01/26/2016   Acute bronchitis with bronchospasm 01/26/2016   Bronchitis with bronchospasm    Costochondritis, acute    Intractable nausea and vomiting 09/19/2015   Hyperglycemia 09/19/2015   Leukocytosis 09/19/2015   Thrombocytopenia (HCC) 09/19/2015   Diarrhea 09/19/2015   Obesity 09/19/2015   Thyroid disease    Depression     PCP: Lynnea Ferrier MD   REFERRING PROVIDER: Gershon Mussel MD   REFERRING DIAG:  Diagnosis  [phone] (ICD-10-CM) - S/P total knee arthroplasty, left    THERAPY DIAG:  S/P left knee arthroscopy  Stiffness of left knee, not elsewhere classified  Localized edema  Rationale for Evaluation and Treatment: Rehabilitation  ONSET DATE: 09/06/23  SUBJECTIVE:   SUBJECTIVE STATEMENT: I  have a migraine today.  Pain in knee is 5/10.  Mostly the back of the knee.   PERTINENT HISTORY: COPD, PTSD, anxiety, depression   PAIN:  Are you having pain? Yes: NPRS scale: 5/10 Pain location: Rt knee anterior  Pain description: aching, tight , sharp medially  Aggravating factors: walking, bending it  Relieving factors: meds, cold packs She has muscle spasms "comes and goes", 2-3 x per day.    PRECAUTIONS: None  RED FLAGS: None   WEIGHT BEARING RESTRICTIONS: No  FALLS:  Has patient fallen in last 6 months? No  LIVING ENVIRONMENT: Lives with: lives with their partnerboyfriend Lives in: House/apartment trailer  Stairs: Yes: External: 6 steps; on  right going up Has following equipment at home: Single point cane and Walker - 2 wheeled  OCCUPATION: not working  PLOF: Independent  PATIENT GOALS: I want to drive again, walking further   NEXT MD VISIT: 4 weeks   OBJECTIVE:  Note: Objective measures were completed at Evaluation unless otherwise noted.  DIAGNOSTIC FINDINGS: post op  PATIENT SURVEYS:  FOTO 42% goal is 56%  COGNITION: Overall cognitive status: Within functional limits for tasks assessed     SENSATION: WFL  EDEMA:  Circumferential: Rt 43 cm, Lt. 47 cm    PALPATION: Warm, not red along well incision on LLE   LOWER EXTREMITY ROM:  Active ROM Right eval Left eval  Hip flexion    Hip extension    Hip abduction    Hip adduction    Hip internal rotation    Hip external rotation    Knee flexion  90 AROM, able to get to 97 deg with AAROM   Knee extension  -18 deg   Ankle dorsiflexion    Ankle plantarflexion    Ankle inversion    Ankle eversion     (Blank rows = not tested)  LOWER EXTREMITY MMT:  MMT Right eval Left eval  Hip flexion 4 3+  Hip extension    Hip abduction    Hip adduction    Hip internal rotation    Hip external rotation    Knee flexion 5 4- pain   Knee extension 5 4- pain       Ankle dorsiflexion 5 5  Ankle plantarflexion    Ankle inversion    Ankle eversion     (Blank rows = not tested)  LOWER EXTREMITY SPECIAL TESTS:  NT   FUNCTIONAL TESTS:  5 times sit to stand: 22 sec    GAIT: Distance walked: 150 Assistive device utilized: None Level of assistance: Modified independence Comments: antalgic gait, no device, knee flexion   TODAY'S TREATMENT:                                                                                                                              DATE: 09/28/23 [facility] Adult PT Treatment:  DATE: 09/30/23 Therapeutic Exercise: NuStep LE and UE for 8 min  Wall stretch for calf 30 sec x 3  Seated  post knee stretch with strap, leg propped on step Seated knee flexion AROM, resisted green band anchored with PT  LAQ x 15, 5 sec  SAQ x 15 3 lbs  Quad set done on both sides  SLR x 10 L Hamstring set x 10  Modalities: Vaso 10 min coldest setting and mod pressure    Knee HEP given see below    PATIENT EDUCATION:  Education details: PT,POC, HEP, swelling mgmt, gait  Person educated: Patient Education method: Explanation, Demonstration, and Handouts Education comprehension: verbalized understanding, returned demonstration, and needs further education  HOME EXERCISE PROGRAM: Access Code: [email or web address] URL: [email or web address] Date: 09/28/2023 Prepared by: Karie Mainland  Exercises - Gastroc Stretch on Wall  - 1-3 x daily - 7 x weekly - 1 sets - 5 reps - 30 hold - Supine Hamstring Stretch with Strap  - 1-3 x daily - 7 x weekly - 1 sets - 5 reps - 30 hold - Supine Heel Slide with Strap  - 1-3 x daily - 7 x weekly - 1 sets - 5-10 reps - 30 hold - Seated Long Arc Quad  - 1-3 x daily - 7 x weekly - 2 sets - 10 reps - 5 hold - Supine Quad Set  - 1-3 x daily - 7 x weekly - 2 sets - 10 reps - 5 hold - Active Straight Leg Raise with Quad Set  - 1-3 x daily - 7 x weekly - 2 sets - 10 reps - 5 hold  ASSESSMENT:  CLINICAL IMPRESSION: Patient tolerated session well today.  She was able to show good effort with exercises and was able to flex to 103 deg. Cont POC.   OBJECTIVE IMPAIRMENTS: Abnormal gait, decreased activity tolerance, decreased knowledge of use of DME, decreased mobility, difficulty walking, decreased ROM, decreased strength, hypomobility, increased edema, increased fascial restrictions, impaired flexibility, obesity, and pain.   ACTIVITY LIMITATIONS: lifting, bending, standing, squatting, sleeping, stairs, transfers, and locomotion level  PARTICIPATION LIMITATIONS: meal prep, cleaning, interpersonal relationship, driving, shopping, and community activity  PERSONAL  FACTORS: 1-2 comorbidities: fibromyalgia, Rt knee pain   are also affecting patient's functional outcome.   REHAB POTENTIAL: Excellent  CLINICAL DECISION MAKING: Stable/uncomplicated  EVALUATION COMPLEXITY: Low   GOALS: Goals reviewed with patient? Yes   LONG TERM GOALS: Target date: 11/09/2023    Pt will be able to show independence with final HEP for knee range of motion and strength Baseline: Unknown to patient has a basic home program Goal status: INITIAL  2.  Patient will be able to walk without assistive device and no increased pain distances 1000 feet or more Baseline: 150 feet, increased pain and significant limp Goal status: INITIAL  3.  Patient will be able to extend knee to lacking no more than 5 degrees for improved heel strike with ambulation Baseline: -18 deg Goal status: INITIAL  4.  Patient be able to bend knee to 120 degrees for optimal transfer mechanics and mobility Baseline: 97 deg AAROM  Goal status: INITIAL  5.  Patient will be able to perform sit to stand transfer without use of hands and 13 seconds or less Baseline: 22 sec Goal status: INITIAL  6.  2 min walk test TBA  Baseline:  Goal status: INITIAL   PLAN:  PT FREQUENCY: 2x/week  PT DURATION: 6 weeks  PLANNED INTERVENTIONS: 97164- PT Re-evaluation, 97110-Therapeutic exercises,  47829- Therapeutic activity, [id number]- Neuromuscular re-education, [id number]- Self Care, [id number]- Manual therapy, [id number]- Gait training, 97014- Electrical stimulation (unattended), 97016- Vasopneumatic device, Patient/Family education, Balance training, Taping, and Joint mobilization  PLAN FOR NEXT SESSION: Check HEP, try NuStep, 2-minute walk test, vaso.  Include tape, manual therapy and ambulation/gait training   Tirza Senteno, PT 09/30/2023, 10:55 AM   Karie Mainland, PT 09/30/23 10:55 AM Phone: [phone] Fax: [phone]    [employer] Authorization   Choose one: Rehabilitative  Standardized Assessment or  Functional Outcome Tool: See Pain Assessment and Other FOTO   Score or Percent Disability: 42% able   Body Parts Treated (Select each separately):  Knee. Overall deficits/functional limitations for body part selected: moderate N/A. Overall deficits/functional limitations for body part selected: NA N/A. Overall deficits/functional limitations for body part selected: NA   If treatment provided at initial evaluation, no treatment charged due to lack of authorization.    Karie Mainland, PT 09/30/23 10:55 AM Phone: [phone] Fax: [phone]

## 2023-10-01 DIAGNOSIS — Z419 Encounter for procedure for purposes other than remedying health state, unspecified: Secondary | ICD-10-CM | POA: Diagnosis not present

## 2023-10-06 ENCOUNTER — Ambulatory Visit: Payer: Medicaid Other

## 2023-10-08 ENCOUNTER — Ambulatory Visit: Payer: Medicaid Other | Admitting: Physical Therapy

## 2023-10-11 NOTE — Therapy (Signed)
OUTPATIENT PHYSICAL THERAPY LOWER EXTREMITY TREATMENT  Patient Name: Adrienne Garcia MRN: [id number] DOB:August 17, 1966, 57 y.o., female Today's Date: 10/12/2023  END OF SESSION:  PT End of Session - 10/12/23 1100     Visit Number 3    Number of Visits 12    Date for PT Re-Evaluation 11/09/23    Authorization Type [place] [employer]    Authorization Time Period 10/30 to 12/30    Authorization - Visit Number 2    Authorization - Number of Visits 12    PT Start Time 1102    PT Stop Time 1150    PT Time Calculation (min) 48 min    Activity Tolerance Patient tolerated treatment well;Patient limited by pain    Behavior During Therapy WFL for tasks assessed/performed               Past Medical History:  Diagnosis Date   Anemia    Anxiety    Arthritis    knees   Asthma    Basal cell carcinoma of neck    "front of my neck; it was a melanoma"   Bipolar disorder (HCC)    COPD (chronic obstructive pulmonary disease) (HCC)    Daily headache    Depression    Family history of adverse reaction to anesthesia    MOM-HARD TO WAKE UP   Fibromyalgia    GERD (gastroesophageal reflux disease)    Heart murmur    H/O   History of hiatal hernia    SMALL   Intractable nausea and vomiting    Migraine    "probably twice/month" (09/19/2015)   Obesity    PTSD (post-traumatic stress disorder)    Thyroid disease    Past Surgical History:  Procedure Laterality Date   ABDOMINAL HYSTERECTOMY     CYSTOCELE REPAIR N/A 09/24/2020   Procedure: ANTERIOR REPAIR (CYSTOCELE);  Surgeon: Nadara Mustard, MD;  Location: [facility] OR[facility];  Service: Gynecology;  Laterality: N/A;   MOLE REMOVAL  ~ 2013   "front side of my neck"   MULTIPLE TOOTH EXTRACTIONS  ~ 2011   TOTAL KNEE ARTHROPLASTY Left 09/06/2023   Procedure: LEFT TOTAL KNEE ARTHROPLASTY;  Surgeon: Tarry Kos, MD;  Location: [facility];  Service: Orthopedics;  Laterality: Left;   VAGINAL HYSTERECTOMY Bilateral 09/24/2020   Procedure: HYSTERECTOMY VAGINAL  BILATERAL SALPINGO-OOPHERECTOMY;  Surgeon: Nadara Mustard, MD;  Location: [facility] OR[facility];  Service: Gynecology;  Laterality: Bilateral;   Patient Active Problem List   Diagnosis Date Noted   Status post total left knee replacement 09/06/2023   Primary osteoarthritis of left knee 09/05/2023   S/P vaginal hysterectomy 10/02/2020   Uterine prolapse 09/04/2020   Dyspareunia due to medical condition in female 09/04/2020   Urinary retention 09/04/2020   Influenza B 01/29/2016   Influenza 01/29/2016   Status asthmaticus    Bipolar affective disorder, currently manic, moderate (HCC)    Acute bronchitis with asthma with acute exacerbation 01/26/2016   Acute bronchitis with bronchospasm 01/26/2016   Bronchitis with bronchospasm    Costochondritis, acute    Intractable nausea and vomiting 09/19/2015   Hyperglycemia 09/19/2015   Leukocytosis 09/19/2015   Thrombocytopenia (HCC) 09/19/2015   Diarrhea 09/19/2015   Obesity 09/19/2015   Thyroid disease    Depression     PCP: Lynnea Ferrier MD   REFERRING PROVIDER: Gershon Mussel MD   REFERRING DIAG:  Diagnosis  [phone] (ICD-10-CM) - S/P total knee arthroplasty, left    THERAPY DIAG:  S/P left knee arthroscopy  Stiffness  of left knee, not elsewhere classified  Localized edema  Rationale for Evaluation and Treatment: Rehabilitation  ONSET DATE: 09/06/23  SUBJECTIVE:   SUBJECTIVE STATEMENT: My knee is hurting today, front of the knee down to the shin.    PERTINENT HISTORY: COPD, PTSD, anxiety, depression   PAIN:  Are you having pain? Yes: NPRS scale: 5/10 Pain location: Rt knee anterior  Pain description: aching, tight , sharp medially  Aggravating factors: walking, bending it  Relieving factors: meds, cold packs She has muscle spasms "comes and goes", 2-3 x per day.    PRECAUTIONS: None  RED FLAGS: None   WEIGHT BEARING RESTRICTIONS: No  FALLS:  Has patient fallen in last 6 months? No  LIVING ENVIRONMENT: Lives with:  lives with their partnerboyfriend Lives in: House/apartment trailer  Stairs: Yes: External: 6 steps; on right going up Has following equipment at home: Single point cane and Walker - 2 wheeled  OCCUPATION: not working  PLOF: Independent  PATIENT GOALS: I want to drive again, walking further   NEXT MD VISIT: 4 weeks   OBJECTIVE:   Note: Objective measures were completed at Evaluation unless otherwise noted.  DIAGNOSTIC FINDINGS: post op  PATIENT SURVEYS:  FOTO 42% goal is 56%  COGNITION: Overall cognitive status: Within functional limits for tasks assessed     SENSATION: WFL  EDEMA:  Circumferential: Rt 43 cm, Lt. 47 cm    PALPATION: Warm, not red along well incision on LLE   LOWER EXTREMITY ROM:  Active ROM Right eval Left eval Lt.  10/12/23  Hip flexion     Hip extension     Hip abduction     Hip adduction     Hip internal rotation     Hip external rotation     Knee flexion  90 AROM, able to get to 97 deg with AAROM  AROM  110 deg   Knee extension  -18 deg    Ankle dorsiflexion     Ankle plantarflexion     Ankle inversion     Ankle eversion      (Blank rows = not tested)  LOWER EXTREMITY MMT:  MMT Right eval Left eval  Hip flexion 4 3+  Hip extension    Hip abduction    Hip adduction    Hip internal rotation    Hip external rotation    Knee flexion 5 4- pain   Knee extension 5 4- pain       Ankle dorsiflexion 5 5  Ankle plantarflexion    Ankle inversion    Ankle eversion     (Blank rows = not tested)  LOWER EXTREMITY SPECIAL TESTS:  NT   FUNCTIONAL TESTS:  5 times sit to stand: 22 sec    GAIT: Distance walked: 150 Assistive device utilized: None Level of assistance: Modified independence Comments: antalgic gait, no device, knee flexion   TODAY'S TREATMENT:      [facility] Adult PT Treatment:                                                DATE: 10/12/23 Therapeutic Exercise: Nustep L5 UE and LE 5 min  2 min walk test 367 feet  LAQ  5 lbs x 15  Hamstring curl  AAROM Rt knee extension and flexion strap for AA  Quad set -limited  SAQ x 20  SLR  x 10  Bridge heels on bolster NT today  4 inch step ups x 15  Forward and lateral added hip abduction Rt/Lt LE x 10                                                                                                                            DATE: 09/28/23 [facility] Adult PT Treatment:                                                DATE: 09/30/23 Therapeutic Exercise: NuStep LE and UE for 8 min  Wall stretch for calf 30 sec x 3  Seated post knee stretch with strap, leg propped on step Seated knee flexion AROM, resisted green band anchored with PT  LAQ x 15, 5 sec  SAQ x 15 3 lbs  Quad set done on both sides  SLR x 10 L Hamstring set x 10  Modalities: Vaso 10 min coldest setting and mod pressure    Knee HEP given see below    PATIENT EDUCATION:  Education details: PT,POC, HEP, swelling mgmt, gait  Person educated: Patient Education method: Explanation, Demonstration, and Handouts Education comprehension: verbalized understanding, returned demonstration, and needs further education  HOME EXERCISE PROGRAM: Access Code: [email or web address] URL: [email or web address] Date: 09/28/2023 Prepared by: Karie Mainland  Exercises - Gastroc Stretch on Wall  - 1-3 x daily - 7 x weekly - 1 sets - 5 reps - 30 hold - Supine Hamstring Stretch with Strap  - 1-3 x daily - 7 x weekly - 1 sets - 5 reps - 30 hold - Supine Heel Slide with Strap  - 1-3 x daily - 7 x weekly - 1 sets - 5-10 reps - 30 hold - Seated Long Arc Quad  - 1-3 x daily - 7 x weekly - 2 sets - 10 reps - 5 hold - Supine Quad Set  - 1-3 x daily - 7 x weekly - 2 sets - 10 reps - 5 hold - Active Straight Leg Raise with Quad Set  - 1-3 x daily - 7 x weekly - 2 sets - 10 reps - 5 hold  ASSESSMENT:  CLINICAL IMPRESSION: Patient continues to have L knee pain and swelling consistent for stage of healing.  She continues to  make progress with ROM in flexion.  Pt with poor quad contraction in open chain and notable weakness in bilateral hip abduction. She will cont to benefit from skilled PT to improve her functional mobility.   OBJECTIVE IMPAIRMENTS: Abnormal gait, decreased activity tolerance, decreased knowledge of use of DME, decreased mobility, difficulty walking, decreased ROM, decreased strength, hypomobility, increased edema, increased fascial restrictions, impaired flexibility, obesity, and pain.   ACTIVITY LIMITATIONS: lifting, bending, standing, squatting, sleeping, stairs, transfers, and locomotion level  PARTICIPATION LIMITATIONS: meal prep, cleaning, interpersonal relationship, driving, shopping, and  community activity  PERSONAL FACTORS: 1-2 comorbidities: fibromyalgia, Rt knee pain   are also affecting patient's functional outcome.   REHAB POTENTIAL: Excellent  CLINICAL DECISION MAKING: Stable/uncomplicated  EVALUATION COMPLEXITY: Low   GOALS: Goals reviewed with patient? Yes   LONG TERM GOALS: Target date: 11/09/2023    Pt will be able to show independence with final HEP for knee range of motion and strength Baseline: Unknown to patient has a basic home program Goal status: ongoing   2.  Patient will be able to walk without assistive device and no increased pain distances 1000 feet or more Baseline: 150 feet, increased pain and significant limp Goal status: ongoing   3.  Patient will be able to extend knee to lacking no more than 5 degrees for improved heel strike with ambulation Baseline: -18 deg Goal status: ongoing   4.  Patient be able to bend knee to 120 degrees for optimal transfer mechanics and mobility Baseline: 97 deg AAROM  Goal status:ongoing   5.  Patient will be able to perform sit to stand transfer without use of hands and 13 seconds or less Baseline: 22 sec Goal status: INITIAL  6.  2 min walk test distance will improve to 425 feet  Baseline: 367 feet Goal  status: INITIAL   PLAN:  PT FREQUENCY: 2x/week  PT DURATION: 6 weeks  PLANNED INTERVENTIONS: 97164- PT Re-evaluation, 97110-Therapeutic exercises, 97530- Therapeutic activity, 97112- Neuromuscular re-education, 97535- Self Care, 16967- Manual therapy, 97116- Gait training, 97014- Electrical stimulation (unattended), 97016- Vasopneumatic device, Patient/Family education, Balance training, Taping, and Joint mobilization  PLAN FOR NEXT SESSION: Nustep, quad strength  , test hip abd mmt in S/L and add to HEP.   Include tape, manual therapy and ambulation/gait training   Meaghan Whistler, PT 10/12/2023, 11:50 AM   Karie Mainland, PT 10/12/23 11:50 AM Phone: [phone] Fax: [phone]

## 2023-10-12 ENCOUNTER — Other Ambulatory Visit: Payer: Self-pay

## 2023-10-12 ENCOUNTER — Encounter: Payer: Self-pay | Admitting: Physical Therapy

## 2023-10-12 ENCOUNTER — Ambulatory Visit: Payer: Medicaid Other | Attending: Orthopaedic Surgery | Admitting: Physical Therapy

## 2023-10-12 DIAGNOSIS — Z9889 Other specified postprocedural states: Secondary | ICD-10-CM

## 2023-10-12 DIAGNOSIS — R600 Localized edema: Secondary | ICD-10-CM

## 2023-10-12 DIAGNOSIS — M25662 Stiffness of left knee, not elsewhere classified: Secondary | ICD-10-CM

## 2023-10-14 ENCOUNTER — Other Ambulatory Visit: Payer: Self-pay

## 2023-10-15 ENCOUNTER — Ambulatory Visit: Payer: Medicaid Other | Admitting: Physical Therapy

## 2023-10-18 ENCOUNTER — Encounter: Payer: Medicaid Other | Admitting: Physical Therapy

## 2023-10-31 DIAGNOSIS — Z419 Encounter for procedure for purposes other than remedying health state, unspecified: Secondary | ICD-10-CM | POA: Diagnosis not present

## 2023-11-03 ENCOUNTER — Encounter: Payer: Self-pay | Admitting: Physical Therapy

## 2023-11-03 ENCOUNTER — Ambulatory Visit: Payer: Medicaid Other | Attending: Orthopaedic Surgery | Admitting: Physical Therapy

## 2023-11-03 DIAGNOSIS — M25662 Stiffness of left knee, not elsewhere classified: Secondary | ICD-10-CM

## 2023-11-03 DIAGNOSIS — Z9889 Other specified postprocedural states: Secondary | ICD-10-CM

## 2023-11-03 DIAGNOSIS — R600 Localized edema: Secondary | ICD-10-CM | POA: Diagnosis not present

## 2023-11-03 NOTE — Therapy (Signed)
OUTPATIENT PHYSICAL THERAPY LOWER EXTREMITY TREATMENT  Patient Name: Adrienne Garcia MRN: [id number] DOB:01-Jul-1966, 57 y.o., female Today's Date: 11/03/2023  END OF SESSION:  PT End of Session - 11/03/23 1145     Visit Number 4    Number of Visits 12    Date for PT Re-Evaluation 11/09/23    Authorization Type [place] [employer]    Authorization Time Period 10/30 to 12/30    Authorization - Visit Number 3    Authorization - Number of Visits 12    PT Start Time 1145    PT Stop Time 1226    PT Time Calculation (min) 41 min               Past Medical History:  Diagnosis Date   Anemia    Anxiety    Arthritis    knees   Asthma    Basal cell carcinoma of neck    "front of my neck; it was a melanoma"   Bipolar disorder (HCC)    COPD (chronic obstructive pulmonary disease) (HCC)    Daily headache    Depression    Family history of adverse reaction to anesthesia    MOM-HARD TO WAKE UP   Fibromyalgia    GERD (gastroesophageal reflux disease)    Heart murmur    H/O   History of hiatal hernia    SMALL   Intractable nausea and vomiting    Migraine    "probably twice/month" (09/19/2015)   Obesity    PTSD (post-traumatic stress disorder)    Thyroid disease    Past Surgical History:  Procedure Laterality Date   ABDOMINAL HYSTERECTOMY     CYSTOCELE REPAIR N/A 09/24/2020   Procedure: ANTERIOR REPAIR (CYSTOCELE);  Surgeon: Nadara Mustard, MD;  Location: [facility];  Service: Gynecology;  Laterality: N/A;   MOLE REMOVAL  ~ 2013   "front side of my neck"   MULTIPLE TOOTH EXTRACTIONS  ~ 2011   TOTAL KNEE ARTHROPLASTY Left 09/06/2023   Procedure: LEFT TOTAL KNEE ARTHROPLASTY;  Surgeon: Tarry Kos, MD;  Location: [facility];  Service: Orthopedics;  Laterality: Left;   VAGINAL HYSTERECTOMY Bilateral 09/24/2020   Procedure: HYSTERECTOMY VAGINAL BILATERAL SALPINGO-OOPHERECTOMY;  Surgeon: Nadara Mustard, MD;  Location: [facility];  Service: Gynecology;  Laterality: Bilateral;    Patient Active Problem List   Diagnosis Date Noted   Status post total left knee replacement 09/06/2023   Primary osteoarthritis of left knee 09/05/2023   S/P vaginal hysterectomy 10/02/2020   Uterine prolapse 09/04/2020   Dyspareunia due to medical condition in female 09/04/2020   Urinary retention 09/04/2020   Influenza B 01/29/2016   Influenza 01/29/2016   Status asthmaticus    Bipolar affective disorder, currently manic, moderate (HCC)    Acute bronchitis with asthma with acute exacerbation 01/26/2016   Acute bronchitis with bronchospasm 01/26/2016   Bronchitis with bronchospasm    Costochondritis, acute    Intractable nausea and vomiting 09/19/2015   Hyperglycemia 09/19/2015   Leukocytosis 09/19/2015   Thrombocytopenia (HCC) 09/19/2015   Diarrhea 09/19/2015   Obesity 09/19/2015   Thyroid disease    Depression     PCP: Lynnea Ferrier MD   REFERRING PROVIDER: Gershon Mussel MD   REFERRING DIAG:  Diagnosis  [phone] (ICD-10-CM) - S/P total knee arthroplasty, left    THERAPY DIAG:  S/P left knee arthroscopy  Stiffness of left knee, not elsewhere classified  Localized edema  Rationale for Evaluation and Treatment: Rehabilitation  ONSET DATE: 09/06/23  SUBJECTIVE:  SUBJECTIVE STATEMENT: My knee hurts when I stand more. I take breaks.   PERTINENT HISTORY: COPD, PTSD, anxiety, depression   PAIN:  Are you having pain? Yes: NPRS scale: 5/10 Pain location: Rt knee anterior  Pain description: aching, tight , sharp medially  Aggravating factors: walking, bending it  Relieving factors: meds, cold packs She has muscle spasms "comes and goes", 2-3 x per day.    PRECAUTIONS: None  RED FLAGS: None   WEIGHT BEARING RESTRICTIONS: No  FALLS:  Has patient fallen in last 6 months? No  LIVING ENVIRONMENT: Lives with: lives with their partnerboyfriend Lives in: House/apartment trailer  Stairs: Yes: External: 6 steps; on right going up Has following equipment  at home: Single point cane and Walker - 2 wheeled  OCCUPATION: not working  PLOF: Independent  PATIENT GOALS: I want to drive again, walking further   NEXT MD VISIT: 4 weeks   OBJECTIVE:   Note: Objective measures were completed at Evaluation unless otherwise noted.  DIAGNOSTIC FINDINGS: post op  PATIENT SURVEYS:  FOTO 42% goal is 56%  COGNITION: Overall cognitive status: Within functional limits for tasks assessed     SENSATION: WFL  EDEMA:  Circumferential: Rt 43 cm, Lt. 47 cm    PALPATION: Warm, not red along well incision on LLE   LOWER EXTREMITY ROM:  Active ROM Right eval Left eval Lt.  10/12/23 LT 11/03/23  Hip flexion      Hip extension      Hip abduction      Hip adduction      Hip internal rotation      Hip external rotation      Knee flexion  90 AROM, able to get to 97 deg with AAROM  AROM  110 deg  113 A/ 118 P  Knee extension  -18 deg   -10  Ankle dorsiflexion      Ankle plantarflexion      Ankle inversion      Ankle eversion       (Blank rows = not tested)  LOWER EXTREMITY MMT:  MMT Right eval Left eval  Hip flexion 4 3+  Hip extension    Hip abduction    Hip adduction    Hip internal rotation    Hip external rotation    Knee flexion 5 4- pain   Knee extension 5 4- pain       Ankle dorsiflexion 5 5  Ankle plantarflexion    Ankle inversion    Ankle eversion     (Blank rows = not tested)  LOWER EXTREMITY SPECIAL TESTS:  NT   FUNCTIONAL TESTS:  5 times sit to stand: 22 sec    GAIT: Distance walked: 150 Assistive device utilized: None Level of assistance: Modified independence Comments: antalgic gait, no device, knee flexion   TODAY'S TREATMENT:      [facility] Adult PT Treatment:                                                DATE: 11/03/23 Therapeutic Exercise: QS into towel 5 sec x 12  SLR x12 Hamstring stretch with strap  AAROM knee flexion with strap  STS x 10 Nustep L4 LE only x 5 minutes  Left 6 inch step up x  10 Left 6 inch lateral step down LAQ 5# 10 x 2  H/s curl BTB  10 x 2  SLS 2-3 sec best Tandem stance 10 sec, 20 sec best Manual Therapy: Patella mobs     [facility] Adult PT Treatment:                                                DATE: 10/12/23 Therapeutic Exercise: Nustep L5 UE and LE 5 min  2 min walk test 367 feet  LAQ 5 lbs x 15  Hamstring curl  AAROM Rt knee extension and flexion strap for AA  Quad set -limited  SAQ x 20  SLR  x 10  Bridge heels on bolster NT today  4 inch step ups x 15  Forward and lateral added hip abduction Rt/Lt LE x 10                                                                                                                            DATE: 09/28/23 [facility] Adult PT Treatment:                                                DATE: 09/30/23 Therapeutic Exercise: NuStep LE and UE for 8 min  Wall stretch for calf 30 sec x 3  Seated post knee stretch with strap, leg propped on step Seated knee flexion AROM, resisted green band anchored with PT  LAQ x 15, 5 sec  SAQ x 15 3 lbs  Quad set done on both sides  SLR x 10 L Hamstring set x 10  Modalities: Vaso 10 min coldest setting and mod pressure    Knee HEP given see below    PATIENT EDUCATION:  Education details: PT,POC, HEP, swelling mgmt, gait  Person educated: Patient Education method: Explanation, Demonstration, and Handouts Education comprehension: verbalized understanding, returned demonstration, and needs further education  HOME EXERCISE PROGRAM: Access Code: [email or web address] URL: [email or web address] Date: 09/28/2023 Prepared by: Karie Mainland  Exercises - Gastroc Stretch on Wall  - 1-3 x daily - 7 x weekly - 1 sets - 5 reps - 30 hold - Supine Hamstring Stretch with Strap  - 1-3 x daily - 7 x weekly - 1 sets - 5 reps - 30 hold - Supine Heel Slide with Strap  - 1-3 x daily - 7 x weekly - 1 sets - 5-10 reps - 30 hold - Seated Long Arc Quad  - 1-3 x daily - 7 x weekly - 2 sets -  10 reps - 5 hold - Supine Quad Set  - 1-3 x daily - 7 x weekly - 2 sets - 10 reps - 5 hold - Active Straight Leg Raise with Quad Set  - 1-3 x daily - 7 x weekly - 2 sets -  10 reps - 5 hold 11/03/23 - Step Up  - 1 x daily - 7 x weekly - 2 sets - 10 reps - Tandem Stance with Support  - 1 x daily - 7 x weekly - 1 sets - 2-3 reps - 30 hold  ASSESSMENT:  CLINICAL IMPRESSION: Pt arrives after 2 week absence from PT. Her knee flexion has reached 118 passively and she lacks about 10 degrees of extension. She does continue to have pain in anterior knee and upper medial shin. Progressed to closed chain strengthening with good tolerance. Her SLS is limited to 3 sec on LLE so added Tandem stance which she was able to hold for 20 sec without LOB. Updated HEP to include step ups and tandem stance. No complaints at end of session. Will plan to add hip abduction strengthening next visit.   OBJECTIVE IMPAIRMENTS: Abnormal gait, decreased activity tolerance, decreased knowledge of use of DME, decreased mobility, difficulty walking, decreased ROM, decreased strength, hypomobility, increased edema, increased fascial restrictions, impaired flexibility, obesity, and pain.   ACTIVITY LIMITATIONS: lifting, bending, standing, squatting, sleeping, stairs, transfers, and locomotion level  PARTICIPATION LIMITATIONS: meal prep, cleaning, interpersonal relationship, driving, shopping, and community activity  PERSONAL FACTORS: 1-2 comorbidities: fibromyalgia, Rt knee pain   are also affecting patient's functional outcome.   REHAB POTENTIAL: Excellent  CLINICAL DECISION MAKING: Stable/uncomplicated  EVALUATION COMPLEXITY: Low   GOALS: Goals reviewed with patient? Yes   LONG TERM GOALS: Target date: 11/09/2023    Pt will be able to show independence with final HEP for knee range of motion and strength Baseline: Unknown to patient has a basic home program Goal status: ongoing   2.  Patient will be able to walk  without assistive device and no increased pain distances 1000 feet or more Baseline: 150 feet, increased pain and significant limp Goal status: ongoing   3.  Patient will be able to extend knee to lacking no more than 5 degrees for improved heel strike with ambulation Baseline: -18 deg Goal status: ongoing   4.  Patient be able to bend knee to 120 degrees for optimal transfer mechanics and mobility Baseline: 97 deg AAROM  Goal status:ongoing   5.  Patient will be able to perform sit to stand transfer without use of hands and 13 seconds or less Baseline: 22 sec Goal status: INITIAL  6.  2 min walk test distance will improve to 425 feet  Baseline: 367 feet Goal status: INITIAL   PLAN:  PT FREQUENCY: 2x/week  PT DURATION: 6 weeks  PLANNED INTERVENTIONS: 97164- PT Re-evaluation, 97110-Therapeutic exercises, 97530- Therapeutic activity, 97112- Neuromuscular re-education, 97535- Self Care, 78295- Manual therapy, 97116- Gait training, 97014- Electrical stimulation (unattended), 97016- Vasopneumatic device, Patient/Family education, Balance training, Taping, and Joint mobilization  PLAN FOR NEXT SESSION: Nustep, quad strength  , test hip abd mmt in S/L and add to HEP.   Include tape, manual therapy and ambulation/gait training   Jannette Spanner, PTA 11/03/23 12:37 PM Phone: [phone] Fax: [phone]

## 2023-11-05 ENCOUNTER — Ambulatory Visit: Payer: Medicaid Other | Admitting: Physical Therapy

## 2023-11-09 ENCOUNTER — Ambulatory Visit: Payer: Medicaid Other | Admitting: Physical Therapy

## 2023-11-11 NOTE — Therapy (Unsigned)
OUTPATIENT PHYSICAL THERAPY LOWER EXTREMITY TREATMENT  Patient Name: Adrienne Garcia MRN: [id number] DOB:1966/10/17, 57 y.o., female Today's Date: 11/11/2023  END OF SESSION:      Past Medical History:  Diagnosis Date   Anemia    Anxiety    Arthritis    knees   Asthma    Basal cell carcinoma of neck    "front of my neck; it was a melanoma"   Bipolar disorder (HCC)    COPD (chronic obstructive pulmonary disease) (HCC)    Daily headache    Depression    Family history of adverse reaction to anesthesia    MOM-HARD TO WAKE UP   Fibromyalgia    GERD (gastroesophageal reflux disease)    Heart murmur    H/O   History of hiatal hernia    SMALL   Intractable nausea and vomiting    Migraine    "probably twice/month" (09/19/2015)   Obesity    PTSD (post-traumatic stress disorder)    Thyroid disease    Past Surgical History:  Procedure Laterality Date   ABDOMINAL HYSTERECTOMY     CYSTOCELE REPAIR N/A 09/24/2020   Procedure: ANTERIOR REPAIR (CYSTOCELE);  Surgeon: Nadara Mustard, MD;  Location: [facility];  Service: Gynecology;  Laterality: N/A;   MOLE REMOVAL  ~ 2013   "front side of my neck"   MULTIPLE TOOTH EXTRACTIONS  ~ 2011   TOTAL KNEE ARTHROPLASTY Left 09/06/2023   Procedure: LEFT TOTAL KNEE ARTHROPLASTY;  Surgeon: Tarry Kos, MD;  Location: [facility];  Service: Orthopedics;  Laterality: Left;   VAGINAL HYSTERECTOMY Bilateral 09/24/2020   Procedure: HYSTERECTOMY VAGINAL BILATERAL SALPINGO-OOPHERECTOMY;  Surgeon: Nadara Mustard, MD;  Location: [facility];  Service: Gynecology;  Laterality: Bilateral;   Patient Active Problem List   Diagnosis Date Noted   Status post total left knee replacement 09/06/2023   Primary osteoarthritis of left knee 09/05/2023   S/P vaginal hysterectomy 10/02/2020   Uterine prolapse 09/04/2020   Dyspareunia due to medical condition in female 09/04/2020   Urinary retention 09/04/2020   Influenza B 01/29/2016   Influenza 01/29/2016   Status  asthmaticus    Bipolar affective disorder, currently manic, moderate (HCC)    Acute bronchitis with asthma with acute exacerbation 01/26/2016   Acute bronchitis with bronchospasm 01/26/2016   Bronchitis with bronchospasm    Costochondritis, acute    Intractable nausea and vomiting 09/19/2015   Hyperglycemia 09/19/2015   Leukocytosis 09/19/2015   Thrombocytopenia (HCC) 09/19/2015   Diarrhea 09/19/2015   Obesity 09/19/2015   Thyroid disease    Depression     PCP: Lynnea Ferrier MD   REFERRING PROVIDER: Gershon Mussel MD   REFERRING DIAG:  Diagnosis  [phone] (ICD-10-CM) - S/P total knee arthroplasty, left    THERAPY DIAG:  No diagnosis found.  Rationale for Evaluation and Treatment: Rehabilitation  ONSET DATE: 09/06/23  SUBJECTIVE:   SUBJECTIVE STATEMENT: My knee hurts when I stand more. I take breaks.   PERTINENT HISTORY: COPD, PTSD, anxiety, depression   PAIN:  Are you having pain? Yes: NPRS scale: 5/10 Pain location: Rt knee anterior  Pain description: aching, tight , sharp medially  Aggravating factors: walking, bending it  Relieving factors: meds, cold packs She has muscle spasms "comes and goes", 2-3 x per day.    PRECAUTIONS: None  RED FLAGS: None   WEIGHT BEARING RESTRICTIONS: No  FALLS:  Has patient fallen in last 6 months? No  LIVING ENVIRONMENT: Lives with: lives with their partnerboyfriend Lives in: House/apartment  trailer  Stairs: Yes: External: 6 steps; on right going up Has following equipment at home: Single point cane and Walker - 2 wheeled  OCCUPATION: not working  PLOF: Independent  PATIENT GOALS: I want to drive again, walking further   NEXT MD VISIT: 4 weeks   OBJECTIVE:   Note: Objective measures were completed at Evaluation unless otherwise noted.  DIAGNOSTIC FINDINGS: post op  PATIENT SURVEYS:  FOTO 42% goal is 56%  COGNITION: Overall cognitive status: Within functional limits for tasks  assessed     SENSATION: WFL  EDEMA:  Circumferential: Rt 43 cm, Lt. 47 cm    PALPATION: Warm, not red along well incision on LLE   LOWER EXTREMITY ROM:  Active ROM Right eval Left eval Lt.  10/12/23 LT 11/03/23  Hip flexion      Hip extension      Hip abduction      Hip adduction      Hip internal rotation      Hip external rotation      Knee flexion  90 AROM, able to get to 97 deg with AAROM  AROM  110 deg  113 A/ 118 P  Knee extension  -18 deg   -10  Ankle dorsiflexion      Ankle plantarflexion      Ankle inversion      Ankle eversion       (Blank rows = not tested)  LOWER EXTREMITY MMT:  MMT Right eval Left eval  Hip flexion 4 3+  Hip extension    Hip abduction    Hip adduction    Hip internal rotation    Hip external rotation    Knee flexion 5 4- pain   Knee extension 5 4- pain       Ankle dorsiflexion 5 5  Ankle plantarflexion    Ankle inversion    Ankle eversion     (Blank rows = not tested)  LOWER EXTREMITY SPECIAL TESTS:  NT   FUNCTIONAL TESTS:  5 times sit to stand: 22 sec    GAIT: Distance walked: 150 Assistive device utilized: None Level of assistance: Modified independence Comments: antalgic gait, no device, knee flexion   TODAY'S TREATMENT:       [facility] Adult PT Treatment:                                                DATE: 11/12/23 Therapeutic Exercise: *** Manual Therapy: *** Neuromuscular re-ed: *** Therapeutic Activity: *** Modalities: *** Self Care: ***  [facility] PT Treatment:                                                DATE: 11/03/23 Therapeutic Exercise: QS into towel 5 sec x 12  SLR x12 Hamstring stretch with strap  AAROM knee flexion with strap  STS x 10 Nustep L4 LE only x 5 minutes  Left 6 inch step up x 10 Left 6 inch lateral step down LAQ 5# 10 x 2  H/s curl BTB 10 x 2  SLS 2-3 sec best Tandem stance 10 sec, 20 sec best Manual Therapy: Patella mobs     [facility] Adult PT Treatment:  DATE: 10/12/23 Therapeutic Exercise: Nustep L5 UE and LE 5 min  2 min walk test 367 feet  LAQ 5 lbs x 15  Hamstring curl  AAROM Rt knee extension and flexion strap for AA  Quad set -limited  SAQ x 20  SLR  x 10  Bridge heels on bolster NT today  4 inch step ups x 15  Forward and lateral added hip abduction Rt/Lt LE x 10                                                                                                                            DATE: 09/28/23 [facility] Adult PT Treatment:                                                DATE: 09/30/23 Therapeutic Exercise: NuStep LE and UE for 8 min  Wall stretch for calf 30 sec x 3  Seated post knee stretch with strap, leg propped on step Seated knee flexion AROM, resisted green band anchored with PT  LAQ x 15, 5 sec  SAQ x 15 3 lbs  Quad set done on both sides  SLR x 10 L Hamstring set x 10  Modalities: Vaso 10 min coldest setting and mod pressure    Knee HEP given see below    PATIENT EDUCATION:  Education details: PT,POC, HEP, swelling mgmt, gait  Person educated: Patient Education method: Explanation, Demonstration, and Handouts Education comprehension: verbalized understanding, returned demonstration, and needs further education  HOME EXERCISE PROGRAM: Access Code: [email or web address] URL: [email or web address] Date: 09/28/2023 Prepared by: Karie Mainland  Exercises - Gastroc Stretch on Wall  - 1-3 x daily - 7 x weekly - 1 sets - 5 reps - 30 hold - Supine Hamstring Stretch with Strap  - 1-3 x daily - 7 x weekly - 1 sets - 5 reps - 30 hold - Supine Heel Slide with Strap  - 1-3 x daily - 7 x weekly - 1 sets - 5-10 reps - 30 hold - Seated Long Arc Quad  - 1-3 x daily - 7 x weekly - 2 sets - 10 reps - 5 hold - Supine Quad Set  - 1-3 x daily - 7 x weekly - 2 sets - 10 reps - 5 hold - Active Straight Leg Raise with Quad Set  - 1-3 x daily - 7 x weekly - 2 sets - 10 reps - 5 hold 11/03/23 -  Step Up  - 1 x daily - 7 x weekly - 2 sets - 10 reps - Tandem Stance with Support  - 1 x daily - 7 x weekly - 1 sets - 2-3 reps - 30 hold  ASSESSMENT:  CLINICAL IMPRESSION: Pt arrives after 2 week absence from PT. Her knee flexion has reached 118 passively and she lacks about 10 degrees of  extension. She does continue to have pain in anterior knee and upper medial shin. Progressed to closed chain strengthening with good tolerance. Her SLS is limited to 3 sec on LLE so added Tandem stance which she was able to hold for 20 sec without LOB. Updated HEP to include step ups and tandem stance. No complaints at end of session. Will plan to add hip abduction strengthening next visit.   OBJECTIVE IMPAIRMENTS: Abnormal gait, decreased activity tolerance, decreased knowledge of use of DME, decreased mobility, difficulty walking, decreased ROM, decreased strength, hypomobility, increased edema, increased fascial restrictions, impaired flexibility, obesity, and pain.   ACTIVITY LIMITATIONS: lifting, bending, standing, squatting, sleeping, stairs, transfers, and locomotion level  PARTICIPATION LIMITATIONS: meal prep, cleaning, interpersonal relationship, driving, shopping, and community activity  PERSONAL FACTORS: 1-2 comorbidities: fibromyalgia, Rt knee pain   are also affecting patient's functional outcome.   REHAB POTENTIAL: Excellent  CLINICAL DECISION MAKING: Stable/uncomplicated  EVALUATION COMPLEXITY: Low   GOALS: Goals reviewed with patient? Yes   LONG TERM GOALS: Target date: 11/09/2023    Pt will be able to show independence with final HEP for knee range of motion and strength Baseline: Unknown to patient has a basic home program Goal status: ongoing   2.  Patient will be able to walk without assistive device and no increased pain distances 1000 feet or more Baseline: 150 feet, increased pain and significant limp Goal status: ongoing   3.  Patient will be able to extend knee to  lacking no more than 5 degrees for improved heel strike with ambulation Baseline: -18 deg Goal status: ongoing   4.  Patient be able to bend knee to 120 degrees for optimal transfer mechanics and mobility Baseline: 97 deg AAROM  Goal status:ongoing   5.  Patient will be able to perform sit to stand transfer without use of hands and 13 seconds or less Baseline: 22 sec Goal status: INITIAL  6.  2 min walk test distance will improve to 425 feet  Baseline: 367 feet Goal status: INITIAL   PLAN:  PT FREQUENCY: 2x/week  PT DURATION: 6 weeks  PLANNED INTERVENTIONS: 97164- PT Re-evaluation, 97110-Therapeutic exercises, 97530- Therapeutic activity, 97112- Neuromuscular re-education, 97535- Self Care, 56213- Manual therapy, 97116- Gait training, 97014- Electrical stimulation (unattended), 97016- Vasopneumatic device, Patient/Family education, Balance training, Taping, and Joint mobilization  PLAN FOR NEXT SESSION: Nustep, quad strength  , test hip abd mmt in S/L and add to HEP.   Include tape, manual therapy and ambulation/gait training   Jannette Spanner, PTA 11/11/23 12:55 PM Phone: [phone] Fax: [phone]

## 2023-11-12 ENCOUNTER — Telehealth: Payer: Self-pay | Admitting: Physical Therapy

## 2023-11-12 ENCOUNTER — Ambulatory Visit: Payer: Medicaid Other | Admitting: Physical Therapy

## 2023-11-12 NOTE — Telephone Encounter (Signed)
Called patient regarding missed appt today.  Left voicemail and message to call back if she needed more appt. Pt will need to make 1 appt at time if she would like to continue PT.   Karie Mainland, PT 11/12/23 12:16 PM Phone: [phone] Fax: [phone]

## 2023-12-01 DIAGNOSIS — Z419 Encounter for procedure for purposes other than remedying health state, unspecified: Secondary | ICD-10-CM | POA: Diagnosis not present

## 2024-01-01 DIAGNOSIS — Z419 Encounter for procedure for purposes other than remedying health state, unspecified: Secondary | ICD-10-CM | POA: Diagnosis not present

## 2024-01-13 ENCOUNTER — Emergency Department (HOSPITAL_COMMUNITY)
Admission: EM | Admit: 2024-01-13 | Discharge: 2024-01-13 | Disposition: A | Discharge status: Home / Self Care | Payer: Medicaid Other | Attending: Emergency Medicine | Admitting: Emergency Medicine

## 2024-01-13 ENCOUNTER — Emergency Department (HOSPITAL_COMMUNITY): Payer: Medicaid Other

## 2024-01-13 ENCOUNTER — Emergency Department (HOSPITAL_COMMUNITY)
Admission: EM | Admit: 2024-01-13 | Discharge: 2024-01-13 | Discharge status: Against Medical Advice | Payer: Medicaid Other

## 2024-01-13 ENCOUNTER — Encounter (HOSPITAL_COMMUNITY): Payer: Self-pay | Admitting: Emergency Medicine

## 2024-01-13 DIAGNOSIS — J45909 Unspecified asthma, uncomplicated: Secondary | ICD-10-CM | POA: Diagnosis not present

## 2024-01-13 DIAGNOSIS — R509 Fever, unspecified: Secondary | ICD-10-CM | POA: Diagnosis not present

## 2024-01-13 DIAGNOSIS — R051 Acute cough: Secondary | ICD-10-CM | POA: Diagnosis not present

## 2024-01-13 DIAGNOSIS — Z20828 Contact with and (suspected) exposure to other viral communicable diseases: Secondary | ICD-10-CM | POA: Diagnosis not present

## 2024-01-13 DIAGNOSIS — R112 Nausea with vomiting, unspecified: Secondary | ICD-10-CM | POA: Diagnosis not present

## 2024-01-13 DIAGNOSIS — R739 Hyperglycemia, unspecified: Secondary | ICD-10-CM | POA: Diagnosis not present

## 2024-01-13 DIAGNOSIS — E876 Hypokalemia: Secondary | ICD-10-CM | POA: Diagnosis not present

## 2024-01-13 DIAGNOSIS — B349 Viral infection, unspecified: Secondary | ICD-10-CM | POA: Insufficient documentation

## 2024-01-13 DIAGNOSIS — R0602 Shortness of breath: Secondary | ICD-10-CM | POA: Diagnosis not present

## 2024-01-13 DIAGNOSIS — E1165 Type 2 diabetes mellitus with hyperglycemia: Secondary | ICD-10-CM | POA: Diagnosis not present

## 2024-01-13 DIAGNOSIS — R0989 Other specified symptoms and signs involving the circulatory and respiratory systems: Secondary | ICD-10-CM | POA: Diagnosis not present

## 2024-01-13 DIAGNOSIS — Z743 Need for continuous supervision: Secondary | ICD-10-CM | POA: Diagnosis not present

## 2024-01-13 DIAGNOSIS — R059 Cough, unspecified: Secondary | ICD-10-CM | POA: Diagnosis not present

## 2024-01-13 DIAGNOSIS — Z20822 Contact with and (suspected) exposure to covid-19: Secondary | ICD-10-CM | POA: Diagnosis not present

## 2024-01-13 DIAGNOSIS — R197 Diarrhea, unspecified: Secondary | ICD-10-CM | POA: Diagnosis not present

## 2024-01-13 LAB — RESP PANEL BY RT-PCR (RSV, FLU A&B, COVID)  RVPGX2
Influenza A by PCR: NEGATIVE
Influenza B by PCR: NEGATIVE
Resp Syncytial Virus by PCR: NEGATIVE
SARS Coronavirus 2 by RT PCR: NEGATIVE

## 2024-01-13 LAB — COMPREHENSIVE METABOLIC PANEL
ALT: 19 U/L (ref 0–44)
AST: 18 U/L (ref 15–41)
Albumin: 4.3 g/dL (ref 3.5–5.0)
Alkaline Phosphatase: 114 U/L (ref 38–126)
Anion gap: 17 — ABNORMAL HIGH (ref 5–15)
BUN: 18 mg/dL (ref 6–20)
CO2: 23 mmol/L (ref 22–32)
Calcium: 10.3 mg/dL (ref 8.9–10.3)
Chloride: 96 mmol/L — ABNORMAL LOW (ref 98–111)
Creatinine, Ser: 0.73 mg/dL (ref 0.44–1.00)
GFR, Estimated: >60 mL/min (ref >60)
Glucose, Bld: 156 mg/dL — ABNORMAL HIGH (ref 70–99)
Potassium: 3.3 mmol/L — ABNORMAL LOW (ref 3.5–5.1)
Sodium: 136 mmol/L (ref 135–145)
Total Bilirubin: 0.7 mg/dL (ref 0.0–1.2)
Total Protein: 7.9 g/dL (ref 6.5–8.1)

## 2024-01-13 LAB — URINALYSIS, ROUTINE W REFLEX MICROSCOPIC
Glucose, UA: NEGATIVE mg/dL
Ketones, ur: NEGATIVE mg/dL
Leukocytes,Ua: NEGATIVE
Nitrite: NEGATIVE
Protein, ur: >=300 mg/dL — AB
Specific Gravity, Urine: 1.042 — ABNORMAL HIGH (ref 1.005–1.030)
pH: 5 (ref 5.0–8.0)

## 2024-01-13 LAB — CBC
HCT: 47.4 % — ABNORMAL HIGH (ref 36.0–46.0)
Hemoglobin: 16 g/dL — ABNORMAL HIGH (ref 12.0–15.0)
MCH: 30.7 pg (ref 26.0–34.0)
MCHC: 33.8 g/dL (ref 30.0–36.0)
MCV: 90.8 fL (ref 80.0–100.0)
Platelets: 266 10*3/uL (ref 150–400)
RBC: 5.22 MIL/uL — ABNORMAL HIGH (ref 3.87–5.11)
RDW: 13.4 % (ref 11.5–15.5)
WBC: 17.7 10*3/uL — ABNORMAL HIGH (ref 4.0–10.5)
nRBC: 0 % (ref 0.0–0.2)

## 2024-01-13 LAB — TROPONIN I (HIGH SENSITIVITY): Troponin I (High Sensitivity): 13 ng/L (ref <18)

## 2024-01-13 LAB — LIPASE, BLOOD: Lipase: 19 U/L (ref 11–51)

## 2024-01-13 MED ORDER — ALBUTEROL SULFATE (2.5 MG/3ML) 0.083% IN NEBU
5.0000 mg | INHALATION_SOLUTION | Freq: Once | RESPIRATORY_TRACT | Status: AC
  Administered 2024-01-13: 5 mg via RESPIRATORY_TRACT
  Filled 2024-01-13: qty 6

## 2024-01-13 MED ORDER — KETOROLAC TROMETHAMINE 15 MG/ML IJ SOLN
15.0000 mg | Freq: Once | INTRAMUSCULAR | Status: AC
Start: 2024-01-13 — End: 2024-01-13
  Administered 2024-01-13: 15 mg via INTRAVENOUS
  Filled 2024-01-13: qty 1

## 2024-01-13 MED ORDER — LACTATED RINGERS IV BOLUS
1000.0000 mL | Freq: Once | INTRAVENOUS | Status: AC
  Administered 2024-01-13: 1000 mL via INTRAVENOUS

## 2024-01-13 MED ORDER — POTASSIUM CHLORIDE CRYS ER 20 MEQ PO TBCR
40.0000 meq | EXTENDED_RELEASE_TABLET | Freq: Once | ORAL | Status: AC
  Administered 2024-01-13: 40 meq via ORAL
  Filled 2024-01-13: qty 2

## 2024-01-13 MED ORDER — ONDANSETRON 4 MG PO TBDP: ORAL_TABLET | ORAL | 0 refills | Status: DC

## 2024-01-13 MED ORDER — ONDANSETRON 4 MG PO TBDP
4.0000 mg | ORAL_TABLET | Freq: Once | ORAL | Status: AC | PRN
  Administered 2024-01-13: 4 mg via ORAL
  Filled 2024-01-13: qty 1

## 2024-01-13 MED ORDER — ONDANSETRON HCL 4 MG/2ML IJ SOLN
4.0000 mg | Freq: Once | INTRAMUSCULAR | Status: AC
  Administered 2024-01-13: 4 mg via INTRAVENOUS
  Filled 2024-01-13: qty 2

## 2024-01-13 NOTE — ED Triage Notes (Addendum)
Patient present nausea, vomiting and diarrhea for 10 days. She went [facility] today, but left due to the wait time. She also complains of a productive cough, chest pain, shortness of breath and generalized body aches.    EMS vitals: 98.6 Temp 213 CBG 160/92 BP 60 HR 96% RA

## 2024-01-13 NOTE — ED Notes (Signed)
Called x 1 for triage unable to locate in lobby.

## 2024-01-13 NOTE — ED Provider Notes (Signed)
 [place] [facility] AT [facility] Provider Note   CSN: [id number] Arrival date & time: 01/13/24  1149     History  Chief Complaint  Patient presents with   Nausea   Emesis   Diarrhea   Cough   Chest Pain    Adrienne Garcia is a 58 y.o. female.  Patient is a 58 year old female with a history of thyroid disease, prior myalgia, PTSD, asthma, bipolar disorder, migraines who presents with cough and vomiting.  She said she started having symptoms about a week ago with runny nose congestion and coughing.  She had some fevers for the first couple days.  Then she had onset of nausea and vomiting.  She has had some associated diarrhea.  Her emesis is nonbloody and nonbilious.  Her diarrhea is watery, nonbloody.  She has had some associated shortness of breath and has been using her inhaler more often.  She has some pain in the center of her chest which she thinks is more due to the vomiting.       Home Medications Prior to Admission medications   Medication Sig Start Date End Date Taking? Authorizing Provider  ondansetron (ZOFRAN-ODT) 4 MG disintegrating tablet 4mg  ODT q4 hours prn nausea/vomit 01/13/24  Yes Rolan Bucco, MD  aspirin EC 81 MG tablet Take 1 tablet (81 mg total) by mouth 2 (two) times daily. To be taken after surgery to prevent blood clots 08/31/23 08/30/24  Cristie Hem, PA-C  docusate sodium (COLACE) 100 MG capsule Take 1 capsule (100 mg total) by mouth daily as needed. 08/31/23 08/30/24  Cristie Hem, PA-C  HYDROcodone-acetaminophen (NORCO) 5-325 MG tablet Take 1-2 tablets by mouth 3 (three) times daily as needed. 09/21/23   Cristie Hem, PA-C  methocarbamol (ROBAXIN-750) 750 MG tablet Take 1 tablet (750 mg total) by mouth 2 (two) times daily as needed for muscle spasms. 08/31/23   Cristie Hem, PA-C  methocarbamol (ROBAXIN-750) 750 MG tablet Take 1 tablet (750 mg total) by mouth 2 (two) times daily as needed for muscle spasms. Patient not  taking: Reported on 09/28/2023 09/21/23   Cristie Hem, PA-C  ondansetron (ZOFRAN) 4 MG tablet Take 1 tablet (4 mg total) by mouth every 6 (six) hours. 09/10/23   Glendale Chard, DO  oxyCODONE-acetaminophen (PERCOCET) 5-325 MG tablet Take 1-2 tablets by mouth every 6 (six) hours as needed. To be taken after surgery Patient not taking: Reported on 09/28/2023 08/31/23   Cristie Hem, PA-C  pantoprazole (PROTONIX) 40 MG tablet Take 1 tablet (40 mg total) by mouth as needed. 06/29/23   Donita Brooks, MD  promethazine (PHENERGAN) 25 MG tablet Take 1 tablet (25 mg total) by mouth every 6 (six) hours as needed for nausea or vomiting. 06/29/23   Donita Brooks, MD  potassium chloride (K-DUR) 10 MEQ tablet Take 1 tablet (10 mEq total) by mouth daily. 07/07/17 03/27/20  Audry Pili, PA-C      Allergies    Ciprofloxacin    Review of Systems   Review of Systems  Constitutional:  Positive for fatigue. Negative for chills, diaphoresis and fever.  HENT:  Positive for congestion and rhinorrhea. Negative for sneezing.   Eyes: Negative.   Respiratory:  Positive for cough and shortness of breath. Negative for chest tightness.   Cardiovascular:  Positive for chest pain. Negative for leg swelling.  Gastrointestinal:  Positive for diarrhea, nausea and vomiting. Negative for abdominal pain and blood in stool.  Genitourinary:  Negative  for difficulty urinating, flank pain, frequency and hematuria.  Musculoskeletal:  Positive for myalgias. Negative for arthralgias and back pain.  Skin:  Negative for rash.  Neurological:  Negative for dizziness, speech difficulty, weakness, numbness and headaches.    Physical Exam Updated Vital Signs BP (!) 139/95   Pulse 80   Temp 97.8 F (36.6 C) (Oral)   Resp 19   LMP 12/16/2011   SpO2 98%  Physical Exam Constitutional:      Appearance: She is well-developed.  HENT:     Head: Normocephalic and atraumatic.  Eyes:     Pupils: Pupils are equal, round, and  reactive to light.  Cardiovascular:     Rate and Rhythm: Normal rate and regular rhythm.     Heart sounds: Normal heart sounds.  Pulmonary:     Effort: Pulmonary effort is normal. No respiratory distress.     Breath sounds: Wheezing (Few expiratory wheezes, no increased work of breathing) present. No rales.  Chest:     Chest wall: No tenderness.  Abdominal:     General: Bowel sounds are normal.     Palpations: Abdomen is soft.     Tenderness: There is no abdominal tenderness. There is no guarding or rebound.  Musculoskeletal:        General: Normal range of motion.     Cervical back: Normal range of motion and neck supple.  Lymphadenopathy:     Cervical: No cervical adenopathy.  Skin:    General: Skin is warm and dry.     Findings: No rash.  Neurological:     Mental Status: She is alert and oriented to person, place, and time.     ED Results / Procedures / Treatments   Labs (all labs ordered are listed, but only abnormal results are displayed) Labs Reviewed  COMPREHENSIVE METABOLIC PANEL - Abnormal; Notable for the following components:      Result Value   Potassium 3.3 (*)    Chloride 96 (*)    Glucose, Bld 156 (*)    Anion gap 17 (*)    All other components within normal limits  CBC - Abnormal; Notable for the following components:   WBC 17.7 (*)    RBC 5.22 (*)    Hemoglobin 16.0 (*)    HCT 47.4 (*)    All other components within normal limits  URINALYSIS, ROUTINE W REFLEX MICROSCOPIC - Abnormal; Notable for the following components:   Color, Urine AMBER (*)    APPearance CLOUDY (*)    Specific Gravity, Urine 1.042 (*)    Hgb urine dipstick SMALL (*)    Bilirubin Urine SMALL (*)    Protein, ur >=300 (*)    Bacteria, UA MANY (*)    All other components within normal limits  RESP PANEL BY RT-PCR (RSV, FLU A&B, COVID)  RVPGX2  LIPASE, BLOOD  TROPONIN I (HIGH SENSITIVITY)    EKG EKG Interpretation Date/Time:  Thursday January 13 2024 16:04:32  EST Ventricular Rate:  71 PR Interval:  136 QRS Duration:  109 QT Interval:  444 QTC Calculation: 483 R Axis:   79  Text Interpretation: Sinus rhythm Minimal ST depression, inferior leads since last tracing no significant change Reconfirmed by Rolan Bucco [phone]) on 01/13/2024 4:08:06 PM  Radiology DG Chest Portable 1 View Result Date: 01/13/2024 CLINICAL DATA:  Cough. EXAM: PORTABLE CHEST 1 VIEW COMPARISON:  09/10/2023. FINDINGS: Bilateral lung fields are clear. Bilateral costophrenic angles are clear. Normal cardio-mediastinal silhouette. No acute osseous abnormalities. The soft  tissues are within normal limits. IMPRESSION: No active disease. Electronically Signed   By: Jules Schick M.D.   On: 01/13/2024 14:46    Procedures Procedures    Medications Ordered in ED Medications  ondansetron (ZOFRAN-ODT) disintegrating tablet 4 mg (4 mg Oral Given 01/13/24 1649)  lactated ringers bolus 1,000 mL (1,000 mLs Intravenous New Bag/Given 01/13/24 1634)  ondansetron (ZOFRAN) injection 4 mg (4 mg Intravenous Given 01/13/24 1631)  ketorolac (TORADOL) 15 MG/ML injection 15 mg (15 mg Intravenous Given 01/13/24 1633)  albuterol (PROVENTIL) (2.5 MG/3ML) 0.083% nebulizer solution 5 mg (5 mg Nebulization Given 01/13/24 1635)    ED Course/ Medical Decision Making/ A&P                                 Medical Decision Making Amount and/or Complexity of Data Reviewed Labs: ordered.  Risk Prescription drug management.   Patient is a 58 year old who presents with flulike symptoms.  She has had associated nausea vomiting diarrhea.  Her COVID/flu/RSV test were negative.  Chest x-ray two-view was interpreted by me and confirmed by the radiologist to show no evidence of pneumonia.  Labs reviewed.  Her glucose was mildly elevated but she does not have any suggestions of DKA.  Her WBC count is elevated but she does not have other clinical suggestions of bacterial infections.  She says this has been elevated  in the past.  Her urine is not consistent with infection/no signs of pyelonephritis.  Her abdomen is nontender.  I do not feel that further abdominal imaging is indicated at this point.  She does not have clinical suggestions of small bowel obstruction, appendicitis, cholecystitis or other acute intra-abdominal pathology.  She was given IV fluids as well as antiemetics.  She was given an albuterol nebulizer treatment.  Overall she is feeling much better.  She does not have any hypoxia.  No ongoing shortness of breath or increased work of breathing.  No clinical suggestions of PE.  She was able to tolerate oral fluids after this without ongoing vomiting.  She was discharged home in good condition.  She was given a prescription for Zofran.  She was encouraged to follow-up with her PCP and will need to have her glucose rechecked.  Return precautions were given.  Final Clinical Impression(s) / ED Diagnoses Final diagnoses:  Viral syndrome  Nausea vomiting and diarrhea    Rx / DC Orders ED Discharge Orders          Ordered    ondansetron (ZOFRAN-ODT) 4 MG disintegrating tablet        01/13/24 1741              Rolan Bucco, MD 01/13/24 1744

## 2024-01-13 NOTE — ED Provider Triage Note (Signed)
Emergency Medicine Provider Triage Evaluation Note  Adrienne Garcia , a 58 y.o. female  was evaluated in triage.  Pt complains of viral illness. Endorse fever, chills, body aches, nausea, vomiting, diarrhea, congestion, cough x 1 week.  No sob.  Mild abd pain  Review of Systems  Positive: As above Negative: As above  Physical Exam  BP (!) 151/91 (BP Location: Left Arm)   Pulse 77   Temp 97.6 F (36.4 C) (Oral)   Resp 18   LMP 12/16/2011   SpO2 98%  Gen:   Awake, no distress   Resp:  Normal effort  MSK:   Moves extremities without difficulty  Other:    Medical Decision Making  Medically screening exam initiated at 12:09 PM.  Appropriate orders placed.  Keith Cancio was informed that the remainder of the evaluation will be completed by another provider, this initial triage assessment does not replace that evaluation, and the importance of remaining in the ED until their evaluation is complete.     Fayrene Helper, PA-C 01/13/24 1210

## 2024-01-13 NOTE — Discharge Instructions (Addendum)
Your glucose was mildly elevated.  This needs to be rechecked by your primary care doctor.

## 2024-01-17 ENCOUNTER — Telehealth: Payer: Self-pay | Admitting: *Deleted

## 2024-01-17 NOTE — Telephone Encounter (Signed)
 Pt called regarding Rx for albuterol inhaler.  RNC reviewed chart and did not find that Rx for albuterol was ordered.  Relayed information to pt.

## 2024-01-29 DIAGNOSIS — Z419 Encounter for procedure for purposes other than remedying health state, unspecified: Secondary | ICD-10-CM | POA: Diagnosis not present

## 2024-02-09 ENCOUNTER — Telehealth: Payer: Self-pay | Admitting: Orthopaedic Surgery

## 2024-02-09 NOTE — Telephone Encounter (Signed)
 Adrienne Garcia from [facility] needs a signature and date for the plan of care that has been sent over several times. Her call back #[phone]

## 2024-02-09 NOTE — Telephone Encounter (Signed)
 Everything given to [place] has been signed and sent back for faxing.

## 2024-03-11 DIAGNOSIS — Z419 Encounter for procedure for purposes other than remedying health state, unspecified: Secondary | ICD-10-CM | POA: Diagnosis not present

## 2024-03-13 DIAGNOSIS — Z0120 Encounter for dental examination and cleaning without abnormal findings: Secondary | ICD-10-CM | POA: Diagnosis not present

## 2024-03-20 ENCOUNTER — Ambulatory Visit: Payer: Self-pay

## 2024-03-20 NOTE — Telephone Encounter (Signed)
 Chief Complaint: Left knee arthritic pain/Eyelid swelling Symptoms: see above Frequency: Knee = 5 months, Eye = 1 week Pertinent Negatives: Patient denies see notes Disposition: [] ED /[] Urgent Care (no appt availability in office) / [x] Appointment(In office/virtual)/ []  [facility]/ [] Home Care/ [] Refused Recommended Disposition /[] [facility]/ []  Follow-up with PCP Additional Notes: Patient called in requesting appt to see PCP about left knee pain with arthritis and need for pain injection. Patient also states that she had something fly into her eye while working in her shed a week ago and has eyelid swelling and pain, but no signs of infection. Patient states Friday appt slot will work best. Advised patient to call back if she develops a fever or colored drainage from her eye.    Copied from CRM [id number]. Topic: Clinical - Red Word Triage >> Mar 20, 2024  8:59 AM Rennis Case wrote: Red Word that prompted transfer to Nurse Triage: pain in right knee, requesting appointment for injection to knee Reason for Disposition  [1] MODERATE pain (e.g., interferes with normal activities, limping) AND [2] present > 3 days  Answer Assessment - Initial Assessment Questions 1. LOCATION and RADIATION: "Where is the pain located?"      Right knee 2. QUALITY: "What does the pain feel like?"  (e.g., sharp, dull, aching, burning)     Arthritic pain 3. SEVERITY: "How bad is the pain?" "What does it keep you from doing?"   (Scale 1-10; or mild, moderate, severe)   -  MILD (1-3): doesn't interfere with normal activities    -  MODERATE (4-7): interferes with normal activities (e.g., work or school) or awakens from sleep, limping    -  SEVERE (8-10): excruciating pain, unable to do any normal activities, unable to walk     "Feels bone to bone, gets severe when walking" 4. ONSET: "When did the pain start?" "Does it come and go, or is it there all the time?"     5 months 5. RECURRENT: "Have you  had this pain before?" If Yes, ask: "When, and what happened then?"     Yes in other knee - had total knee replacement in left knee 6. SETTING: "Has there been any recent work, exercise or other activity that involved that part of the body?"      Arthritis 7. AGGRAVATING FACTORS: "What makes the knee pain worse?" (e.g., walking, climbing stairs, running)     Walking  8. ASSOCIATED SYMPTOMS: "Is there any swelling or redness of the knee?"     No 9. OTHER SYMPTOMS: "Do you have any other symptoms?" (e.g., chest pain, difficulty breathing, fever, calf pain)     No  Answer Assessment - Initial Assessment Questions 1. MECHANISM: "How did the injury happen?"      Something flew into top lid while working by the shed 2. ONSET: "When did the injury happen?" (Minutes or hours ago)      Sunday before yesterday 3. LOCATION: "What part of the eye is injured?" (cornea, sclera, eyelid, or periorbital tissue)     Top eyelid 4. APPEARANCE: "What does the eye look like?"      Top eyelid is swollen 5. VISION: "Is the vision blurred?"      No 6. PAIN: "Is it painful?" If Yes, ask: "How bad is the pain?"   (e.g., Scale 1-10; or mild, moderate, severe)     N/a 7. SIZE: For cuts, bruises, or swelling, ask: "How large is it?" (e.g., inches or centimeters)  N/a 8. TETANUS: For any breaks in the skin, ask: "When was the last tetanus booster?"     No 9. CONTACTS: "Do you wear contacts?"        No 10. OTHER SYMPTOMS: "Do you have any other symptoms?" (e.g., headache, neck pain, vomiting)       Eyelid is puffy, pus on top lid  Protocols used: Knee Pain-A-AH, Eye Injury-A-AH

## 2024-03-24 ENCOUNTER — Ambulatory Visit: Admitting: Family Medicine

## 2024-03-24 ENCOUNTER — Encounter: Payer: Self-pay | Admitting: Family Medicine

## 2024-03-24 ENCOUNTER — Other Ambulatory Visit: Payer: Self-pay

## 2024-03-24 VITALS — BP 120/80 | HR 56 | Temp 98.0°F | Ht 66.0 in | Wt 211.0 lb

## 2024-03-24 DIAGNOSIS — R739 Hyperglycemia, unspecified: Secondary | ICD-10-CM | POA: Diagnosis not present

## 2024-03-24 DIAGNOSIS — K219 Gastro-esophageal reflux disease without esophagitis: Secondary | ICD-10-CM

## 2024-03-24 DIAGNOSIS — M25561 Pain in right knee: Secondary | ICD-10-CM

## 2024-03-24 MED ORDER — PANTOPRAZOLE SODIUM 40 MG PO TBEC
40.0000 mg | DELAYED_RELEASE_TABLET | ORAL | 1 refills | Status: DC | PRN
  Filled 2024-03-24: qty 90, 90d supply, fill #0

## 2024-03-24 MED ORDER — MELOXICAM 15 MG PO TABS
15.0000 mg | ORAL_TABLET | Freq: Every day | ORAL | 3 refills | Status: DC
  Filled 2024-03-24: qty 30, 30d supply, fill #0

## 2024-03-24 MED ORDER — ALBUTEROL SULFATE HFA 108 (90 BASE) MCG/ACT IN AERS
2.0000 | INHALATION_SPRAY | Freq: Four times a day (QID) | RESPIRATORY_TRACT | 3 refills | Status: DC | PRN
  Filled 2024-03-24: qty 18, 25d supply, fill #0

## 2024-03-24 MED ORDER — TRIAMCINOLONE ACETONIDE 40 MG/ML IJ SUSP
80.0000 mg | Freq: Once | INTRAMUSCULAR | Status: AC
  Administered 2024-03-24: 80 mg via INTRA_ARTICULAR

## 2024-03-24 MED ORDER — PROMETHAZINE HCL 25 MG PO TABS
25.0000 mg | ORAL_TABLET | Freq: Four times a day (QID) | ORAL | 1 refills | Status: DC | PRN
  Filled 2024-03-24: qty 10, 3d supply, fill #0

## 2024-03-24 NOTE — Addendum Note (Signed)
 Addended by: Gillermo Lack K on: 03/24/2024 12:54 PM   Modules accepted: Orders

## 2024-03-24 NOTE — Progress Notes (Signed)
 Subjective:    Patient ID: Adrienne Garcia, female    DOB: [birth year], 58 y.o.   MRN: [id number]  Knee Pain     Patient presents today complaining of right knee pain.  She has a history of osteoarthritis in the left knee requiring knee replacement.  She also has a history of osteoarthritis in the right knee causing knee pain.  She would like to take an arthritis pill.  She is interested in trying meloxicam .  However she still has to court inject to assist with pain in the right knee.  She reports pain with ambulation and going up and down steps.  She is overdue for fasting lab work.  She states that she recently checked her blood sugar and it was over 180.  She is well overdue for a hemoglobin A1c along with a fasting lipid panel. Past Medical History:  Diagnosis Date   Anemia    Anxiety    Arthritis    knees   Asthma    Basal cell carcinoma of neck    "front of my neck; it was a melanoma"   Bipolar disorder (HCC)    COPD (chronic obstructive pulmonary disease) (HCC)    Daily headache    Depression    Family history of adverse reaction to anesthesia    MOM-HARD TO WAKE UP   Fibromyalgia    GERD (gastroesophageal reflux disease)    Heart murmur    H/O   History of hiatal hernia    SMALL   Intractable nausea and vomiting    Migraine    "probably twice/month" (09/19/2015)   Obesity    PTSD (post-traumatic stress disorder)    Thyroid  disease    Past Surgical History:  Procedure Laterality Date   ABDOMINAL HYSTERECTOMY     CYSTOCELE REPAIR N/A 09/24/2020   Procedure: ANTERIOR REPAIR (CYSTOCELE);  Surgeon: Alben Alma, MD;  Location: [facility] ORS;  Service: Gynecology;  Laterality: N/A;   MOLE REMOVAL  ~ 2013   "front side of my neck"   MULTIPLE TOOTH EXTRACTIONS  ~ 2011   TOTAL KNEE ARTHROPLASTY Left 09/06/2023   Procedure: LEFT TOTAL KNEE ARTHROPLASTY;  Surgeon: Wes Hamman, MD;  Location: [facility];  Service: Orthopedics;  Laterality: Left;   VAGINAL HYSTERECTOMY Bilateral  09/24/2020   Procedure: HYSTERECTOMY VAGINAL BILATERAL SALPINGO-OOPHERECTOMY;  Surgeon: Alben Alma, MD;  Location: [facility] ORS;  Service: Gynecology;  Laterality: Bilateral;   Current Outpatient Medications on File Prior to Visit  Medication Sig Dispense Refill   aspirin  EC 81 MG tablet Take 1 tablet (81 mg total) by mouth 2 (two) times daily. To be taken after surgery to prevent blood clots 120 tablet 0   docusate sodium  (COLACE) 100 MG capsule Take 1 capsule (100 mg total) by mouth daily as needed. 100 capsule 2   HYDROcodone -acetaminophen  (NORCO) 5-325 MG tablet Take 1-2 tablets by mouth 3 (three) times daily as needed. 30 tablet 0   methocarbamol  (ROBAXIN -750) 750 MG tablet Take 1 tablet (750 mg total) by mouth 2 (two) times daily as needed for muscle spasms. 20 tablet 2   methocarbamol  (ROBAXIN -750) 750 MG tablet Take 1 tablet (750 mg total) by mouth 2 (two) times daily as needed for muscle spasms. (Patient not taking: Reported on 09/28/2023) 20 tablet 2   ondansetron  (ZOFRAN ) 4 MG tablet Take 1 tablet (4 mg total) by mouth every 6 (six) hours. 12 tablet 0   ondansetron  (ZOFRAN -ODT) 4 MG disintegrating tablet 4mg  ODT q4 hours prn  nausea/vomit 6 tablet 0   oxyCODONE -acetaminophen  (PERCOCET) 5-325 MG tablet Take 1-2 tablets by mouth every 6 (six) hours as needed. To be taken after surgery (Patient not taking: Reported on 09/28/2023) 40 tablet 0   [DISCONTINUED] potassium chloride  (K-DUR) 10 MEQ tablet Take 1 tablet (10 mEq total) by mouth daily. 30 tablet 0   No current facility-administered medications on file prior to visit.   Allergies  Allergen Reactions   Ciprofloxacin Hives   Social History   Socioeconomic History   Marital status: Divorced    Spouse name: Not on file   Number of children: 2   Years of education: Not on file   Highest education level: Not on file  Occupational History   Not on file  Tobacco Use   Smoking status: Former    Current packs/day: 0.00     Average packs/day: 0.5 packs/day for 28.0 years (14.0 ttl pk-yrs)    Types: Cigarettes    Start date: 09/17/1985    Quit date: 09/17/2013    Years since quitting: 10.5   Smokeless tobacco: Never   Tobacco comments:    "quit smoking ~ 2014"  Vaping Use   Vaping status: Never Used  Substance and Sexual Activity   Alcohol use: No   Drug use: Not Currently    Types: Marijuana    Comment: 09/19/2015 "probably twice/yr":   Sexual activity: Not Currently  Other Topics Concern   Not on file  Social History Narrative   Not on file   Social Drivers of Health   Financial Resource Strain: Not on file  Food Insecurity: No Food Insecurity (09/06/2023)   Hunger Vital Sign    Worried About Running Out of Food in the Last Year: Never true    Ran Out of Food in the Last Year: Never true  Transportation Needs: No Transportation Needs (09/06/2023)   PRAPARE - Administrator, Civil Service (Medical): No    Lack of Transportation (Non-Medical): No  Physical Activity: Not on file  Stress: Not on file  Social Connections: Not on file  Intimate Partner Violence: Not on file     Review of Systems  All other systems reviewed and are negative.      Objective:   Physical Exam Vitals reviewed.  Constitutional:      Appearance: Normal appearance. She is obese.  Cardiovascular:     Rate and Rhythm: Normal rate and regular rhythm.     Heart sounds: Normal heart sounds. No murmur heard.    No gallop.  Pulmonary:     Effort: Pulmonary effort is normal. No respiratory distress.     Breath sounds: Normal breath sounds. No wheezing, rhonchi or rales.  Musculoskeletal:     Right knee: Decreased range of motion. Tenderness present over the medial joint line and lateral joint line.  Neurological:     Mental Status: She is alert.           Assessment & Plan:   High blood sugar - Plan: CBC with Differential/Platelet, COMPLETE METABOLIC PANEL WITHOUT GFR, Lipid panel, Hemoglobin  A1c  Gastroesophageal reflux disease, unspecified whether esophagitis present - Plan: pantoprazole  (PROTONIX ) 40 MG tablet, promethazine  (PHENERGAN ) 25 MG tablet  Acute pain of right knee Patient requests a refill on Protonix  that she uses for acid reflux.  She also requests a refill on her Phenergan  that she uses occasionally for nausea.  I gave the patient a prescription for meloxicam  15 mg daily osteoarthritis.  Postulation daily ulcers.  I recommended that the patient use the medication sparingly.  I will check her fasting lab work including CBC, CMP, lipid panel, and an A1c especially given her elevated blood sugar.  Using sterile technique, I injected the right knee with 2 cc of lidocaine , 2 cc of Marcaine , and 2 cc of 40 mg/mL Kenalog .

## 2024-03-25 LAB — CBC WITH DIFFERENTIAL/PLATELET
Absolute Lymphocytes: 3254 {cells}/uL (ref 850–3900)
Absolute Monocytes: 670 {cells}/uL (ref 200–950)
Basophils Absolute: 52 {cells}/uL (ref 0–200)
Basophils Relative: 0.6 %
Eosinophils Absolute: 87 {cells}/uL (ref 15–500)
Eosinophils Relative: 1 %
HCT: 40.8 % (ref 35.0–45.0)
Hemoglobin: 13.6 g/dL (ref 11.7–15.5)
MCH: 31.6 pg (ref 27.0–33.0)
MCHC: 33.3 g/dL (ref 32.0–36.0)
MCV: 94.9 fL (ref 80.0–100.0)
MPV: 13.1 fL — ABNORMAL HIGH (ref 7.5–12.5)
Monocytes Relative: 7.7 %
Neutro Abs: 4637 {cells}/uL (ref 1500–7800)
Neutrophils Relative %: 53.3 %
Platelets: 201 10*3/uL (ref 140–400)
RBC: 4.3 10*6/uL (ref 3.80–5.10)
RDW: 12.5 % (ref 11.0–15.0)
Total Lymphocyte: 37.4 %
WBC: 8.7 10*3/uL (ref 3.8–10.8)

## 2024-03-25 LAB — COMPLETE METABOLIC PANEL WITHOUT GFR
AG Ratio: 2.1 (calc) (ref 1.0–2.5)
ALT: 11 U/L (ref 6–29)
AST: 17 U/L (ref 10–35)
Albumin: 4.5 g/dL (ref 3.6–5.1)
Alkaline phosphatase (APISO): 107 U/L (ref 37–153)
BUN: 13 mg/dL (ref 7–25)
CO2: 26 mmol/L (ref 20–32)
Calcium: 9.8 mg/dL (ref 8.6–10.4)
Chloride: 104 mmol/L (ref 98–110)
Creat: 0.7 mg/dL (ref 0.50–1.03)
Globulin: 2.1 g/dL (ref 1.9–3.7)
Glucose, Bld: 89 mg/dL (ref 65–99)
Potassium: 5.3 mmol/L (ref 3.5–5.3)
Sodium: 138 mmol/L (ref 135–146)
Total Bilirubin: 0.4 mg/dL (ref 0.2–1.2)
Total Protein: 6.6 g/dL (ref 6.1–8.1)

## 2024-03-25 LAB — LIPID PANEL
Cholesterol: 215 mg/dL — ABNORMAL HIGH (ref <200)
HDL: 47 mg/dL — ABNORMAL LOW (ref >=50)
LDL Cholesterol (Calc): 137 mg/dL — ABNORMAL HIGH
Non-HDL Cholesterol (Calc): 168 mg/dL — ABNORMAL HIGH (ref <130)
Total CHOL/HDL Ratio: 4.6 (calc) (ref <5.0)
Triglycerides: 177 mg/dL — ABNORMAL HIGH (ref <150)

## 2024-03-25 LAB — HEMOGLOBIN A1C
Hgb A1c MFr Bld: 5.3 % (ref <5.7)
Mean Plasma Glucose: 105 mg/dL
eAG (mmol/L): 5.8 mmol/L

## 2024-03-27 ENCOUNTER — Telehealth: Payer: Self-pay

## 2024-03-27 ENCOUNTER — Other Ambulatory Visit: Payer: Self-pay

## 2024-03-27 MED ORDER — ROSUVASTATIN CALCIUM 10 MG PO TABS
10.0000 mg | ORAL_TABLET | Freq: Every day | ORAL | 3 refills | Status: DC
  Filled 2024-03-27: qty 90, 90d supply, fill #0

## 2024-03-27 NOTE — Telephone Encounter (Signed)
 Copied from CRM [id number]. Topic: Clinical - Lab/Test Results >> Mar 27, 2024  8:26 AM Oddis Bench wrote: Reason for CRM: Patient is calling about lab results informed that the everything is fine per notes except cholestrol Dr. Cheril Cork is recommending crestor 10 mg per day. Would like to be sent to [facility] [address] [place] [address] Phone: [phone] Fax: [phone] Hours: M-F 7:30a-6:00p

## 2024-03-27 NOTE — Telephone Encounter (Signed)
 Sent in crestor for pt per Dr Shirline Dover recommendation.

## 2024-03-29 ENCOUNTER — Other Ambulatory Visit: Payer: Self-pay

## 2024-03-31 ENCOUNTER — Other Ambulatory Visit: Payer: Self-pay | Admitting: Family Medicine

## 2024-03-31 DIAGNOSIS — K219 Gastro-esophageal reflux disease without esophagitis: Secondary | ICD-10-CM

## 2024-03-31 NOTE — Telephone Encounter (Signed)
 Prescription Request  03/31/2024  LOV: 03/24/2024  What is the name of the medication or equipment? Pantorazole sod dr 40 mg tab 90 day  Have you contacted your pharmacy to request a refill? Yes   Which pharmacy would you like this sent to?  [id number] Jonette Nestle, [place] - [address] [place]  [address] Phone: [phone] Fax: [phone]    Patient notified that their request is being sent to the clinical staff for review and that they should receive a response within 2 business days.   Please advise at [facility] [phone]

## 2024-04-03 NOTE — Telephone Encounter (Signed)
 Too soon for refill.  Requested Prescriptions  Pending Prescriptions Disp Refills   pantoprazole  (PROTONIX ) 40 MG tablet 90 tablet 1    Sig: Take 1 tablet (40 mg total) by mouth as needed.     Gastroenterology: Proton Pump Inhibitors Passed - 04/03/2024  4:12 PM      Passed - Valid encounter within last 12 months    Recent Outpatient Visits           1 week ago High blood sugar   [facility] Austine Lefort, MD   9 months ago Chronic pain of both knees   [facility] Cheril Cork, Cisco Crest, MD   1 year ago Primary osteoarthritis of right knee   [place] [facility] Austine Lefort, MD   1 year ago Low blood potassium   [facility] Pickard, Cisco Crest, MD

## 2024-04-10 DIAGNOSIS — Z419 Encounter for procedure for purposes other than remedying health state, unspecified: Secondary | ICD-10-CM | POA: Diagnosis not present

## 2024-04-11 ENCOUNTER — Other Ambulatory Visit: Payer: Self-pay

## 2024-04-11 ENCOUNTER — Inpatient Hospital Stay (HOSPITAL_COMMUNITY)
Admission: EM | Admit: 2024-04-11 | Discharge: 2024-04-13 | DRG: 643 | Disposition: A | Discharge status: Home / Self Care | Attending: Internal Medicine | Admitting: Internal Medicine

## 2024-04-11 ENCOUNTER — Emergency Department (HOSPITAL_COMMUNITY)

## 2024-04-11 ENCOUNTER — Encounter (HOSPITAL_COMMUNITY): Payer: Self-pay | Admitting: Internal Medicine

## 2024-04-11 DIAGNOSIS — R112 Nausea with vomiting, unspecified: Secondary | ICD-10-CM | POA: Diagnosis present

## 2024-04-11 DIAGNOSIS — Z881 Allergy status to other antibiotic agents status: Secondary | ICD-10-CM

## 2024-04-11 DIAGNOSIS — J690 Pneumonitis due to inhalation of food and vomit: Secondary | ICD-10-CM | POA: Diagnosis not present

## 2024-04-11 DIAGNOSIS — R809 Proteinuria, unspecified: Secondary | ICD-10-CM | POA: Diagnosis present

## 2024-04-11 DIAGNOSIS — Z87891 Personal history of nicotine dependence: Secondary | ICD-10-CM

## 2024-04-11 DIAGNOSIS — E871 Hypo-osmolality and hyponatremia: Principal | ICD-10-CM | POA: Diagnosis present

## 2024-04-11 DIAGNOSIS — Z79899 Other long term (current) drug therapy: Secondary | ICD-10-CM

## 2024-04-11 DIAGNOSIS — M797 Fibromyalgia: Secondary | ICD-10-CM | POA: Diagnosis not present

## 2024-04-11 DIAGNOSIS — Z85828 Personal history of other malignant neoplasm of skin: Secondary | ICD-10-CM | POA: Diagnosis not present

## 2024-04-11 DIAGNOSIS — K529 Noninfective gastroenteritis and colitis, unspecified: Secondary | ICD-10-CM | POA: Diagnosis present

## 2024-04-11 DIAGNOSIS — F419 Anxiety disorder, unspecified: Secondary | ICD-10-CM | POA: Diagnosis present

## 2024-04-11 DIAGNOSIS — E861 Hypovolemia: Secondary | ICD-10-CM | POA: Diagnosis not present

## 2024-04-11 DIAGNOSIS — Z791 Long term (current) use of non-steroidal anti-inflammatories (NSAID): Secondary | ICD-10-CM

## 2024-04-11 DIAGNOSIS — R918 Other nonspecific abnormal finding of lung field: Secondary | ICD-10-CM | POA: Diagnosis not present

## 2024-04-11 DIAGNOSIS — Z803 Family history of malignant neoplasm of breast: Secondary | ICD-10-CM | POA: Diagnosis not present

## 2024-04-11 DIAGNOSIS — W57XXXA Bitten or stung by nonvenomous insect and other nonvenomous arthropods, initial encounter: Secondary | ICD-10-CM | POA: Diagnosis present

## 2024-04-11 DIAGNOSIS — F319 Bipolar disorder, unspecified: Secondary | ICD-10-CM | POA: Diagnosis not present

## 2024-04-11 DIAGNOSIS — E876 Hypokalemia: Secondary | ICD-10-CM | POA: Diagnosis not present

## 2024-04-11 DIAGNOSIS — F4310 Post-traumatic stress disorder, unspecified: Secondary | ICD-10-CM | POA: Diagnosis present

## 2024-04-11 DIAGNOSIS — E222 Syndrome of inappropriate secretion of antidiuretic hormone: Secondary | ICD-10-CM | POA: Diagnosis not present

## 2024-04-11 DIAGNOSIS — G43909 Migraine, unspecified, not intractable, without status migrainosus: Secondary | ICD-10-CM | POA: Diagnosis present

## 2024-04-11 DIAGNOSIS — R197 Diarrhea, unspecified: Secondary | ICD-10-CM | POA: Diagnosis not present

## 2024-04-11 DIAGNOSIS — R21 Rash and other nonspecific skin eruption: Secondary | ICD-10-CM | POA: Diagnosis present

## 2024-04-11 DIAGNOSIS — R079 Chest pain, unspecified: Secondary | ICD-10-CM | POA: Diagnosis not present

## 2024-04-11 DIAGNOSIS — E66811 Obesity, class 1: Secondary | ICD-10-CM | POA: Diagnosis not present

## 2024-04-11 DIAGNOSIS — R509 Fever, unspecified: Secondary | ICD-10-CM | POA: Diagnosis not present

## 2024-04-11 DIAGNOSIS — Z6833 Body mass index (BMI) 33.0-33.9, adult: Secondary | ICD-10-CM

## 2024-04-11 DIAGNOSIS — R000 Tachycardia, unspecified: Secondary | ICD-10-CM | POA: Diagnosis present

## 2024-04-11 DIAGNOSIS — Z90710 Acquired absence of both cervix and uterus: Secondary | ICD-10-CM

## 2024-04-11 DIAGNOSIS — J4489 Other specified chronic obstructive pulmonary disease: Secondary | ICD-10-CM | POA: Diagnosis present

## 2024-04-11 DIAGNOSIS — Z85820 Personal history of malignant melanoma of skin: Secondary | ICD-10-CM | POA: Diagnosis not present

## 2024-04-11 DIAGNOSIS — K219 Gastro-esophageal reflux disease without esophagitis: Secondary | ICD-10-CM | POA: Diagnosis present

## 2024-04-11 DIAGNOSIS — E785 Hyperlipidemia, unspecified: Secondary | ICD-10-CM | POA: Diagnosis not present

## 2024-04-11 DIAGNOSIS — E860 Dehydration: Secondary | ICD-10-CM | POA: Diagnosis not present

## 2024-04-11 DIAGNOSIS — Z833 Family history of diabetes mellitus: Secondary | ICD-10-CM | POA: Diagnosis not present

## 2024-04-11 DIAGNOSIS — Z882 Allergy status to sulfonamides status: Secondary | ICD-10-CM

## 2024-04-11 DIAGNOSIS — Z8249 Family history of ischemic heart disease and other diseases of the circulatory system: Secondary | ICD-10-CM

## 2024-04-11 DIAGNOSIS — Z96652 Presence of left artificial knee joint: Secondary | ICD-10-CM | POA: Diagnosis present

## 2024-04-11 LAB — URINALYSIS, ROUTINE W REFLEX MICROSCOPIC
Bacteria, UA: NONE SEEN
Bilirubin Urine: NEGATIVE
Cellular Cast, UA: 2
Glucose, UA: NEGATIVE mg/dL
Ketones, ur: NEGATIVE mg/dL
Nitrite: NEGATIVE
Protein, ur: >=300 mg/dL — AB
Specific Gravity, Urine: 1.024 (ref 1.005–1.030)
pH: 5 (ref 5.0–8.0)

## 2024-04-11 LAB — COMPREHENSIVE METABOLIC PANEL WITH GFR
ALT: 42 U/L (ref 0–44)
AST: 60 U/L — ABNORMAL HIGH (ref 15–41)
Albumin: 2.9 g/dL — ABNORMAL LOW (ref 3.5–5.0)
Alkaline Phosphatase: 74 U/L (ref 38–126)
Anion gap: 14 (ref 5–15)
BUN: 23 mg/dL — ABNORMAL HIGH (ref 6–20)
CO2: 23 mmol/L (ref 22–32)
Calcium: 8.5 mg/dL — ABNORMAL LOW (ref 8.9–10.3)
Chloride: 82 mmol/L — ABNORMAL LOW (ref 98–111)
Creatinine, Ser: 0.93 mg/dL (ref 0.44–1.00)
GFR, Estimated: >60 mL/min (ref >60)
Glucose, Bld: 129 mg/dL — ABNORMAL HIGH (ref 70–99)
Potassium: 2.9 mmol/L — ABNORMAL LOW (ref 3.5–5.1)
Sodium: 119 mmol/L — CL (ref 135–145)
Total Bilirubin: 0.9 mg/dL (ref 0.0–1.2)
Total Protein: 6.8 g/dL (ref 6.5–8.1)

## 2024-04-11 LAB — BASIC METABOLIC PANEL WITH GFR
Anion gap: 12 (ref 5–15)
Anion gap: 13 (ref 5–15)
BUN: 16 mg/dL (ref 6–20)
BUN: 14 mg/dL (ref 6–20)
CO2: 23 mmol/L (ref 22–32)
CO2: 21 mmol/L — ABNORMAL LOW (ref 22–32)
Calcium: 7.9 mg/dL — ABNORMAL LOW (ref 8.9–10.3)
Calcium: 8.6 mg/dL — ABNORMAL LOW (ref 8.9–10.3)
Chloride: 89 mmol/L — ABNORMAL LOW (ref 98–111)
Chloride: 96 mmol/L — ABNORMAL LOW (ref 98–111)
Creatinine, Ser: 0.72 mg/dL (ref 0.44–1.00)
Creatinine, Ser: 0.67 mg/dL (ref 0.44–1.00)
GFR, Estimated: >60 mL/min (ref >60)
GFR, Estimated: >60 mL/min (ref >60)
Glucose, Bld: 98 mg/dL (ref 70–99)
Glucose, Bld: 82 mg/dL (ref 70–99)
Potassium: 3.3 mmol/L — ABNORMAL LOW (ref 3.5–5.1)
Potassium: 3.6 mmol/L (ref 3.5–5.1)
Sodium: 124 mmol/L — ABNORMAL LOW (ref 135–145)
Sodium: 130 mmol/L — ABNORMAL LOW (ref 135–145)

## 2024-04-11 LAB — CBC WITH DIFFERENTIAL/PLATELET
Abs Immature Granulocytes: 0.11 10*3/uL — ABNORMAL HIGH (ref 0.00–0.07)
Basophils Absolute: 0.1 10*3/uL (ref 0.0–0.1)
Basophils Relative: 0 %
Eosinophils Absolute: 0.1 10*3/uL (ref 0.0–0.5)
Eosinophils Relative: 0 %
HCT: 43.7 % (ref 36.0–46.0)
Hemoglobin: 15.6 g/dL — ABNORMAL HIGH (ref 12.0–15.0)
Immature Granulocytes: 1 %
Lymphocytes Relative: 6 %
Lymphs Abs: 0.8 10*3/uL (ref 0.7–4.0)
MCH: 31.3 pg (ref 26.0–34.0)
MCHC: 35.7 g/dL (ref 30.0–36.0)
MCV: 87.8 fL (ref 80.0–100.0)
Monocytes Absolute: 0.8 10*3/uL (ref 0.1–1.0)
Monocytes Relative: 6 %
Neutro Abs: 11.7 10*3/uL — ABNORMAL HIGH (ref 1.7–7.7)
Neutrophils Relative %: 87 %
Platelets: 167 10*3/uL (ref 150–400)
RBC: 4.98 MIL/uL (ref 3.87–5.11)
RDW: 13.2 % (ref 11.5–15.5)
WBC: 13.6 10*3/uL — ABNORMAL HIGH (ref 4.0–10.5)
nRBC: 0 % (ref 0.0–0.2)

## 2024-04-11 LAB — SODIUM, URINE, RANDOM: Sodium, Ur: 14 mmol/L

## 2024-04-11 LAB — I-STAT CG4 LACTIC ACID, ED
Lactic Acid, Venous: 1.3 mmol/L (ref 0.5–1.9)
Lactic Acid, Venous: 0.8 mmol/L (ref 0.5–1.9)

## 2024-04-11 LAB — LIPASE, BLOOD: Lipase: 28 U/L (ref 11–51)

## 2024-04-11 LAB — OSMOLALITY, URINE: Osmolality, Ur: 473 mosm/kg (ref 300–900)

## 2024-04-11 LAB — OSMOLALITY: Osmolality: 263 mosm/kg — ABNORMAL LOW (ref 275–295)

## 2024-04-11 LAB — MRSA NEXT GEN BY PCR, NASAL: MRSA by PCR Next Gen: NOT DETECTED

## 2024-04-11 LAB — MAGNESIUM: Magnesium: 1.8 mg/dL (ref 1.7–2.4)

## 2024-04-11 MED ORDER — ALBUTEROL SULFATE (2.5 MG/3ML) 0.083% IN NEBU: 2.5000 mg | INHALATION_SOLUTION | RESPIRATORY_TRACT | Status: DC | PRN

## 2024-04-11 MED ORDER — POTASSIUM CHLORIDE 10 MEQ/100ML IV SOLN
10.0000 meq | INTRAVENOUS | Status: AC
  Administered 2024-04-11 (×4): 10 meq via INTRAVENOUS
  Filled 2024-04-11 (×4): qty 100

## 2024-04-11 MED ORDER — ENOXAPARIN SODIUM 40 MG/0.4ML IJ SOSY
40.0000 mg | PREFILLED_SYRINGE | Freq: Every day | INTRAMUSCULAR | Status: DC
  Administered 2024-04-11 – 2024-04-13 (×3): 40 mg via SUBCUTANEOUS
  Filled 2024-04-11 (×3): qty 0.4

## 2024-04-11 MED ORDER — ONDANSETRON HCL 4 MG/2ML IJ SOLN
4.0000 mg | Freq: Four times a day (QID) | INTRAMUSCULAR | Status: DC | PRN
  Administered 2024-04-11 – 2024-04-12 (×2): 4 mg via INTRAVENOUS
  Filled 2024-04-11 (×2): qty 2

## 2024-04-11 MED ORDER — HYDROMORPHONE HCL 1 MG/ML IJ SOLN
0.5000 mg | Freq: Once | INTRAMUSCULAR | Status: AC
  Administered 2024-04-11: 0.5 mg via INTRAVENOUS
  Filled 2024-04-11: qty 1

## 2024-04-11 MED ORDER — SODIUM CHLORIDE 0.9 % IV BOLUS
1000.0000 mL | Freq: Once | INTRAVENOUS | Status: AC
  Administered 2024-04-11: 1000 mL via INTRAVENOUS

## 2024-04-11 MED ORDER — PANTOPRAZOLE SODIUM 40 MG IV SOLR
40.0000 mg | INTRAVENOUS | Status: DC
  Administered 2024-04-11 – 2024-04-13 (×3): 40 mg via INTRAVENOUS
  Filled 2024-04-11 (×3): qty 10

## 2024-04-11 MED ORDER — ONDANSETRON HCL 4 MG/2ML IJ SOLN
4.0000 mg | Freq: Once | INTRAMUSCULAR | Status: AC | PRN
  Administered 2024-04-11: 4 mg via INTRAVENOUS
  Filled 2024-04-11: qty 2

## 2024-04-11 MED ORDER — ACETAMINOPHEN 650 MG RE SUPP: 650.0000 mg | Freq: Four times a day (QID) | RECTAL | Status: DC | PRN

## 2024-04-11 MED ORDER — TRAZODONE HCL 50 MG PO TABS
25.0000 mg | ORAL_TABLET | Freq: Every evening | ORAL | Status: DC | PRN
  Administered 2024-04-11 – 2024-04-12 (×2): 25 mg via ORAL
  Filled 2024-04-11 (×2): qty 1

## 2024-04-11 MED ORDER — HYOSCYAMINE SULFATE 0.125 MG SL SUBL
0.2500 mg | SUBLINGUAL_TABLET | Freq: Once | SUBLINGUAL | Status: AC
  Administered 2024-04-11: 0.25 mg via SUBLINGUAL
  Filled 2024-04-11: qty 2

## 2024-04-11 MED ORDER — CHLORHEXIDINE GLUCONATE CLOTH 2 % EX PADS
6.0000 | MEDICATED_PAD | Freq: Every day | CUTANEOUS | Status: DC
  Administered 2024-04-11 – 2024-04-13 (×3): 6 via TOPICAL

## 2024-04-11 MED ORDER — ALUM & MAG HYDROXIDE-SIMETH 200-200-20 MG/5ML PO SUSP
15.0000 mL | ORAL | Status: DC | PRN
  Administered 2024-04-11 – 2024-04-13 (×2): 15 mL via ORAL
  Filled 2024-04-11 (×2): qty 30

## 2024-04-11 MED ORDER — SODIUM CHLORIDE 0.9 % IV SOLN: 500.0000 mg | INTRAVENOUS | Status: DC

## 2024-04-11 MED ORDER — ORAL CARE MOUTH RINSE: 15.0000 mL | OROMUCOSAL | Status: DC | PRN

## 2024-04-11 MED ORDER — MORPHINE SULFATE (PF) 2 MG/ML IV SOLN
1.0000 mg | INTRAVENOUS | Status: DC | PRN
  Administered 2024-04-11 – 2024-04-13 (×7): 1 mg via INTRAVENOUS
  Filled 2024-04-11 (×7): qty 1

## 2024-04-11 MED ORDER — POTASSIUM CHLORIDE 10 MEQ/100ML IV SOLN
10.0000 meq | INTRAVENOUS | Status: AC
  Administered 2024-04-11 (×3): 10 meq via INTRAVENOUS
  Filled 2024-04-11 (×3): qty 100

## 2024-04-11 MED ORDER — ONDANSETRON HCL 4 MG PO TABS
4.0000 mg | ORAL_TABLET | Freq: Four times a day (QID) | ORAL | Status: DC | PRN
Start: 2024-04-11 — End: 2024-04-13
  Administered 2024-04-12 – 2024-04-13 (×2): 4 mg via ORAL
  Filled 2024-04-11 (×3): qty 1

## 2024-04-11 MED ORDER — SODIUM CHLORIDE 0.9 % IV SOLN
1.0000 g | Freq: Once | INTRAVENOUS | Status: AC
  Administered 2024-04-11: 1 g via INTRAVENOUS
  Filled 2024-04-11: qty 10

## 2024-04-11 MED ORDER — SODIUM CHLORIDE 0.9 % IV SOLN
500.0000 mg | Freq: Once | INTRAVENOUS | Status: AC
  Administered 2024-04-11: 500 mg via INTRAVENOUS
  Filled 2024-04-11: qty 5

## 2024-04-11 MED ORDER — ACETAMINOPHEN 325 MG PO TABS
650.0000 mg | ORAL_TABLET | Freq: Four times a day (QID) | ORAL | Status: DC | PRN
  Administered 2024-04-11: 650 mg via ORAL
  Filled 2024-04-11: qty 2

## 2024-04-11 MED ORDER — SODIUM CHLORIDE 0.9 % IV SOLN
25.0000 mg | Freq: Four times a day (QID) | INTRAVENOUS | Status: DC | PRN
  Administered 2024-04-11 – 2024-04-12 (×4): 25 mg via INTRAVENOUS
  Filled 2024-04-11: qty 1
  Filled 2024-04-11: qty 25
  Filled 2024-04-11: qty 1
  Filled 2024-04-11: qty 25

## 2024-04-11 MED ORDER — SODIUM CHLORIDE 0.9 % IV SOLN
1.0000 g | INTRAVENOUS | Status: DC
  Administered 2024-04-12 – 2024-04-13 (×2): 1 g via INTRAVENOUS
  Filled 2024-04-11 (×2): qty 10

## 2024-04-11 MED ORDER — SODIUM CHLORIDE 0.9 % IV SOLN: INTRAVENOUS | Status: AC

## 2024-04-11 NOTE — ED Triage Notes (Signed)
 Pt reports lower abdominal pain that started Thursday. Pain associated with NVD, fevers of 102 at yesterday night, chills, and generalized weakness.

## 2024-04-11 NOTE — H&P (Signed)
 History and Physical  Adrienne Garcia WUJ:[id number] DOB: 06/04/1966 DOA: 04/11/2024  PCP: Austine Lefort, MD   Chief Complaint: Vomiting, weakness  HPI: Adrienne Garcia is a 58 y.o. female with medical history significant for GERD, depression, anxiety being admitted to the hospital with 5 days of intractable nausea and vomiting, found to have severe hyponatremia.  Patient states that about 5 days ago she had a fever up to 102, since that time has been nauseous and vomiting.  Has been unable to keep anything down, even ginger ale.  She has also been having diarrhea.  Since that time, she has not really had any subjective fevers or chills.  She lives with her boyfriend, he has not been sick at all.  He denies any somnolence, confusion, headache, blurry vision.  Additionally, she states that for the past 5 days she has had a cough and some subjective shortness of breath.  Cough is productive of thick yellow sputum.  Review of Systems: Please see HPI for pertinent positives and negatives. A complete 10 system review of systems are otherwise negative.  Past Medical History:  Diagnosis Date   Anemia    Anxiety    Arthritis    knees   Asthma    Basal cell carcinoma of neck    "front of my neck; it was a melanoma"   Bipolar disorder (HCC)    COPD (chronic obstructive pulmonary disease) (HCC)    Daily headache    Depression    Family history of adverse reaction to anesthesia    MOM-HARD TO WAKE UP   Fibromyalgia    GERD (gastroesophageal reflux disease)    Heart murmur    H/O   History of hiatal hernia    SMALL   Intractable nausea and vomiting    Migraine    "probably twice/month" (09/19/2015)   Obesity    PTSD (post-traumatic stress disorder)    Thyroid  disease    Past Surgical History:  Procedure Laterality Date   ABDOMINAL HYSTERECTOMY     CYSTOCELE REPAIR N/A 09/24/2020   Procedure: ANTERIOR REPAIR (CYSTOCELE);  Surgeon: Alben Alma, MD;  Location: [facility] ORS;  Service:  Gynecology;  Laterality: N/A;   MOLE REMOVAL  ~ 2013   "front side of my neck"   MULTIPLE TOOTH EXTRACTIONS  ~ 2011   TOTAL KNEE ARTHROPLASTY Left 09/06/2023   Procedure: LEFT TOTAL KNEE ARTHROPLASTY;  Surgeon: Wes Hamman, MD;  Location: [facility];  Service: Orthopedics;  Laterality: Left;   VAGINAL HYSTERECTOMY Bilateral 09/24/2020   Procedure: HYSTERECTOMY VAGINAL BILATERAL SALPINGO-OOPHERECTOMY;  Surgeon: Alben Alma, MD;  Location: [facility] ORS;  Service: Gynecology;  Laterality: Bilateral;   Social History:  reports that she quit smoking about 10 years ago. Her smoking use included cigarettes. She started smoking about 38 years ago. She has a 14 pack-year smoking history. She has never used smokeless tobacco. She reports that she does not currently use drugs after having used the following drugs: Marijuana. She reports that she does not drink alcohol.  Allergies  Allergen Reactions   Ciprofloxacin Hives    Family History  Problem Relation Age of Onset   Heart attack Mother    CAD Mother    Breast cancer Sister    Hypertension Other        sibling   Diabetes Other        sibling   Colon cancer Neg Hx    Colon polyps Neg Hx    Esophageal cancer Neg Hx  Rectal cancer Neg Hx    Stomach cancer Neg Hx      Prior to Admission medications   Medication Sig Start Date End Date Taking? Authorizing Provider  albuterol  (VENTOLIN  HFA) 108 (90 Base) MCG/ACT inhaler Inhale 2 puffs into the lungs every 6 (six) hours as needed for wheezing or shortness of breath. 03/24/24   Austine Lefort, MD  aspirin  EC 81 MG tablet Take 1 tablet (81 mg total) by mouth 2 (two) times daily. To be taken after surgery to prevent blood clots 08/31/23 08/30/24  Sandie Cross, PA-C  docusate sodium  (COLACE) 100 MG capsule Take 1 capsule (100 mg total) by mouth daily as needed. 08/31/23 08/30/24  Sandie Cross, PA-C  HYDROcodone -acetaminophen  (NORCO) 5-325 MG tablet Take 1-2 tablets by mouth 3 (three) times  daily as needed. 09/21/23   Sandie Cross, PA-C  meloxicam  (MOBIC ) 15 MG tablet Take 1 tablet (15 mg total) by mouth daily. 03/24/24   Austine Lefort, MD  methocarbamol  (ROBAXIN -750) 750 MG tablet Take 1 tablet (750 mg total) by mouth 2 (two) times daily as needed for muscle spasms. 08/31/23   Sandie Cross, PA-C  methocarbamol  (ROBAXIN -750) 750 MG tablet Take 1 tablet (750 mg total) by mouth 2 (two) times daily as needed for muscle spasms. Patient not taking: Reported on 09/28/2023 09/21/23   Sandie Cross, PA-C  ondansetron  (ZOFRAN ) 4 MG tablet Take 1 tablet (4 mg total) by mouth every 6 (six) hours. 09/10/23   Clem Currier, DO  ondansetron  (ZOFRAN -ODT) 4 MG disintegrating tablet 4mg  ODT q4 hours prn nausea/vomit 01/13/24   Hershel Los, MD  oxyCODONE -acetaminophen  (PERCOCET) 5-325 MG tablet Take 1-2 tablets by mouth every 6 (six) hours as needed. To be taken after surgery Patient not taking: Reported on 09/28/2023 08/31/23   Sandie Cross, PA-C  pantoprazole  (PROTONIX ) 40 MG tablet Take 1 tablet (40 mg total) by mouth as needed. 03/24/24   Austine Lefort, MD  promethazine  (PHENERGAN ) 25 MG tablet Take 1 tablet (25 mg total) by mouth every 6 (six) hours as needed for nausea or vomiting. 03/24/24   Austine Lefort, MD  rosuvastatin  (CRESTOR ) 10 MG tablet Take 1 tablet (10 mg total) by mouth daily. 03/27/24   Austine Lefort, MD  potassium chloride  (K-DUR) 10 MEQ tablet Take 1 tablet (10 mEq total) by mouth daily. 07/07/17 03/27/20  Tonya Fredrickson, PA-C    Physical Exam: BP (!) 126/101   Pulse 88   Temp 98.3 F (36.8 C) (Oral)   Resp 17   LMP 12/16/2011   SpO2 98%  General:  Alert, oriented, calm, in no acute distress, looks a little tired and dry overall Cardiovascular: RRR, no murmurs or rubs, no peripheral edema  Respiratory: clear to auscultation bilaterally, no wheezes, no crackles  Abdomen: soft, nontender, nondistended, normal bowel tones heard  Skin: dry, no rashes   Musculoskeletal: no joint effusions, normal range of motion  Psychiatric: appropriate affect, normal speech  Neurologic: extraocular muscles intact, clear speech, moving all extremities with intact sensorium         Labs on Admission:  Basic Metabolic Panel: Recent Labs  Lab 04/11/24 0509  NA 119*  K 2.9*  CL 82*  CO2 23  GLUCOSE 129*  BUN 23*  CREATININE 0.93  CALCIUM  8.5*   Liver Function Tests: Recent Labs  Lab 04/11/24 0509  AST 60*  ALT 42  ALKPHOS 74  BILITOT 0.9  PROT 6.8  ALBUMIN 2.9*  Recent Labs  Lab 04/11/24 0509  LIPASE 28   No results for input(s): "AMMONIA" in the last 168 hours. CBC: Recent Labs  Lab 04/11/24 0509  WBC 13.6*  NEUTROABS 11.7*  HGB 15.6*  HCT 43.7  MCV 87.8  PLT 167   Cardiac Enzymes: No results for input(s): "CKTOTAL", "CKMB", "CKMBINDEX", "TROPONINI" in the last 168 hours. BNP (last 3 results) No results for input(s): "BNP" in the last 8760 hours.  ProBNP (last 3 results) No results for input(s): "PROBNP" in the last 8760 hours.  CBG: No results for input(s): "GLUCAP" in the last 168 hours.  Radiological Exams on Admission: DG Chest Port 1 View Result Date: 04/11/2024 CLINICAL DATA:  Chest pain, fever. EXAM: PORTABLE CHEST 1 VIEW COMPARISON:  January 13, 2024 FINDINGS: The heart size and mediastinal contours are within normal limits. Left lung is clear. Interval development of ill-defined right infrahilar opacity concerning for pneumonia. The visualized skeletal structures are unremarkable. IMPRESSION: Interval development of ill-defined right infrahilar opacity which may represent pneumonia, although neoplasm cannot be excluded. Followup PA and lateral chest X-ray is recommended in 3-4 weeks following trial of antibiotic therapy to ensure resolution and exclude underlying malignancy. Electronically Signed   By: Rosalene Colon M.D.   On: 04/11/2024 09:51   Assessment/Plan Adrienne Garcia is a 58 y.o. female with medical  history significant for GERD, depression, anxiety being admitted to the hospital with 5 days of intractable nausea and vomiting, found to have severe hyponatremia.  Severe hyponatremia-I suspect this is a hypovolemic hyponatremia given her dehydration on exam, and recent intractable vomiting and diarrhea.  Given her pneumonia and pulmonary symptoms, this could also be SIADH.  While sodium level is severely low, she is awake alert oriented and asymptomatic.  She has received 2 L of normal saline in the emergency department. -Inpatient admission -Check stat BMP now, and monitor every 8 hours -Hydrate aggressively with normal saline, while also placing her on a fluid restriction (not tolerating anything by mouth anyway) -Will check urine Osm's, serum osm's, and urine sodium  Community-acquired pneumonia-with cough, subjective short of breath.  Not septic and not hypoxic. -Empiric IV azithromycin  and IV Rocephin   Intractable vomiting with diarrhea-potentially due to her community-acquired pneumonia, or perhaps a viral gastroenteritis -Supportive care with IV fluids  GERD-IV Protonix  while severely nauseated  DVT prophylaxis: Lovenox      Code Status: Full Code  Consults called: None  Admission status: The appropriate patient status for this patient is INPATIENT. Inpatient status is judged to be reasonable and necessary in order to provide the required intensity of service to ensure the patient's safety. The patient's presenting symptoms, physical exam findings, and initial radiographic and laboratory data in the context of their chronic comorbidities is felt to place them at high risk for further clinical deterioration. Furthermore, it is not anticipated that the patient will be medically stable for discharge from the hospital within 2 midnights of admission.    I certify that at the point of admission it is my clinical judgment that the patient will require inpatient hospital care spanning beyond  2 midnights from the point of admission due to high intensity of service, high risk for further deterioration and high frequency of surveillance required  Time spent: 59 minutes  Adrienne Garcia Rickey Charm MD [facility] Pager [phone]  If 7PM-7AM, please contact night-coverage [email or web address] Password [facility]  04/11/2024, 11:26 AM

## 2024-04-11 NOTE — ED Notes (Signed)
 Attempted to call report, was transferred and call went to voicemail.

## 2024-04-11 NOTE — ED Provider Notes (Signed)
 [place] EMERGENCY DEPARTMENT AT [facility] Provider Note   CSN: [id number] Arrival date & time: 04/11/24  6644     History {Add pertinent medical, surgical, social history, OB history to HPI:1} Chief Complaint  Patient presents with   Abdominal Pain    Hariklia Chambless is a 58 y.o. female.  HPI 58 yo female with nausea and vomiting and diarrhea began began 5 days ago.  Fever to 102, recent tick bite.  Rash at site of bite.Now chest pain for 3 days. Takes protonix  for reflux. No abdominal surgery.  Abdomenstarted with some pain after vomiting.  No fluids.  States pukes up any gingerale. Last fever Monday am.  Lives with BF no ill. No etoh No tobacco Takes only protonix   Allergic to cipro Decreased urine to 1/day and dark Dr. Jodene [place] Patient states this has happened before but not usually this long.        Home Medications Prior to Admission medications   Medication Sig Start Date End Date Taking? Authorizing Provider  albuterol  (VENTOLIN  HFA) 108 (90 Base) MCG/ACT inhaler Inhale 2 puffs into the lungs every 6 (six) hours as needed for wheezing or shortness of breath. 03/24/24   Austine Lefort, MD  aspirin  EC 81 MG tablet Take 1 tablet (81 mg total) by mouth 2 (two) times daily. To be taken after surgery to prevent blood clots 08/31/23 08/30/24  Sandie Cross, PA-C  docusate sodium  (COLACE) 100 MG capsule Take 1 capsule (100 mg total) by mouth daily as needed. 08/31/23 08/30/24  Sandie Cross, PA-C  HYDROcodone -acetaminophen  (NORCO) 5-325 MG tablet Take 1-2 tablets by mouth 3 (three) times daily as needed. 09/21/23   Sandie Cross, PA-C  meloxicam  (MOBIC ) 15 MG tablet Take 1 tablet (15 mg total) by mouth daily. 03/24/24   Austine Lefort, MD  methocarbamol  (ROBAXIN -750) 750 MG tablet Take 1 tablet (750 mg total) by mouth 2 (two) times daily as needed for muscle spasms. 08/31/23   Sandie Cross, PA-C  methocarbamol  (ROBAXIN -750) 750 MG tablet  Take 1 tablet (750 mg total) by mouth 2 (two) times daily as needed for muscle spasms. Patient not taking: Reported on 09/28/2023 09/21/23   Sandie Cross, PA-C  ondansetron  (ZOFRAN ) 4 MG tablet Take 1 tablet (4 mg total) by mouth every 6 (six) hours. 09/10/23   Clem Currier, DO  ondansetron  (ZOFRAN -ODT) 4 MG disintegrating tablet 4mg  ODT q4 hours prn nausea/vomit 01/13/24   Hershel Los, MD  oxyCODONE -acetaminophen  (PERCOCET) 5-325 MG tablet Take 1-2 tablets by mouth every 6 (six) hours as needed. To be taken after surgery Patient not taking: Reported on 09/28/2023 08/31/23   Sandie Cross, PA-C  pantoprazole  (PROTONIX ) 40 MG tablet Take 1 tablet (40 mg total) by mouth as needed. 03/24/24   Austine Lefort, MD  promethazine  (PHENERGAN ) 25 MG tablet Take 1 tablet (25 mg total) by mouth every 6 (six) hours as needed for nausea or vomiting. 03/24/24   Austine Lefort, MD  rosuvastatin  (CRESTOR ) 10 MG tablet Take 1 tablet (10 mg total) by mouth daily. 03/27/24   Austine Lefort, MD  potassium chloride  (K-DUR) 10 MEQ tablet Take 1 tablet (10 mEq total) by mouth daily. 07/07/17 03/27/20  Tonya Fredrickson, PA-C      Allergies    Ciprofloxacin    Review of Systems   Review of Systems  Physical Exam Updated Vital Signs BP (!) 126/101   Pulse 88   Temp 98.3 F (  36.8 C) (Oral)   Resp 17   LMP 12/16/2011   SpO2 98%  Physical Exam Vitals reviewed.  Constitutional:      General: She is not in acute distress.    Appearance: She is well-developed.  HENT:     Head: Normocephalic and atraumatic.     Mouth/Throat:     Comments: dry Eyes:     Extraocular Movements: Extraocular movements intact.     Pupils: Pupils are equal, round, and reactive to light.  Cardiovascular:     Rate and Rhythm: Normal rate and regular rhythm.     Heart sounds: Normal heart sounds.  Pulmonary:     Effort: Pulmonary effort is normal.     Breath sounds: Normal breath sounds.  Abdominal:     General: Abdomen is  flat. Bowel sounds are decreased.     Palpations: Abdomen is soft.     Tenderness: There is no abdominal tenderness.  Skin:    General: Skin is warm and dry.  Neurological:     General: No focal deficit present.     Mental Status: She is alert.  Psychiatric:        Mood and Affect: Mood normal.        Behavior: Behavior normal.     ED Results / Procedures / Treatments   Labs (all labs ordered are listed, but only abnormal results are displayed) Labs Reviewed  URINALYSIS, ROUTINE W REFLEX MICROSCOPIC - Abnormal; Notable for the following components:      Result Value   Color, Urine AMBER (*)    APPearance CLOUDY (*)    Hgb urine dipstick LARGE (*)    Protein, ur >=300 (*)    Leukocytes,Ua TRACE (*)    All other components within normal limits  COMPREHENSIVE METABOLIC PANEL WITH GFR - Abnormal; Notable for the following components:   Sodium 119 (*)    Potassium 2.9 (*)    Chloride 82 (*)    Glucose, Bld 129 (*)    BUN 23 (*)    Calcium  8.5 (*)    Albumin 2.9 (*)    AST 60 (*)    All other components within normal limits  CBC WITH DIFFERENTIAL/PLATELET - Abnormal; Notable for the following components:   WBC 13.6 (*)    Hemoglobin 15.6 (*)    Neutro Abs 11.7 (*)    Abs Immature Granulocytes 0.11 (*)    All other components within normal limits  CULTURE, BLOOD (ROUTINE X 2)  CULTURE, BLOOD (ROUTINE X 2)  LIPASE, BLOOD  MAGNESIUM  I-STAT CG4 LACTIC ACID, ED  I-STAT CG4 LACTIC ACID, ED    EKG EKG Interpretation Date/Time:  Tuesday Apr 11 2024 06:36:46 EDT Ventricular Rate:  97 PR Interval:  130 QRS Duration:  101 QT Interval:  389 QTC Calculation: 495 R Axis:   88  Text Interpretation: Sinus rhythm LAE, consider biatrial enlargement Minimal ST depression, diffuse leads Borderline prolonged QT interval Confirmed by Auston Blush [phone]) on 04/11/2024 8:24:59 AM  Radiology DG Chest Port 1 View Result Date: 04/11/2024 CLINICAL DATA:  Chest pain, fever. EXAM:  PORTABLE CHEST 1 VIEW COMPARISON:  January 13, 2024 FINDINGS: The heart size and mediastinal contours are within normal limits. Left lung is clear. Interval development of ill-defined right infrahilar opacity concerning for pneumonia. The visualized skeletal structures are unremarkable. IMPRESSION: Interval development of ill-defined right infrahilar opacity which may represent pneumonia, although neoplasm cannot be excluded. Followup PA and lateral chest X-Dalton Molesworth is recommended in 3-4 weeks  following trial of antibiotic therapy to ensure resolution and exclude underlying malignancy. Electronically Signed   By: Rosalene Colon M.D.   On: 04/11/2024 09:51    Procedures Procedures  {Document cardiac monitor, telemetry assessment procedure when appropriate:1}  Medications Ordered in ED Medications  promethazine  (PHENERGAN ) 25 mg in sodium chloride  0.9 % 50 mL IVPB (0 mg Intravenous Stopped 04/11/24 1023)  cefTRIAXone  (ROCEPHIN ) 1 g in sodium chloride  0.9 % 100 mL IVPB (has no administration in time range)  azithromycin  (ZITHROMAX ) 500 mg in sodium chloride  0.9 % 250 mL IVPB (has no administration in time range)  potassium chloride  10 mEq in 100 mL IVPB (has no administration in time range)  sodium chloride  0.9 % bolus 1,000 mL (0 mLs Intravenous Stopped 04/11/24 0856)  ondansetron  (ZOFRAN ) injection 4 mg (4 mg Intravenous Given 04/11/24 0642)  sodium chloride  0.9 % bolus 1,000 mL (1,000 mLs Intravenous New Bag/Given 04/11/24 0857)  HYDROmorphone  (DILAUDID ) injection 0.5 mg (0.5 mg Intravenous Given 04/11/24 0855)  hyoscyamine  (LEVSIN SL) SL tablet 0.25 mg (0.25 mg Sublingual Given 04/11/24 0856)    ED Course/ Medical Decision Making/ A&P Clinical Course as of 04/11/24 1032  Tue Apr 11, 2024  0821 Complete metabolic panel is significant for sodium decreased at 119 and potassium at 219 chloride 82 normal CO2, glucose slightly elevated at 129 BUN 23 creatinine 0.93 She has a mild leukocytosis at 13,600 and  some hemoconcentration with hemoglobin of 15 with normal platelets Lipase is normal Urine appears to be contaminated with squamous epithelial cells 6-10 red blood cells and 11-20 white blood cells  [DR]  0955 X-Janmarie Smoot reviewed interpreted significant for right lower lobe density [DR]    Clinical Course User Index [DR] Auston Blush, MD   {   Click here for ABCD2, HEART and other calculatorsREFRESH Note before signing :1}                              Medical Decision Making Amount and/or Complexity of Data Reviewed Labs: ordered. Radiology: ordered.  Risk Prescription drug management. Decision regarding hospitalization.   58 year old female presents today complaining of nausea, vomiting, diarrhea for 5 days with inability to tolerate liquids.  She reports decreased urine output.  She reports repeat vomiting that does not have any blood or emesis.  She has had multiple stools per day that were loose but denies any blood.  She states she has had similar symptoms in the past and told it was a viral syndrome but they have not lasted as long as this and she has not had this fluid intolerance to keeping anything down.  She reports decreased urine output to once a day.  She has no known sick contacts but has had a recent tick bite without rashes.  She reports some chest discomfort under the left breast which occurred after she began vomiting and has been consistent for several days On evaluation patient is hemodynamically stable with soft and nontender abdomen. Mucous membranes are dry Differential diagnosis includes but is not limited to viral syndrome, pancreatitis, gastritis, small bowel obstruction Patient's abdomen is soft and nontender doubt acute intra-abdominal etiology Patient's labs are significant for severe hyponatremia but patient is awake and alert reports no confusion or seizures. Patient has received 1 L of normal saline prior to my evaluation. 1 nausea vomiting diarrhea-patient  being hydrated here in the ED.  She has received antiemetics and is not actively vomiting at this  time 2 severe hyponatremia patient is receiving normal saline does not meet criteria for hypertonic saline with normal mental status and no seizures at this time.  She will require close monitoring 3 hypokalemia again likely secondary to her vomiting and diarrhea will require some repletion 4 chest pain likely secondary to her vomiting, EKG without acutely ischemic changes.  Pain has been ongoing constantly for 3 days.  Will obtain chest x-Charlize Hathaway  Chest x-Pearlina Friedly with density in right lower lobe which may represent pneumonia or met.  Will give Rocephin  and Zithromax  here in ED and will need follow-up Care discussed with Dr. Lowell Rude who will see for admission {Document critical care time when appropriate:1} {Document review of labs and clinical decision tools ie heart score, Chads2Vasc2 etc:1}  {Document your independent review of radiology images, and any outside records:1} {Document your discussion with family members, caretakers, and with consultants:1} {Document social determinants of health affecting pt's care:1} {Document your decision making why or why not admission, treatments were needed:1} Final Clinical Impression(s) / ED Diagnoses Final diagnoses:  None    Rx / DC Orders ED Discharge Orders     None

## 2024-04-11 NOTE — Plan of Care (Signed)
  Problem: Health Behavior/Discharge Planning: Goal: Ability to manage health-related needs will improve Outcome: Progressing   Problem: Nutrition: Goal: Adequate nutrition will be maintained Outcome: Progressing   Problem: Pain Managment: Goal: General experience of comfort will improve and/or be controlled Outcome: Progressing   Problem: Safety: Goal: Ability to remain free from injury will improve Outcome: Progressing   Problem: Skin Integrity: Goal: Risk for impaired skin integrity will decrease Outcome: Progressing

## 2024-04-11 NOTE — Plan of Care (Signed)
 Patient admitted to stepdown unit, she was having some burning chest pain, which has improved with Maalox and given IV PPI.  Despite multiple attempts, unable to draw enough blood for stat BMP. Discussed with the stepdown nurse, will check i-STAT 8.  While patient is stable with normal mental status, it is crucial that we are able to monitor sodium levels closely.

## 2024-04-12 ENCOUNTER — Encounter (HOSPITAL_COMMUNITY): Payer: Self-pay | Admitting: Internal Medicine

## 2024-04-12 DIAGNOSIS — E871 Hypo-osmolality and hyponatremia: Secondary | ICD-10-CM | POA: Diagnosis not present

## 2024-04-12 LAB — BASIC METABOLIC PANEL WITH GFR
Anion gap: 9 (ref 5–15)
Anion gap: 5 (ref 5–15)
BUN: 11 mg/dL (ref 6–20)
BUN: 8 mg/dL (ref 6–20)
CO2: 23 mmol/L (ref 22–32)
CO2: 28 mmol/L (ref 22–32)
Calcium: 8 mg/dL — ABNORMAL LOW (ref 8.9–10.3)
Calcium: 8.1 mg/dL — ABNORMAL LOW (ref 8.9–10.3)
Chloride: 96 mmol/L — ABNORMAL LOW (ref 98–111)
Chloride: 96 mmol/L — ABNORMAL LOW (ref 98–111)
Creatinine, Ser: 0.57 mg/dL (ref 0.44–1.00)
Creatinine, Ser: 0.67 mg/dL (ref 0.44–1.00)
GFR, Estimated: >60 mL/min (ref >60)
GFR, Estimated: >60 mL/min (ref >60)
Glucose, Bld: 91 mg/dL (ref 70–99)
Glucose, Bld: 85 mg/dL (ref 70–99)
Potassium: 3.4 mmol/L — ABNORMAL LOW (ref 3.5–5.1)
Potassium: 3.6 mmol/L (ref 3.5–5.1)
Sodium: 128 mmol/L — ABNORMAL LOW (ref 135–145)
Sodium: 129 mmol/L — ABNORMAL LOW (ref 135–145)

## 2024-04-12 LAB — PROCALCITONIN: Procalcitonin: 0.44 ng/mL

## 2024-04-12 LAB — HIV ANTIBODY (ROUTINE TESTING W REFLEX): HIV Screen 4th Generation wRfx: NONREACTIVE

## 2024-04-12 MED ORDER — SODIUM CHLORIDE 0.9 % IV SOLN: INTRAVENOUS | Status: AC

## 2024-04-12 MED ORDER — SODIUM CHLORIDE 0.9 % IV SOLN
100.0000 mg | Freq: Two times a day (BID) | INTRAVENOUS | Status: DC
  Administered 2024-04-12 – 2024-04-13 (×3): 100 mg via INTRAVENOUS
  Filled 2024-04-12 (×3): qty 100

## 2024-04-12 MED ORDER — POTASSIUM CHLORIDE CRYS ER 20 MEQ PO TBCR
40.0000 meq | EXTENDED_RELEASE_TABLET | Freq: Once | ORAL | Status: AC
Start: 2024-04-12 — End: 2024-04-12
  Administered 2024-04-12: 40 meq via ORAL
  Filled 2024-04-12: qty 2

## 2024-04-12 MED ORDER — GUAIFENESIN ER 600 MG PO TB12
600.0000 mg | ORAL_TABLET | Freq: Two times a day (BID) | ORAL | Status: DC
  Administered 2024-04-12 – 2024-04-13 (×3): 600 mg via ORAL
  Filled 2024-04-12 (×3): qty 1

## 2024-04-12 NOTE — Plan of Care (Signed)
  Problem: Education: Goal: Knowledge of General Education information will improve Description: Including pain rating scale, medication(s)/side effects and non-pharmacologic comfort measures Outcome: Progressing   Problem: Health Behavior/Discharge Planning: Goal: Ability to manage health-related needs will improve Outcome: Progressing   Problem: Clinical Measurements: Goal: Will remain free from infection Outcome: Progressing Goal: Diagnostic test results will improve Outcome: Progressing   Problem: Coping: Goal: Level of anxiety will decrease Outcome: Progressing   Problem: Pain Managment: Goal: General experience of comfort will improve and/or be controlled Outcome: Progressing   Problem: Nutrition: Goal: Adequate nutrition will be maintained Outcome: Not Progressing

## 2024-04-12 NOTE — Progress Notes (Signed)
   04/12/24 1402  TOC Brief Assessment  Insurance and Status Reviewed  Patient has primary care physician Yes  Home environment has been reviewed home w/ family  Prior level of function: independent  Prior/Current Home Services No current home services  Social Drivers of Health Review SDOH reviewed no interventions necessary  Readmission risk has been reviewed Yes  Transition of care needs transition of care needs identified, TOC will continue to follow

## 2024-04-12 NOTE — Plan of Care (Signed)
  Problem: Education: Goal: Knowledge of General Education information will improve Description: Including pain rating scale, medication(s)/side effects and non-pharmacologic comfort measures Outcome: Progressing   Problem: Health Behavior/Discharge Planning: Goal: Ability to manage health-related needs will improve Outcome: Progressing   Problem: Clinical Measurements: Goal: Ability to maintain clinical measurements within normal limits will improve Outcome: Progressing Goal: Will remain free from infection Outcome: Progressing Goal: Diagnostic test results will improve Outcome: Progressing Goal: Respiratory complications will improve Outcome: Progressing Goal: Cardiovascular complication will be avoided Outcome: Progressing   Problem: Coping: Goal: Level of anxiety will decrease Outcome: Progressing   Problem: Elimination: Goal: Will not experience complications related to bowel motility Outcome: Progressing Goal: Will not experience complications related to urinary retention Outcome: Progressing   Problem: Pain Managment: Goal: General experience of comfort will improve and/or be controlled Outcome: Progressing   Problem: Safety: Goal: Ability to remain free from injury will improve Outcome: Progressing   Problem: Skin Integrity: Goal: Risk for impaired skin integrity will decrease Outcome: Progressing

## 2024-04-12 NOTE — Progress Notes (Signed)
 PROGRESS NOTE  Adrienne Garcia  DOB: [birth year]  PCP: Austine Lefort, MD JYN:[id number]  DOA: 04/11/2024  LOS: 1 day  Hospital Day: 2  Brief narrative: Adrienne Garcia is a 58 y.o. female with PMH significant for obesity, HLD, GERD, anxiety, depression, PTSD, migraine 5/13, patient presented to ED with complaint of intractable nausea, vomiting, diarrhea for 5 day.  She was not able to keep anything down.  She also reported productive cough and a fever of 102 at home prior to the onset of symptoms.    In the ED, patient was afebrile, slightly tachycardic, blood pressure stable. Initial labs with sodium level significantly low at 119, potassium low at 2.9, WC count 13.6, lactic acid level normal Cloudy urine with trace leukocytes, more than 300 protein, large hemoglobin Chest x-ray compared to 3 months ago showed interval development of ill-defined right infrahilar opacity which may represent pneumonia, although neoplasm cannot be excluded. Blood culture sent Patient was started on IV fluid, IV antibiotics Admitted to [facility]  Subjective: Patient was seen and examined this morning.  Pleasant middle-aged Caucasian female.  Lying down in bed.  Still nauseous.  No vomiting or diarrhea in last 24 hours. Chart reviewed In the last 24 hours Tmax 99.5, hemodynamically stable Most recent labs from this morning with sodium 128, potassium 3.4  Assessment and plan: Severe hyponatremia Presented with sodium level significantly low at 119, serum osmolality low at 263, urine osmolality elevated to 473 and urine sodium low at 14 In the setting of intractable nausea, vomiting, diarrhea and low intake for 5 days, I suspect hypovolemic hyponatremia.  There may also be some component of SIADH because of intractable nausea but it seems this is largely hypovolemic in nature. Was started on IV hydration. Sodium level improved, 128 on the last check. Continue IV hydration with NS at 125 mL/h. Continue to monitor  sodium level Recent Labs  Lab 04/11/24 0509 04/11/24 1417 04/11/24 2103 04/12/24 0419 04/12/24 1219  NA 119* 124* 130* 128* 129*   Acute gastroenteritis Intractable nausea, vomiting and diarrhea for 5 days Likely viral in nature Symptoms present improving. Tolerated clear liquid diet.  Wants to try soft diet. Continue as needed antiemetic  Right infrahilar opacity Suspect aspiration pneumonia Could have aspirated during vomiting reported fever of 102 at home Currently on empiric antibiotic coverage with IV Rocephin  and azithromycin  (QTc 495 ms on EKG on admission).  I will switch from azithromycin  to doxycycline  Per radiology recommendation, follow-up PA and lateral chest X-ray is recommended in 3-4 weeks following trial of antibiotic therapy to ensure resolution and exclude underlying malignancy.  Proteinuria Urinalysis on admission with urine albumin level more than 300.  Unclear reason.  Could be due to sickness.  I do not see any metabolic disease that could lead to CKD and proteinuria. Continue to monitor. Repeat UA as an outpatient.  GERD PPI  HLD Crestor   Mobility: Encourage ambulation.  Independent at baseline  Goals of care   Code Status: Full Code     DVT prophylaxis:  enoxaparin  (LOVENOX ) injection 40 mg Start: 04/11/24 1200   Antimicrobials: IV Rocephin , doxycycline  Fluid: NS at 125 mL/h Consultants: None Family Communication: None at bedside  Status: Inpatient Level of care:  Stepdown   Patient is from: Home Needs to continue in-hospital care: Gradually improving symptoms.  Needs further monitoring and IV hydration for next 24 hours at least Anticipated d/c to: Hopefully home in 1 to 2 days      Diet:  Diet  Order             DIET SOFT Room service appropriate? Yes; Fluid consistency: Thin  Diet effective now                   Scheduled Meds:  Chlorhexidine  Gluconate Cloth  6 each Topical Daily   enoxaparin  (LOVENOX ) injection  40  mg Subcutaneous Daily   pantoprazole  (PROTONIX ) IV  40 mg Intravenous Q24H    PRN meds: acetaminophen  **OR** acetaminophen , albuterol , alum & mag hydroxide-simeth, morphine  injection, ondansetron  **OR** ondansetron  (ZOFRAN ) IV, mouth rinse, promethazine  (PHENERGAN ) injection (IM or IVPB), traZODone   Infusions:   sodium chloride  125 mL/hr at 04/12/24 1257   cefTRIAXone  (ROCEPHIN )  IV Stopped (04/12/24 1146)   doxycycline  (VIBRAMYCIN ) IV Stopped (04/12/24 1116)   promethazine  (PHENERGAN ) injection (IM or IVPB) Stopped (04/12/24 1055)    Antimicrobials: Anti-infectives (From admission, onward)    Start     Dose/Rate Route Frequency Ordered Stop   04/12/24 1200  azithromycin  (ZITHROMAX ) 500 mg in sodium chloride  0.9 % 250 mL IVPB  Status:  Discontinued        500 mg 250 mL/hr over 60 Minutes Intravenous Every 24 hours 04/11/24 1125 04/12/24 0821   04/12/24 1000  cefTRIAXone  (ROCEPHIN ) 1 g in sodium chloride  0.9 % 100 mL IVPB        1 g 200 mL/hr over 30 Minutes Intravenous Every 24 hours 04/11/24 1125     04/12/24 1000  doxycycline  (VIBRAMYCIN ) 100 mg in sodium chloride  0.9 % 250 mL IVPB        100 mg 125 mL/hr over 120 Minutes Intravenous Every 12 hours 04/12/24 0821     04/11/24 1000  cefTRIAXone  (ROCEPHIN ) 1 g in sodium chloride  0.9 % 100 mL IVPB        1 g 200 mL/hr over 30 Minutes Intravenous  Once 04/11/24 0956 04/11/24 1227   04/11/24 1000  azithromycin  (ZITHROMAX ) 500 mg in sodium chloride  0.9 % 250 mL IVPB        500 mg 250 mL/hr over 60 Minutes Intravenous  Once 04/11/24 0956 04/11/24 1659       Objective: Vitals:   04/12/24 0800 04/12/24 1200  BP:  (!) 118/55  Pulse: 67 70  Resp: (!) 21 20  Temp: 98.9 F (37.2 C) 98.5 F (36.9 C)  SpO2: 98% 93%    Intake/Output Summary (Last 24 hours) at 04/12/2024 1339 Last data filed at 04/12/2024 0800 Gross per 24 hour  Intake 3048.99 ml  Output 1150 ml  Net 1898.99 ml   Filed Weights   04/11/24 1233  Weight: 94.6 kg    Weight change:  Body mass index is 33.66 kg/m.   Physical Exam: General exam: Pleasant, middle-aged Caucasian female.  Not in pain Skin: No rashes, lesions or ulcers. HEENT: Atraumatic, normocephalic, no obvious bleeding Lungs: Clear to auscultation bilaterally,  CVS: S1, S2, no murmur,   GI/Abd: Soft, nontender, nondistended, bowel sound present,   CNS: Alert, awake monitor x 3 Psychiatry: Sad affect Extremities: No pedal edema, no calf tenderness,   Data Review: I have personally reviewed the laboratory data and studies available.  F/u labs ordered Unresulted Labs (From admission, onward)     Start     Ordered   04/13/24 0500  Basic metabolic panel with GFR  Tomorrow morning,   R       Question:  Specimen collection method  Answer:  Lab=Lab collect   04/12/24 0820   04/13/24 0500  CBC with Differential/Platelet  Tomorrow morning,   R       Question:  Specimen collection method  Answer:  Lab=Lab collect   04/12/24 0820   04/11/24 0957  Blood culture (routine x 2)  BLOOD CULTURE X 2,   R      04/11/24 0956           Signed, Hoyt Macleod, MD [facility] 04/12/2024

## 2024-04-13 ENCOUNTER — Other Ambulatory Visit (HOSPITAL_COMMUNITY): Payer: Self-pay

## 2024-04-13 DIAGNOSIS — E871 Hypo-osmolality and hyponatremia: Secondary | ICD-10-CM | POA: Diagnosis not present

## 2024-04-13 LAB — CBC WITH DIFFERENTIAL/PLATELET
Abs Immature Granulocytes: 0.09 10*3/uL — ABNORMAL HIGH (ref 0.00–0.07)
Basophils Absolute: 0 10*3/uL (ref 0.0–0.1)
Basophils Relative: 0 %
Eosinophils Absolute: 0.1 10*3/uL (ref 0.0–0.5)
Eosinophils Relative: 1 %
HCT: 33.7 % — ABNORMAL LOW (ref 36.0–46.0)
Hemoglobin: 11.3 g/dL — ABNORMAL LOW (ref 12.0–15.0)
Immature Granulocytes: 1 %
Lymphocytes Relative: 16 %
Lymphs Abs: 1.3 10*3/uL (ref 0.7–4.0)
MCH: 32 pg (ref 26.0–34.0)
MCHC: 33.5 g/dL (ref 30.0–36.0)
MCV: 95.5 fL (ref 80.0–100.0)
Monocytes Absolute: 1 10*3/uL (ref 0.1–1.0)
Monocytes Relative: 12 %
Neutro Abs: 5.9 10*3/uL (ref 1.7–7.7)
Neutrophils Relative %: 70 %
Platelets: 152 10*3/uL (ref 150–400)
RBC: 3.53 MIL/uL — ABNORMAL LOW (ref 3.87–5.11)
RDW: 13.8 % (ref 11.5–15.5)
WBC: 8.4 10*3/uL (ref 4.0–10.5)
nRBC: 0 % (ref 0.0–0.2)

## 2024-04-13 LAB — BASIC METABOLIC PANEL WITH GFR
Anion gap: 8 (ref 5–15)
BUN: 5 mg/dL — ABNORMAL LOW (ref 6–20)
CO2: 25 mmol/L (ref 22–32)
Calcium: 8 mg/dL — ABNORMAL LOW (ref 8.9–10.3)
Chloride: 98 mmol/L (ref 98–111)
Creatinine, Ser: 0.31 mg/dL — ABNORMAL LOW (ref 0.44–1.00)
GFR, Estimated: >60 mL/min (ref >60)
Glucose, Bld: 85 mg/dL (ref 70–99)
Potassium: 3.3 mmol/L — ABNORMAL LOW (ref 3.5–5.1)
Sodium: 131 mmol/L — ABNORMAL LOW (ref 135–145)

## 2024-04-13 MED ORDER — POTASSIUM CHLORIDE CRYS ER 20 MEQ PO TBCR
40.0000 meq | EXTENDED_RELEASE_TABLET | Freq: Once | ORAL | Status: AC
  Administered 2024-04-13: 40 meq via ORAL
  Filled 2024-04-13: qty 2

## 2024-04-13 MED ORDER — GUAIFENESIN ER 600 MG PO TB12
600.0000 mg | ORAL_TABLET | Freq: Two times a day (BID) | ORAL | 0 refills | Status: AC
  Filled 2024-04-13: qty 14, 7d supply, fill #0

## 2024-04-13 MED ORDER — AMOXICILLIN-POT CLAVULANATE 875-125 MG PO TABS
1.0000 | ORAL_TABLET | Freq: Two times a day (BID) | ORAL | 0 refills | Status: AC
  Filled 2024-04-13: qty 10, 5d supply, fill #0

## 2024-04-13 MED ORDER — DOXYCYCLINE HYCLATE 100 MG PO TABS
100.0000 mg | ORAL_TABLET | Freq: Two times a day (BID) | ORAL | 0 refills | Status: AC
  Filled 2024-04-13: qty 6, 3d supply, fill #0

## 2024-04-13 NOTE — Progress Notes (Signed)
 Discharge medications delivered to patient at bedside [facility]

## 2024-04-13 NOTE — Plan of Care (Signed)

## 2024-04-13 NOTE — Discharge Summary (Signed)
 Physician Discharge Summary  Adrienne Garcia ZOX:[id number] DOB: 1966/05/01 DOA: 04/11/2024  PCP: Austine Lefort, MD  Admit date: 04/11/2024 Discharge date: 04/13/2024  Admitted From: Home Discharge disposition: Home  Recommendations at discharge:  Complete the course of antibiotics with 5 more days of oral Augmentin and 3 more days of oral doxycycline  Per radiology recommendation, follow-up PA and lateral chest X-ray is recommended in 3-4 weeks following trial of antibiotic therapy to ensure resolution and exclude underlying malignancy. Follow-up with PCP in 1 week for BMP, repeat urinalysis Outpatient referral for GI evaluation given   Brief narrative: Adrienne Garcia is a 58 y.o. female with PMH significant for obesity, HLD, GERD, anxiety, depression, PTSD, migraine 5/13, patient presented to ED with complaint of intractable nausea, vomiting, diarrhea for 5 day.  She was not able to keep anything down.  She also reported productive cough and a fever of 102 at home prior to the onset of symptoms.    In the ED, patient was afebrile, slightly tachycardic, blood pressure stable. Initial labs with sodium level significantly low at 119, potassium low at 2.9, WC count 13.6, lactic acid level normal Cloudy urine with trace leukocytes, more than 300 protein, large hemoglobin Chest x-ray compared to 3 months ago showed interval development of ill-defined right infrahilar opacity which may represent pneumonia, although neoplasm cannot be excluded. Blood culture sent Patient was started on IV fluid, IV antibiotics Admitted to [facility]  Subjective: Patient was seen and examined this morning.   Feels much better.  Able to tolerate diet.  No nausea vomiting or diarrhea.   Complains of heartburn which she has for quite some time and is on Protonix .    Assessment and plan: Severe hyponatremia Presented with sodium level significantly low at 119, serum osmolality low at 263, urine osmolality elevated  to 473 and urine sodium low at 14 In the setting of intractable nausea, vomiting, diarrhea and low intake for 5 days, I suspect hypovolemic hyponatremia.  There may also be some component of SIADH because of intractable nausea but it seems this is largely hypovolemic in nature. Was started on IV hydration. Sodium level gradually improved, 131 today.  Follow-up with PCP in 1 week for repeat BMP. Recent Labs  Lab 04/11/24 0509 04/11/24 1417 04/11/24 2103 04/12/24 0419 04/12/24 1219 04/13/24 0313  NA 119* 124* 130* 128* 129* 131*   Acute gastroenteritis Presented with intractable nausea, vomiting and diarrhea for 5 days Likely viral in nature Symptoms improving. Tolerating soft diet  Right infrahilar opacity Suspect aspiration pneumonia Could have aspirated during vomiting reported fever of 102 at home Currently on empiric antibiotic coverage with IV Rocephin  and doxycycline   Will discharge with 5 more days of oral Augmentin and 3 days of doxycycline .  Her stop Per radiology recommendation, follow-up PA and lateral chest X-ray is recommended in 3-4 weeks following trial of antibiotic therapy to ensure resolution and exclude underlying malignancy.  Proteinuria Urinalysis on admission with urine albumin level more than 300.  Unclear reason.  Could be due to sickness.  I do not see any metabolic disease that could lead to CKD and proteinuria. Repeat UA as an outpatient.  GERD Complains of intermittent severe heartburn without obvious bleeding or black stool. On Protonix  at home for long time.  Has not had endoscopic evaluation.  Hemoglobin stable.  No need of endoscopy evaluation in the hospital. Will benefit from outpatient evaluation by GI.  Outpatient referral given her stop  HLD Crestor   Independently able to ambulate.  Goals of care   Code Status: Full Code   Diet:  Diet Order             Diet regular Room service appropriate? Yes; Fluid consistency: Thin  Diet  effective now           Diet general                   Nutritional status:  Body mass index is 33.66 kg/m.       Wounds:  -    Discharge Exam:   Vitals:   04/13/24 0300 04/13/24 0611 04/13/24 0703 04/13/24 0800  BP:  131/80    Pulse:    62  Resp:  15  16  Temp: 98.3 F (36.8 C)  98.1 F (36.7 C)   TempSrc: Oral  Oral   SpO2:    93%  Weight:      Height:        Body mass index is 33.66 kg/m.  General exam: Pleasant, middle-aged Caucasian female.  Not in pain Skin: No rashes, lesions or ulcers. HEENT: Atraumatic, normocephalic, no obvious bleeding Lungs: Clear to auscultation bilaterally,  CVS: S1, S2, no murmur,   GI/Abd: Soft, mild chronic epigastric tenderness, nondistended, bowel sound present,   CNS: Alert, awake monitor x 3 Psychiatry: Mood appropriate Extremities: No pedal edema, no calf tenderness,  Follow ups:    Follow-up Information     Austine Lefort, MD Follow up.   Specialty: Family Medicine Contact information: [address] [place] [address] [phone]         [facility] Follow up.   Specialty: Gastroenterology Contact information: [address] [place]  [address] [phone]                Discharge Instructions:   Discharge Instructions     Call MD for:  difficulty breathing, headache or visual disturbances   Complete by: As directed    Call MD for:  extreme fatigue   Complete by: As directed    Call MD for:  hives   Complete by: As directed    Call MD for:  persistant dizziness or light-headedness   Complete by: As directed    Call MD for:  persistant nausea and vomiting   Complete by: As directed    Call MD for:  severe uncontrolled pain   Complete by: As directed    Call MD for:  temperature >100.4   Complete by: As directed    Diet general   Complete by: As directed    Discharge instructions   Complete by: As directed     Recommendations at discharge:   Complete the course of antibiotics with 5 more days of oral Augmentin and 3 more days of oral doxycycline   Per radiology recommendation, follow-up PA and lateral chest X-ray is recommended in 3-4 weeks following trial of antibiotic therapy to ensure resolution and exclude underlying malignancy.  Follow-up with PCP in 1 week for BMP, repeat urinalysis  Outpatient referral for GI evaluation given   General discharge instructions: Follow with Primary MD Austine Lefort, MD in 7 days  Please request your PCP  to go over your hospital tests, procedures, radiology results at the follow up. Please get your medicines reviewed and adjusted.  Your PCP may decide to repeat certain labs or tests as needed. Do not drive, operate heavy machinery, perform activities at heights, swimming or participation in water  activities or  provide baby sitting services if your were admitted for syncope or siezures until you have seen by Primary MD or a Neurologist and advised to do so again. [place]  Controlled Substance Reporting System database was reviewed. Do not drive, operate heavy machinery, perform activities at heights, swim, participate in water  activities or provide baby-sitting services while on medications for pain, sleep and mood until your outpatient physician has reevaluated you and advised to do so again.  You are strongly recommended to comply with the dose, frequency and duration of prescribed medications. Activity: As tolerated with Full fall precautions use walker/cane & assistance as needed Avoid using any recreational substances like cigarette, tobacco, alcohol, or non-prescribed drug. If you experience worsening of your admission symptoms, develop shortness of breath, life threatening emergency, suicidal or homicidal thoughts you must seek medical attention immediately by calling 911 or calling your MD immediately  if symptoms less severe. You must read complete  instructions/literature along with all the possible adverse reactions/side effects for all the medicines you take and that have been prescribed to you. Take any new medicine only after you have completely understood and accepted all the possible adverse reactions/side effects.  Wear Seat belts while driving. You were cared for by a hospitalist during your hospital stay. If you have any questions about your discharge medications or the care you received while you were in the hospital after you are discharged, you can call the unit and ask to speak with the hospitalist or the covering physician. Once you are discharged, your primary care physician will handle any further medical issues. Please note that NO REFILLS for any discharge medications will be authorized once you are discharged, as it is imperative that you return to your primary care physician (or establish a relationship with a primary care physician if you do not have one).   Increase activity slowly   Complete by: As directed        Discharge Medications:   Allergies as of 04/13/2024       Reactions   Ciprofloxacin Hives        Medication List     TAKE these medications    amoxicillin -clavulanate 875-125 MG tablet Commonly known as: AUGMENTIN Take 1 tablet by mouth 2 (two) times daily for 5 days.   aspirin  EC 81 MG tablet Take 1 tablet (81 mg total) by mouth 2 (two) times daily. To be taken after surgery to prevent blood clots   docusate sodium  100 MG capsule Commonly known as: Colace Take 1 capsule (100 mg total) by mouth daily as needed. What changed: reasons to take this   doxycycline  100 MG tablet Commonly known as: VIBRA -TABS Take 1 tablet (100 mg total) by mouth 2 (two) times daily for 3 days.   guaiFENesin  600 MG 12 hr tablet Commonly known as: MUCINEX  Take 1 tablet (600 mg total) by mouth 2 (two) times daily for 7 days.   meloxicam  15 MG tablet Commonly known as: MOBIC  Take 1 tablet (15 mg total) by  mouth daily.   pantoprazole  40 MG tablet Commonly known as: PROTONIX  Take 1 tablet (40 mg total) by mouth as needed. What changed:  when to take this reasons to take this   promethazine  25 MG tablet Commonly known as: PHENERGAN  Take 1 tablet (25 mg total) by mouth every 6 (six) hours as needed for nausea or vomiting.   rosuvastatin  10 MG tablet Commonly known as: Crestor  Take 1 tablet (10 mg total) by mouth daily.   Ventolin   HFA 108 (90 Base) MCG/ACT inhaler Generic drug: albuterol  Inhale 2 puffs into the lungs every 6 (six) hours as needed for wheezing or shortness of breath.         The results of significant diagnostics from this hospitalization (including imaging, microbiology, ancillary and laboratory) are listed below for reference.    Procedures and Diagnostic Studies:   DG Chest Port 1 View Result Date: 04/11/2024 CLINICAL DATA:  Chest pain, fever. EXAM: PORTABLE CHEST 1 VIEW COMPARISON:  January 13, 2024 FINDINGS: The heart size and mediastinal contours are within normal limits. Left lung is clear. Interval development of ill-defined right infrahilar opacity concerning for pneumonia. The visualized skeletal structures are unremarkable. IMPRESSION: Interval development of ill-defined right infrahilar opacity which may represent pneumonia, although neoplasm cannot be excluded. Followup PA and lateral chest X-ray is recommended in 3-4 weeks following trial of antibiotic therapy to ensure resolution and exclude underlying malignancy. Electronically Signed   By: Rosalene Colon M.D.   On: 04/11/2024 09:51     Labs:   Basic Metabolic Panel: Recent Labs  Lab 04/11/24 1100 04/11/24 1417 04/11/24 2103 04/12/24 0419 04/12/24 1219 04/13/24 0313  NA  --  124* 130* 128* 129* 131*  K  --  3.3* 3.6 3.4* 3.6 3.3*  CL  --  89* 96* 96* 96* 98  CO2  --  23 21* 23 28 25   GLUCOSE  --  98 82 91 85 85  BUN  --  16 14 11 8  5*  CREATININE  --  0.72 0.67 0.57 0.67 0.31*  CALCIUM    --  7.9* 8.6* 8.0* 8.1* 8.0*  MG 1.8  --   --   --   --   --    GFR Estimated Creatinine Clearance: 89.9 mL/min (A) (by C-G formula based on SCr of 0.31 mg/dL (L)). Liver Function Tests: Recent Labs  Lab 04/11/24 0509  AST 60*  ALT 42  ALKPHOS 74  BILITOT 0.9  PROT 6.8  ALBUMIN 2.9*   Recent Labs  Lab 04/11/24 0509  LIPASE 28   No results for input(s): "AMMONIA" in the last 168 hours. Coagulation profile No results for input(s): "INR", "PROTIME" in the last 168 hours.  CBC: Recent Labs  Lab 04/11/24 0509 04/13/24 0648  WBC 13.6* 8.4  NEUTROABS 11.7* 5.9  HGB 15.6* 11.3*  HCT 43.7 33.7*  MCV 87.8 95.5  PLT 167 152   Cardiac Enzymes: No results for input(s): "CKTOTAL", "CKMB", "CKMBINDEX", "TROPONINI" in the last 168 hours. BNP: Invalid input(s): "POCBNP" CBG: No results for input(s): "GLUCAP" in the last 168 hours. D-Dimer No results for input(s): "DDIMER" in the last 72 hours. Hgb A1c No results for input(s): "HGBA1C" in the last 72 hours. Lipid Profile No results for input(s): "CHOL", "HDL", "LDLCALC", "TRIG", "CHOLHDL", "LDLDIRECT" in the last 72 hours. Thyroid  function studies No results for input(s): "TSH", "T4TOTAL", "T3FREE", "THYROIDAB" in the last 72 hours.  Invalid input(s): "FREET3" Anemia work up No results for input(s): "VITAMINB12", "FOLATE", "FERRITIN", "TIBC", "IRON", "RETICCTPCT" in the last 72 hours. Microbiology Recent Results (from the past 240 hours)  Blood culture (routine x 2)     Status: None (Preliminary result)   Collection Time: 04/11/24 11:00 AM   Specimen: BLOOD LEFT HAND  Result Value Ref Range Status   Specimen Description   Final    BLOOD LEFT HAND Performed at [facility], [address], [place], [place] [address]    Special Requests   Final  BOTTLES DRAWN AEROBIC AND ANAEROBIC Blood Culture results may not be optimal due to an inadequate volume of blood received in culture bottles Performed at [facility], [address], [place], [place] [address]    Culture   Final    NO GROWTH 2 DAYS Performed at [facility], [address], [place], [place] [address]    Report Status PENDING  Incomplete  MRSA Next Gen by PCR, Nasal     Status: None   Collection Time: 04/11/24 12:27 PM   Specimen: Nasal Mucosa; Nasal Swab  Result Value Ref Range Status   MRSA by PCR Next Gen NOT DETECTED NOT DETECTED Final    Comment: (NOTE) The GeneXpert MRSA Assay (FDA approved for NASAL specimens only), is one component of a comprehensive MRSA colonization surveillance program. It is not intended to diagnose MRSA infection nor to guide or monitor treatment for MRSA infections. Test performance is not FDA approved in patients less than 29 years old. Performed at [facility], [address] [place], [place] [address]   Blood culture (routine x 2)     Status: None (Preliminary result)   Collection Time: 04/11/24  9:03 PM   Specimen: BLOOD  Result Value Ref Range Status   Specimen Description   Final    BLOOD BLOOD RIGHT HAND Performed at [facility], [address] [place], [place] [address]    Special Requests   Final    BOTTLES DRAWN AEROBIC AND ANAEROBIC Blood Culture results may not be optimal due to an inadequate volume of blood received in culture bottles Performed at [facility], [address] [place], [place] [address]    Culture   Final    NO GROWTH 1 DAY Performed at [facility], [address], [place], [place] [address]    Report Status PENDING  Incomplete    Time coordinating discharge: 55 minutes  Signed: Kostantinos Tallman  [facility] 04/13/2024, 11:31 AM

## 2024-04-14 ENCOUNTER — Telehealth: Payer: Self-pay | Admitting: *Deleted

## 2024-04-14 ENCOUNTER — Encounter: Payer: Self-pay | Admitting: Physician Assistant

## 2024-04-14 NOTE — Transitions of Care (Post Inpatient/ED Visit) (Signed)
 04/14/2024  Name: Adrienne Garcia MRN: [id number] DOB: 05-01-1966  Today's TOC FU Call Status: Today's TOC FU Call Status:: Successful TOC FU Call Completed TOC FU Call Complete Date: 04/14/24 Patient's Name and Date of Birth confirmed.  Transition Care Management Follow-up Telephone Call Date of Discharge: 04/13/24 Discharge Facility: [facility]) Type of Discharge: Inpatient Admission Primary Inpatient Discharge Diagnosis:: N/V/D, cough / fever; Severe hyponatremia; gastroenteritis; possible pneumonia How have you been since you were released from the hospital?: Better ("I am slowly getting to feel better; eating a bland soft diet after all the throwing up I had last week; taking the antibiotics like they told me to.  There are specks of blood in when I cough, but they told me to expect that.  I am independent") Any questions or concerns?: No  Items Reviewed: Did you receive and understand the discharge instructions provided?: Yes (thoroughly reviewed with patient who verbalizes good understanding of same) Medications obtained,verified, and reconciled?: Yes (Medications Reviewed) (Full medication reconciliation/ review completed; no concerns or discrepancies identified; confirmed patient obtained/ is taking all newly Rx'd medications as instructed; self-manages medications and denies questions/ concerns around medications today) Any new allergies since your discharge?: No Dietary orders reviewed?: Yes Type of Diet Ordered:: "Conservative and bland for now; I am just now starting back on eating after a week of throwing up" Do you have support at home?: Yes People in Home [RPT]: significant other Name of Support/Comfort Primary Source: Reports independent in self-care activities; resides with supportive boyfriend- assists as/ if needed/ indicated  Medications Reviewed Today: Medications Reviewed Today     Reviewed by Kolbe Delmonaco M, RN (Registered Nurse) on 04/14/24 at 1136  Med  List Status: <None>   Medication Order Taking? Sig Documenting Provider Last Dose Status Informant  albuterol  (VENTOLIN  HFA) 108 (90 Base) MCG/ACT inhaler [id number] Yes Inhale 2 puffs into the lungs every 6 (six) hours as needed for wheezing or shortness of breath. Austine Lefort, MD Taking Active Self, Pharmacy Records, Multiple Informants  amoxicillin -clavulanate (AUGMENTIN) 875-125 MG tablet [id number] Yes Take 1 tablet by mouth 2 (two) times daily for 5 days. Hoyt Macleod, MD Taking Active   aspirin  EC 81 MG tablet [id number] No Take 1 tablet (81 mg total) by mouth 2 (two) times daily. To be taken after surgery to prevent blood clots  Patient not taking: Reported on 04/14/2024   Sandie Cross, PA-C Not Taking Active Self, Pharmacy Records, Multiple Informants           Med Note Andres Bangs   Fri Apr 14, 2024 11:20 AM) 04/14/24: Reports during TOC call, is no longer taking states "have never taken"  docusate sodium  (COLACE) 100 MG capsule [id number] No Take 1 capsule (100 mg total) by mouth daily as needed.  Patient not taking: Reported on 04/14/2024   Sandie Cross, PA-C Not Taking Active Self, Pharmacy Records, Multiple Informants           Med Note Andres Bangs   Fri Apr 14, 2024 11:24 AM) 04/14/24: Reports during TOC call, no longer taking/ needing   doxycycline  (VIBRA -TABS) 100 MG tablet [id number] Yes Take 1 tablet (100 mg total) by mouth 2 (two) times daily for 3 days. Hoyt Macleod, MD Taking Active   guaiFENesin  (MUCINEX ) 600 MG 12 hr tablet [id number] Yes Take 1 tablet (600 mg total) by mouth 2 (two) times daily for 7 days. Hoyt Macleod, MD Taking Active   meloxicam  (MOBIC ) 15 MG tablet [id number]  No Take 1 tablet (15 mg total) by mouth daily.  Patient not taking: Reported on 04/14/2024   Austine Lefort, MD Not Taking Active Self, Pharmacy Records, Multiple Informants           Med Note Andres Bangs   Fri Apr 14, 2024 11:22 AM) 04/14/24: Reports during [facility] call,  is no longer taking since "had knee replacement"   pantoprazole  (PROTONIX ) 40 MG tablet [id number] Yes Take 1 tablet (40 mg total) by mouth as needed.  Patient taking differently: Take 40 mg by mouth daily as needed (heartburn).   Austine Lefort, MD Taking Active Self, Pharmacy Records, Multiple Informants    Discontinued 03/27/20 1516   promethazine  (PHENERGAN ) 25 MG tablet [id number] Yes Take 1 tablet (25 mg total) by mouth every 6 (six) hours as needed for nausea or vomiting. Austine Lefort, MD Taking Active Self, Pharmacy Records, Multiple Informants           Med Note (Chan Sheahan M   Fri Apr 14, 2024 11:23 AM) 04/14/24: Reports during TOC call, completed course - reports refill may be needed   rosuvastatin  (CRESTOR ) 10 MG tablet [id number] Yes Take 1 tablet (10 mg total) by mouth daily. Austine Lefort, MD Taking Active Self, Pharmacy Records, Multiple Informants           Home Care and Equipment/Supplies: Were Home Health Services Ordered?: No Any new equipment or medical supplies ordered?: No  Functional Questionnaire: Do you need assistance with bathing/showering or dressing?: No Do you need assistance with meal preparation?: No Do you need assistance with eating?: No Do you have difficulty maintaining continence: No Do you need assistance with getting out of bed/getting out of a chair/moving?: No Do you have difficulty managing or taking your medications?: No  Follow up appointments reviewed: PCP Follow-up appointment confirmed?: Yes (care coordination outreach in real-time with scheduling care guide to successfully schedule hospital follow up PCP appointment 04/21/24) Date of PCP follow-up appointment?: 04/21/24 Follow-up Provider: PCP Specialist Hospital Follow-up appointment confirmed?: No Reason Specialist Follow-Up Not Confirmed: Patient has Specialist Provider Number and will Call for Appointment Do you need transportation to your follow-up appointment?:  No Do you understand care options if your condition(s) worsen?: Yes-patient verbalized understanding  SDOH Interventions Today    Flowsheet Row Most Recent Value  SDOH Interventions   Food Insecurity Interventions Intervention Not Indicated  Housing Interventions Intervention Not Indicated  Transportation Interventions Intervention Not Indicated  Utilities Interventions Intervention Not Indicated      Patient declines need for ongoing/ further care management/ coordination outreach; declines enrollment in 30-day TOC program- declines taking my direct phone number should needs/ concerns arise post-TOC call   See TOC assessment tabs for additional assessment/ TOC intervention information  Pls call/ message for questions,  Shervon Kerwin Mckinney Boyd [place], RN, BSN, CCRN Alumnus RN Care Manager  Transitions of Care  [facility] [phone]: direct office

## 2024-04-16 LAB — CULTURE, BLOOD (ROUTINE X 2): Culture: NO GROWTH

## 2024-04-17 LAB — CULTURE, BLOOD (ROUTINE X 2): Culture: NO GROWTH

## 2024-04-21 ENCOUNTER — Other Ambulatory Visit: Payer: Self-pay

## 2024-04-21 ENCOUNTER — Encounter: Payer: Self-pay | Admitting: Family Medicine

## 2024-04-21 ENCOUNTER — Ambulatory Visit: Admitting: Family Medicine

## 2024-04-21 ENCOUNTER — Other Ambulatory Visit (HOSPITAL_COMMUNITY): Payer: Self-pay

## 2024-04-21 VITALS — BP 130/80 | HR 58 | Temp 98.7°F | Ht 66.0 in | Wt 201.6 lb

## 2024-04-21 DIAGNOSIS — E871 Hypo-osmolality and hyponatremia: Secondary | ICD-10-CM

## 2024-04-21 DIAGNOSIS — R062 Wheezing: Secondary | ICD-10-CM

## 2024-04-21 DIAGNOSIS — R112 Nausea with vomiting, unspecified: Secondary | ICD-10-CM

## 2024-04-21 DIAGNOSIS — M1711 Unilateral primary osteoarthritis, right knee: Secondary | ICD-10-CM

## 2024-04-21 DIAGNOSIS — J690 Pneumonitis due to inhalation of food and vomit: Secondary | ICD-10-CM | POA: Insufficient documentation

## 2024-04-21 DIAGNOSIS — K219 Gastro-esophageal reflux disease without esophagitis: Secondary | ICD-10-CM

## 2024-04-21 DIAGNOSIS — E7800 Pure hypercholesterolemia, unspecified: Secondary | ICD-10-CM | POA: Diagnosis not present

## 2024-04-21 MED ORDER — PANTOPRAZOLE SODIUM 40 MG PO TBEC
40.0000 mg | DELAYED_RELEASE_TABLET | ORAL | 1 refills | Status: DC | PRN
  Filled 2024-04-21 (×2): qty 90, 90d supply, fill #0

## 2024-04-21 MED ORDER — POTASSIUM CHLORIDE ER 10 MEQ PO TBCR: 10.0000 meq | EXTENDED_RELEASE_TABLET | Freq: Every day | ORAL | 0 refills | Status: DC

## 2024-04-21 MED ORDER — ROSUVASTATIN CALCIUM 10 MG PO TABS
10.0000 mg | ORAL_TABLET | Freq: Every day | ORAL | 3 refills | Status: AC
  Filled 2024-04-21 (×2): qty 90, 90d supply, fill #0

## 2024-04-21 MED ORDER — MELOXICAM 15 MG PO TABS
15.0000 mg | ORAL_TABLET | Freq: Every day | ORAL | 3 refills | Status: AC
Start: 2024-04-21 — End: ?
  Filled 2024-04-21 (×2): qty 30, 30d supply, fill #0

## 2024-04-21 MED ORDER — PROMETHAZINE HCL 25 MG PO TABS
25.0000 mg | ORAL_TABLET | Freq: Four times a day (QID) | ORAL | 1 refills | Status: DC | PRN
  Filled 2024-04-21 (×2): qty 10, 3d supply, fill #0
  Filled 2024-05-02: qty 10, 3d supply, fill #1

## 2024-04-21 MED ORDER — ASPIRIN 81 MG PO TBEC
81.0000 mg | DELAYED_RELEASE_TABLET | Freq: Two times a day (BID) | ORAL | 0 refills | Status: AC
Start: 2024-04-21 — End: 2025-04-21
  Filled 2024-04-21 (×2): qty 120, 60d supply, fill #0

## 2024-04-21 MED ORDER — DOCUSATE SODIUM 100 MG PO CAPS
100.0000 mg | ORAL_CAPSULE | Freq: Every day | ORAL | 2 refills | Status: AC | PRN
  Filled 2024-04-21 (×2): qty 100, 100d supply, fill #0
  Filled 2024-10-06: qty 100, 100d supply, fill #1

## 2024-04-21 MED ORDER — ALBUTEROL SULFATE HFA 108 (90 BASE) MCG/ACT IN AERS
2.0000 | INHALATION_SPRAY | Freq: Four times a day (QID) | RESPIRATORY_TRACT | 3 refills | Status: AC | PRN
  Filled 2024-04-21 (×2): qty 18, 25d supply, fill #0
  Filled 2024-10-06 – 2024-10-09 (×2): qty 18, 25d supply, fill #1

## 2024-04-21 NOTE — Progress Notes (Signed)
 Subjective:    Patient ID: Adrienne Garcia, female    DOB: Oct 14, 1966, 58 y.o.   MRN: [id number]  Admit date: 04/11/2024 Discharge date: 04/13/2024   Admitted From: Home Discharge disposition: Home   Recommendations at discharge:  Complete the course of antibiotics with 5 more days of oral Augmentin  and 3 more days of oral doxycycline  Per radiology recommendation, follow-up PA and lateral chest X-ray is recommended in 3-4 weeks following trial of antibiotic therapy to ensure resolution and exclude underlying malignancy. Follow-up with PCP in 1 week for BMP, repeat urinalysis Outpatient referral for GI evaluation given     Brief narrative: Adrienne Garcia is a 58 y.o. female with PMH significant for obesity, HLD, GERD, anxiety, depression, PTSD, migraine 5/13, patient presented to ED with complaint of intractable nausea, vomiting, diarrhea for 5 day.  She was not able to keep anything down.  She also reported productive cough and a fever of 102 at home prior to the onset of symptoms.     In the ED, patient was afebrile, slightly tachycardic, blood pressure stable. Initial labs with sodium level significantly low at 119, potassium low at 2.9, WC count 13.6, lactic acid level normal Cloudy urine with trace leukocytes, more than 300 protein, large hemoglobin Chest x-ray compared to 3 months ago showed interval development of ill-defined right infrahilar opacity which may represent pneumonia, although neoplasm cannot be excluded. Blood culture sent Patient was started on IV fluid, IV antibiotics Admitted to [facility]   Subjective: Patient was seen and examined this morning.   Feels much better.  Able to tolerate diet.  No nausea vomiting or diarrhea.   Complains of heartburn which she has for quite some time and is on Protonix .     Assessment and plan: Severe hyponatremia Presented with sodium level significantly low at 119, serum osmolality low at 263, urine osmolality elevated to 473 and urine  sodium low at 14 In the setting of intractable nausea, vomiting, diarrhea and low intake for 5 days, I suspect hypovolemic hyponatremia.  There may also be some component of SIADH because of intractable nausea but it seems this is largely hypovolemic in nature. Was started on IV hydration. Sodium level gradually improved, 131 today.  Follow-up with PCP in 1 week for repeat BMP. Last Labs          Recent Labs  Lab 04/11/24 0509 04/11/24 1417 04/11/24 2103 04/12/24 0419 04/12/24 1219 04/13/24 0313  NA 119* 124* 130* 128* 129* 131*      Acute gastroenteritis Presented with intractable nausea, vomiting and diarrhea for 5 days Likely viral in nature Symptoms improving. Tolerating soft diet   Right infrahilar opacity Suspect aspiration pneumonia Could have aspirated during vomiting reported fever of 102 at home Currently on empiric antibiotic coverage with IV Rocephin  and doxycycline   Will discharge with 5 more days of oral Augmentin  and 3 days of doxycycline .  Her stop Per radiology recommendation, follow-up PA and lateral chest X-ray is recommended in 3-4 weeks following trial of antibiotic therapy to ensure resolution and exclude underlying malignancy.   Proteinuria Urinalysis on admission with urine albumin level more than 300.  Unclear reason.  Could be due to sickness.  I do not see any metabolic disease that could lead to CKD and proteinuria. Repeat UA as an outpatient.   GERD Complains of intermittent severe heartburn without obvious bleeding or black stool. On Protonix  at home for long time.  Has not had endoscopic evaluation.  Hemoglobin stable.  No need  of endoscopy evaluation in the hospital. Will benefit from outpatient evaluation by GI.  Outpatient referral given her stop   HLD Crestor    Independently able to ambulate.    04/21/24 Patient was recently admitted with right infrahilar pneumonia felt to be an aspiration pneumonia, severe nausea and vomiting thought to  be secondary to a virus, and profound hyponatremia and hypokalemia thought to be secondary to severe hypokalemia.  Her breathing has improved.  She denies any further fevers.  She denies any chest pain or shortness of breath.  She does continue to have some mild rhonchorous breath sounds in the right lung but this improves with coughing.  She denies any pleurisy.  She still very dizzy with standing and she does report a pressure-like sensation in her head.  She is due to recheck her sodium levels and her potassium levels. Past Medical History:  Diagnosis Date   Anemia    Anxiety    Arthritis    knees   Asthma    Basal cell carcinoma of neck    "front of my neck; it was a melanoma"   Bipolar disorder (HCC)    COPD (chronic obstructive pulmonary disease) (HCC)    Daily headache    Depression    Family history of adverse reaction to anesthesia    MOM-HARD TO WAKE UP   Fibromyalgia    GERD (gastroesophageal reflux disease)    Heart murmur    H/O   History of hiatal hernia    SMALL   Intractable nausea and vomiting    Migraine    "probably twice/month" (09/19/2015)   Obesity    PTSD (post-traumatic stress disorder)    Thyroid  disease    Past Surgical History:  Procedure Laterality Date   ABDOMINAL HYSTERECTOMY     CYSTOCELE REPAIR N/A 09/24/2020   Procedure: ANTERIOR REPAIR (CYSTOCELE);  Surgeon: Alben Alma, MD;  Location: [facility] ORS;  Service: Gynecology;  Laterality: N/A;   MOLE REMOVAL  ~ 2013   "front side of my neck"   MULTIPLE TOOTH EXTRACTIONS  ~ 2011   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   No current facility-administered medications on file prior to visit.   Allergies  Allergen Reactions   Ciprofloxacin Hives   Social History   Socioeconomic History   Marital status: Divorced    Spouse name: Not on file   Number of children: 2   Years of education: Not on file   Highest education level: Not on file  Occupational History   Not on file  Tobacco Use   Smoking status: Former    Current packs/day: 0.00    Average packs/day: 0.5 packs/day for 28.0 years (14.0 ttl pk-yrs)    Types: Cigarettes    Start date: 09/17/1985    Quit date: 09/17/2013    Years since quitting: 10.6    Passive exposure: Past   Smokeless tobacco: Never   Tobacco comments:    "quit smoking ~ 2014"  Vaping Use   Vaping status: Never Used  Substance and Sexual Activity   Alcohol use: No   Drug use: Not Currently  Types: Marijuana    Comment: 09/19/2015 "probably twice/yr":   Sexual activity: Not Currently  Other Topics Concern   Not on file  Social History Narrative   Not on file   Social Drivers of Health   Financial Resource Strain: Not on file  Food Insecurity: No Food Insecurity (04/14/2024)   Hunger Vital Sign    Worried About Running Out of Food in the Last Year: Never true    Ran Out of Food in the Last Year: Never true  Transportation Needs: No Transportation Needs (04/14/2024)   PRAPARE - Administrator, Civil Service (Medical): No    Lack of Transportation (Non-Medical): No  Physical Activity: Not on file  Stress: Not on file  Social Connections: Not on file  Intimate Partner Violence: Not At Risk (04/14/2024)   Humiliation, Afraid, Rape, and Kick questionnaire    Fear of Current or Ex-Partner: No    Emotionally Abused: No    Physically Abused: No    Sexually Abused: No     Review of Systems  All other systems reviewed and are negative.      Objective:   Physical Exam Vitals reviewed.  Constitutional:      Appearance:  Normal appearance. She is obese.  Cardiovascular:     Rate and Rhythm: Normal rate and regular rhythm.     Heart sounds: Normal heart sounds. No murmur heard.    No gallop.  Pulmonary:     Effort: Pulmonary effort is normal. No respiratory distress.     Breath sounds: Normal breath sounds. No wheezing, rhonchi or rales.  Musculoskeletal:     Right knee: Decreased range of motion. Tenderness present over the medial joint line and lateral joint line.  Neurological:     Mental Status: She is alert.           Assessment & Plan:   Aspiration pneumonia of right middle lobe, unspecified aspiration pneumonia type (HCC) - Plan: DG Chest 2 View  Gastroesophageal reflux disease, unspecified whether esophagitis present - Plan: promethazine  (PHENERGAN ) 25 MG tablet, pantoprazole  (PROTONIX ) 40 MG tablet  Intractable nausea and vomiting  Wheezing - Plan: albuterol  (VENTOLIN  HFA) 108 (90 Base) MCG/ACT inhaler  Primary osteoarthritis of right knee - Plan: meloxicam  (MOBIC ) 15 MG tablet  Elevated cholesterol - Plan: rosuvastatin  (CRESTOR ) 10 MG tablet  Hyponatremia - Plan: CBC with Differential/Platelet, Comprehensive metabolic panel with GFR  Clinically her pneumonia seems to have resolved.  I refilled several of her chronic medications.  I will repeat lab work today to monitor her sodium and potassium levels.  If sodium remains low, I would recommend instituting a fluid restriction for SIADH.  I also recommended the patient get an x-ray of her lungs in 2 weeks to ensure resolution of the opacity seen in the right lung.  I gave the patient the date and location to go get the x-ray in 2 weeks.

## 2024-04-22 LAB — COMPREHENSIVE METABOLIC PANEL WITH GFR
AG Ratio: 1.6 (calc) (ref 1.0–2.5)
ALT: 20 U/L (ref 6–29)
AST: 12 U/L (ref 10–35)
Albumin: 3.9 g/dL (ref 3.6–5.1)
Alkaline phosphatase (APISO): 76 U/L (ref 37–153)
BUN: 10 mg/dL (ref 7–25)
CO2: 30 mmol/L (ref 20–32)
Calcium: 9.6 mg/dL (ref 8.6–10.4)
Chloride: 101 mmol/L (ref 98–110)
Creat: 0.68 mg/dL (ref 0.50–1.03)
Globulin: 2.4 g/dL (ref 1.9–3.7)
Glucose, Bld: 108 mg/dL — ABNORMAL HIGH (ref 65–99)
Potassium: 3.7 mmol/L (ref 3.5–5.3)
Sodium: 140 mmol/L (ref 135–146)
Total Bilirubin: 0.5 mg/dL (ref 0.2–1.2)
Total Protein: 6.3 g/dL (ref 6.1–8.1)
eGFR: 102 mL/min/{1.73_m2} (ref >=60)

## 2024-04-22 LAB — CBC WITH DIFFERENTIAL/PLATELET
Absolute Lymphocytes: 2807 {cells}/uL (ref 850–3900)
Absolute Monocytes: 799 {cells}/uL (ref 200–950)
Basophils Absolute: 61 {cells}/uL (ref 0–200)
Basophils Relative: 0.5 %
Eosinophils Absolute: 133 {cells}/uL (ref 15–500)
Eosinophils Relative: 1.1 %
HCT: 40.4 % (ref 35.0–45.0)
Hemoglobin: 13.3 g/dL (ref 11.7–15.5)
MCH: 31.4 pg (ref 27.0–33.0)
MCHC: 32.9 g/dL (ref 32.0–36.0)
MCV: 95.5 fL (ref 80.0–100.0)
MPV: 10.2 fL (ref 7.5–12.5)
Monocytes Relative: 6.6 %
Neutro Abs: 8301 {cells}/uL — ABNORMAL HIGH (ref 1500–7800)
Neutrophils Relative %: 68.6 %
Platelets: 382 10*3/uL (ref 140–400)
RBC: 4.23 10*6/uL (ref 3.80–5.10)
RDW: 12.8 % (ref 11.0–15.0)
Total Lymphocyte: 23.2 %
WBC: 12.1 10*3/uL — ABNORMAL HIGH (ref 3.8–10.8)

## 2024-04-24 ENCOUNTER — Ambulatory Visit: Payer: Self-pay | Admitting: Family Medicine

## 2024-04-26 ENCOUNTER — Ambulatory Visit: Payer: Self-pay

## 2024-04-26 NOTE — Telephone Encounter (Signed)
 Chief Complaint: H/A Symptoms: H/A, nausea, dizziness, chest pain Frequency: 1.5 wks Pertinent Negatives: Patient denies SOB, one-sided weakness Disposition: [x] ED /[] Urgent Care (no appt availability in office) / [] Appointment(In office/virtual)/ []  [facility]/ [] Home Care/ [x] Refused Recommended Disposition /[] [facility]/ []  Follow-up with PCP Additional Notes: Pt initially called to inquire about her lab results from 5/23. Pt seen in the office on that day as well. Pt recently discharged from the hospital with pneumonia and vomiting. RN relayed to pt her results and Dr. Monty App note.  Pt was prescribed "salt pills" on 5/23. (Potassium chloride  supplement). Pt wondering if she needs to take that medication since her Na and K are normal. RN advised pt RN would relay question to the office for follow-up/clarity.  Then, pt reports a worsening H/A. Pt states she's had a headache since she got sick. Pt reports nausea but states that is improving and she has not vomited since Saturday. Pt states she is eating and drinking and able to keep that down with Phenergan . States she is not eating normal meals though and experiencing some dizziness. Urinating normally.  Then, pt states she has had intermittent CP for the last few weeks. Pt states CP has occurred about 6x, most recent episode yesterday. None now. States last night CP was a 9/10 and felt like a pressure and a stabbing. States she becomes diaphoretic with the pain and clammy. RN advised pt she needs to go to the ED for those symptoms. Pt declined.  RN advised if her CP returns or she becomes SOB or develops worsening symptoms she needs to call 911. Pt agreeable and verbalized understanding. Pt would appreciate follow-up in regards to the potassium chloride .      Copied from CRM [id number]. Topic: Clinical - Lab/Test Results >> Apr 26, 2024 11:14 AM Baldomero Bone wrote: Reason for CRM: Patient is calling for lab  results. Reason for Disposition  [1] Chest pain (or "angina") comes and goes AND [2] is happening more often (increasing in frequency) or getting worse (increasing in severity)  (Exception: Chest pains that last only a few seconds.)  Answer Assessment - Initial Assessment Questions 1. LOCATION: "Where does it hurt?"      Front and the temples  2. ONSET: "When did the headache start?" (Minutes, hours or days)      "I've had a headache since all this been going on", 1.5 wks 3. PATTERN: "Does the pain come and go, or has it been constant since it started?"     Constant  4. SEVERITY: "How bad is the pain?" and "What does it keep you from doing?"  (e.g., Scale 1-10; mild, moderate, or severe)   - MILD (1-3): doesn't interfere with normal activities    - MODERATE (4-7): interferes with normal activities or awakens from sleep    - SEVERE (8-10): excruciating pain, unable to do any normal activities        7/10; "I think that is from not eating a good meal like I normally do" 5. RECURRENT SYMPTOM: "Have you ever had headaches before?" If Yes, ask: "When was the last time?" and "What happened that time?"      No  6. CAUSE: "What do you think is causing the headache?"     Not eating enough  7. MIGRAINE: "Have you been diagnosed with migraine headaches?" If Yes, ask: "Is this headache similar?"      "No but I think I have them" 8. HEAD INJURY: "Has there been  any recent injury to the head?"      No  9. OTHER SYMPTOMS: "Do you have any other symptoms?" (fever, stiff neck, eye pain, sore throat, cold symptoms)     Nausea, decreased PO intake. Pt states she had soup today, can keep down liquids with Phenergan . Eating mac and cheese. "Still have a little cough", states she finished her antibiotics. Urinating normally at this time. Endorses "a little" dizziness, every day. Able to stand and walk normally. "I did have a little chest pain all day yesterday." Off and on since she was sick for 2 wks. States it  comes and goes. Denies CP today. Denies difficulty breathing.  "Sometimes" she has blurry vision for a couple months. Denies one-sided weakness.   Has not vomited since Saturday - states she will see a GI specialist soon  Answer Assessment - Initial Assessment Questions 1. LOCATION: "Where does it hurt?"       L side, "right there at my heart and it went across to my R side"  2. RADIATION: "Does the pain go anywhere else?" (e.g., into neck, jaw, arms, back)     R arm  3. ONSET: "When did the chest pain begin?" (Minutes, hours or days)      Yesterday  4. PATTERN: "Does the pain come and go, or has it been constant since it started?"  "Does it get worse with exertion?"      Comes and goes  5. DURATION: "How long does it last" (e.g., seconds, minutes, hours)     10 minutes  6. SEVERITY: "How bad is the pain?"  (e.g., Scale 1-10; mild, moderate, or severe)    - MILD (1-3): doesn't interfere with normal activities     - MODERATE (4-7): interferes with normal activities or awakens from sleep    - SEVERE (8-10): excruciating pain, unable to do any normal activities       8-9/10 yesterday; "feels like there is a weight on my chest and a pain shooting through it" 7. CARDIAC RISK FACTORS: "Do you have any history of heart problems or risk factors for heart disease?" (e.g., angina, prior heart attack; diabetes, high blood pressure, high cholesterol, smoker, or strong family history of heart disease)     No hx of cardiac disease. Denies smoking 8. PULMONARY RISK FACTORS: "Do you have any history of lung disease?"  (e.g., blood clots in lung, asthma, emphysema, birth control pills)     Recent pneumonia, bronchitis, Flu  9. CAUSE: "What do you think is causing the chest pain?"     Not sure  10. OTHER SYMPTOMS: "Do you have any other symptoms?" (e.g., dizziness, nausea, vomiting, sweating, fever, difficulty breathing, cough)       6x CP in the last few weeks. States she breaks out into a sweat with CP.  Endorses clamminess. When the pain goes away the clamminess and sweating goes away  Protocols used: Headache-A-AH, Chest Pain-A-AH

## 2024-05-02 ENCOUNTER — Other Ambulatory Visit: Payer: Self-pay

## 2024-05-11 DIAGNOSIS — Z419 Encounter for procedure for purposes other than remedying health state, unspecified: Secondary | ICD-10-CM | POA: Diagnosis not present

## 2024-05-21 NOTE — Progress Notes (Deleted)
 Ellouise Console, PA-C [address] [place], [place]  [address] Phone: [phone]   Gastroenterology Consultation  Referring Provider:     Duanne Butler DASEN, MD Primary Care Physician:  Duanne Butler DASEN, MD Primary Gastroenterologist:  Ellouise Console, PA-C / *** Reason for Consultation:     ED follow-up nausea and vomiting        HPI:   Adrienne Garcia is a 58 y.o. y/o female referred for consultation & management  by Duanne Butler DASEN, MD.    New patient.  Is here for ED follow-up of chronic nausea and vomiting, ongoing for many years.  She was admitted to [facility] 04/11/2024 until 04/13/2024 for aspiration pneumonia.  Treated with Augmentin  and doxycycline .  Had severe hyponatremia (sodium 119) and hypokalemia treated with IV fluids.  She followed up with her PCP at [facility].  Had repeat labs and scheduled and repeat chest x-ray for follow-up.  Potassium improved from 3.3 up to 3.7.  Sodium improved from 131-140.  Normal LFTs.  WBC 12.1, Hgb 13.3.  No previous EGD, colonoscopy, or GI evaluation.  No recent abdominal ultrasound.  03/2020 last CT abdomen pelvis with contrast (to evaluate nausea, vomiting, RLQ pain): Moderate hiatal hernia, normal liver, normal gallbladder, no gallstones, normal pancreas, diverticulosis but no diverticulitis.  No acute abnormality.  PMH: Bipolar disorder, depression, fibromyalgia, bronchitis, asthma, COPD, history of pneumonia.  Quit smoking tobacco in 2014.  History of marijuana use.  08/2023 echo showed LVEF 60 to 65%.  History of GERD, PTSD, obesity, migraine.  Past Medical History:  Diagnosis Date   Anemia    Anxiety    Arthritis    knees   Asthma    Basal cell carcinoma of neck    front of my neck; it was a melanoma   Bipolar disorder (HCC)    COPD (chronic obstructive pulmonary disease) (HCC)    Daily headache    Depression    Family history of adverse reaction to anesthesia    MOM-HARD TO WAKE UP    Fibromyalgia    GERD (gastroesophageal reflux disease)    Heart murmur    H/O   History of hiatal hernia    SMALL   Intractable nausea and vomiting    Migraine    probably twice/month (09/19/2015)   Obesity    PTSD (post-traumatic stress disorder)    Thyroid  disease     Past Surgical History:  Procedure Laterality Date   ABDOMINAL HYSTERECTOMY     CYSTOCELE REPAIR N/A 09/24/2020   Procedure: ANTERIOR REPAIR (CYSTOCELE);  Surgeon: Arloa Lamar SQUIBB, MD;  Location: [facility] ORS;  Service: Gynecology;  Laterality: N/A;   MOLE REMOVAL  ~ 2013   front side of my neck   MULTIPLE TOOTH EXTRACTIONS  ~ 2011   TOTAL KNEE ARTHROPLASTY Left 09/06/2023   Procedure: LEFT TOTAL KNEE ARTHROPLASTY;  Surgeon: Jerri Kay HERO, MD;  Location: [facility];  Service: Orthopedics;  Laterality: Left;   VAGINAL HYSTERECTOMY Bilateral 09/24/2020   Procedure: HYSTERECTOMY VAGINAL BILATERAL SALPINGO-OOPHERECTOMY;  Surgeon: Arloa Lamar SQUIBB, MD;  Location: [facility] ORS;  Service: Gynecology;  Laterality: Bilateral;    Prior to Admission medications   Medication Sig Start Date End Date Taking? Authorizing Provider  albuterol  (VENTOLIN  HFA) 108 (90 Base) MCG/ACT inhaler Inhale 2 puffs into the lungs every 6 (six) hours as needed for wheezing or shortness of breath. 04/21/24   Duanne Butler DASEN, MD  aspirin  EC 81 MG tablet Take  1 tablet (81 mg total) by mouth 2 (two) times daily. To be taken after surgery to prevent blood clots 04/21/24 04/21/25  Duanne Butler DASEN, MD  docusate sodium  (COLACE) 100 MG capsule Take 1 capsule (100 mg total) by mouth daily as needed. 04/21/24 04/21/25  Duanne Butler DASEN, MD  meloxicam  (MOBIC ) 15 MG tablet Take 1 tablet (15 mg total) by mouth daily. 04/21/24   Duanne Butler DASEN, MD  pantoprazole  (PROTONIX ) 40 MG tablet Take 1 tablet (40 mg total) by mouth as needed. 04/21/24   Duanne Butler DASEN, MD  potassium chloride  (KLOR-CON ) 10 MEQ tablet Take 1 tablet (10 mEq total) by mouth daily. 04/21/24   Duanne Butler DASEN, MD  promethazine  (PHENERGAN ) 25 MG tablet Take 1 tablet (25 mg total) by mouth every 6 (six) hours as needed for nausea or vomiting. 04/21/24   Duanne Butler DASEN, MD  rosuvastatin  (CRESTOR ) 10 MG tablet Take 1 tablet (10 mg total) by mouth daily. 04/21/24   Duanne Butler DASEN, MD    Family History  Problem Relation Age of Onset   Heart attack Mother    CAD Mother    Breast cancer Sister    Hypertension Other        sibling   Diabetes Other        sibling   Colon cancer Neg Hx    Colon polyps Neg Hx    Esophageal cancer Neg Hx    Rectal cancer Neg Hx    Stomach cancer Neg Hx      Social History   Tobacco Use   Smoking status: Former    Current packs/day: 0.00    Average packs/day: 0.5 packs/day for 28.0 years (14.0 ttl pk-yrs)    Types: Cigarettes    Start date: 09/17/1985    Quit date: 09/17/2013    Years since quitting: 10.6    Passive exposure: Past   Smokeless tobacco: Never   Tobacco comments:    quit smoking ~ 2014  Vaping Use   Vaping status: Never Used  Substance Use Topics   Alcohol use: No   Drug use: Not Currently    Types: Marijuana    Comment: 09/19/2015 probably twice/yr:    Allergies as of 05/22/2024 - Review Complete 04/14/2024  Allergen Reaction Noted   Ciprofloxacin Hives 12/16/2011    Review of Systems:    All systems reviewed and negative except where noted in HPI.   Physical Exam:  LMP 12/16/2011  Patient's last menstrual period was 12/16/2011.  General:   Alert,  Well-developed, well-nourished, pleasant and cooperative in NAD Lungs:  Respirations even and unlabored.  Clear throughout to auscultation.   No wheezes, crackles, or rhonchi. No acute distress. Heart:  Regular rate and rhythm; no murmurs, clicks, rubs, or gallops. Abdomen:  Normal bowel sounds.  No bruits.  Soft, and non-distended without masses, hepatosplenomegaly or hernias noted.  No Tenderness.  No guarding or rebound tenderness.    Neurologic:  Alert and  oriented x3;  grossly normal neurologically. Psych:  Alert and cooperative. Normal mood and affect.  Imaging Studies: No results found.  Labs: CBC    Component Value Date/Time   WBC 12.1 (H) 04/21/2024 1020   RBC 4.23 04/21/2024 1020   HGB 13.3 04/21/2024 1020   HCT 40.4 04/21/2024 1020   PLT 382 04/21/2024 1020   MCV 95.5 04/21/2024 1020   MCH 31.4 04/21/2024 1020   MCHC 32.9 04/21/2024 1020   RDW 12.8 04/21/2024 1020   LYMPHSABS 1.3  04/13/2024 0648   MONOABS 1.0 04/13/2024 0648   EOSABS 133 04/21/2024 1020   BASOSABS 61 04/21/2024 1020    CMP     Component Value Date/Time   NA 140 04/21/2024 1020   K 3.7 04/21/2024 1020   CL 101 04/21/2024 1020   CO2 30 04/21/2024 1020   GLUCOSE 108 (H) 04/21/2024 1020   BUN 10 04/21/2024 1020   CREATININE 0.68 04/21/2024 1020   CALCIUM  9.6 04/21/2024 1020   PROT 6.3 04/21/2024 1020   ALBUMIN 2.9 (L) 04/11/2024 0509   AST 12 04/21/2024 1020   ALT 20 04/21/2024 1020   ALKPHOS 74 04/11/2024 0509   BILITOT 0.5 04/21/2024 1020   GFRNONAA >60 04/13/2024 0313   GFRAA >60 04/28/2020 1932    Assessment and Plan:   Adrienne Garcia is a 58 y.o. y/o female has been referred for   1.  Chronic nausea and vomiting 2.  Recent hospitalization for aspiration pneumonia 3.  GERD 4.  Hiatal hernia 5.  Colon cancer screening  Plan: - RUQ ultrasound -evaluate for gallstones - Start PPI to treat GERD; pantoprazole  40 Mg twice daily - Plan to schedule EGD after pneumonia has completely resolved; need to confirm with follow-up chest x-ray which has already been ordered by her PCP - Plan to schedule first screening colonoscopy after she has completely recovered from pneumonia. - Schedule modified barium swallow test to check for aspiration?   Follow up ***  Ellouise Console, PA-C

## 2024-05-22 ENCOUNTER — Ambulatory Visit: Admitting: Physician Assistant

## 2024-06-09 ENCOUNTER — Other Ambulatory Visit (HOSPITAL_COMMUNITY): Payer: Self-pay

## 2024-06-09 ENCOUNTER — Encounter: Payer: Self-pay | Admitting: Family Medicine

## 2024-06-09 ENCOUNTER — Ambulatory Visit: Admitting: Family Medicine

## 2024-06-09 ENCOUNTER — Ambulatory Visit: Payer: Self-pay

## 2024-06-09 VITALS — BP 122/80 | HR 51 | Ht 66.0 in | Wt 206.6 lb

## 2024-06-09 DIAGNOSIS — K219 Gastro-esophageal reflux disease without esophagitis: Secondary | ICD-10-CM

## 2024-06-09 DIAGNOSIS — M1711 Unilateral primary osteoarthritis, right knee: Secondary | ICD-10-CM | POA: Diagnosis not present

## 2024-06-09 MED ORDER — PANTOPRAZOLE SODIUM 40 MG PO TBEC
40.0000 mg | DELAYED_RELEASE_TABLET | Freq: Every day | ORAL | 3 refills | Status: AC | PRN
  Filled 2024-06-09 – 2024-06-12 (×2): qty 90, 90d supply, fill #0
  Filled 2024-08-07: qty 90, 90d supply, fill #1
  Filled 2024-10-06 – 2024-10-30 (×3): qty 90, 90d supply, fill #2

## 2024-06-09 MED ORDER — PROMETHAZINE HCL 25 MG PO TABS
25.0000 mg | ORAL_TABLET | Freq: Four times a day (QID) | ORAL | 3 refills | Status: DC | PRN
  Filled 2024-06-09 – 2024-06-12 (×2): qty 30, 8d supply, fill #0

## 2024-06-09 NOTE — Progress Notes (Signed)
 Subjective:    Patient ID: Adrienne Garcia, female    DOB: May 07, 1966, 58 y.o.   MRN: [id number]  Knee Pain    02/04/23 Patient has not been seen in quite some time.  She presents today with complaint of chronic pain in both knees.  She states this got to the point she can barely walk.  When she puts weight on her knees, she will feel a sharp stabbing pain that catches her from time to time.  She also reports crepitus in both knee joints.  She reports periodic effusions.  She has pain with range of motion.  She states that she is not able to fully flex her left knee due to pain.  She denies any laxity.  The knee does not feel like it is going to give out.  She denies any locking in the knee.  On physical exam there is no laxity to varus or valgus stress and she has negative anterior and posterior drawer sign however she has significant tenderness to palpation of the medial joint line bilaterally.  She has crepitus with passive range of motion.  She has pain anteriorly with Apley grind in both knees.  At that time, my plan was:  I believe the patient has severe osteoarthritis in both knees.  I suspect bone-on-bone.  Obtain x-rays of both knees to characterize further.  Using sterile technique, I injected the right knee with 2 cc lidocaine , 2 cc of Marcaine , and 2 cc of 40 mg/mL Kenalog .  Patient tolerated procedure well without complication.  I then performed the same injection on the left knee using 2 cc lidocaine , 2 cc of Marcaine , and 2 cc of 40 mg/mL Kenalog .  Reassess in 1 month to see how the patient is doing or sooner if worsening.  06/09/24 Patient had a left knee replacement last fall.  She is planning to have her right knee replaced in December.  She is complaining of severe pain in her right knee with ambulation.  She has pain bearing weight on her knee.  She has pain standing up from a seated position.  The pain is located primarily over the medial joint line.  She has a small effusion.  There  is no erythema or warmth.  She denies any laxity varus valgus test.  Past Medical History:  Diagnosis Date   Anemia    Anxiety    Arthritis    knees   Asthma    Basal cell carcinoma of neck    front of my neck; it was a melanoma   Bipolar disorder (HCC)    COPD (chronic obstructive pulmonary disease) (HCC)    Daily headache    Depression    Family history of adverse reaction to anesthesia    MOM-HARD TO WAKE UP   Fibromyalgia    GERD (gastroesophageal reflux disease)    Heart murmur    H/O   History of hiatal hernia    SMALL   Intractable nausea and vomiting    Migraine    probably twice/month (09/19/2015)   Obesity    PTSD (post-traumatic stress disorder)    Thyroid  disease    Past Surgical History:  Procedure Laterality Date   ABDOMINAL HYSTERECTOMY     CYSTOCELE REPAIR N/A 09/24/2020   Procedure: ANTERIOR REPAIR (CYSTOCELE);  Surgeon: Arloa Lamar SQUIBB, MD;  Location: [facility] ORS;  Service: Gynecology;  Laterality: N/A;   MOLE REMOVAL  ~ 2013   front side of my neck   MULTIPLE TOOTH  EXTRACTIONS  ~ 2011   TOTAL KNEE ARTHROPLASTY Left 09/06/2023   Procedure: LEFT TOTAL KNEE ARTHROPLASTY;  Surgeon: Jerri Kay HERO, MD;  Location: [facility];  Service: Orthopedics;  Laterality: Left;   VAGINAL HYSTERECTOMY Bilateral 09/24/2020   Procedure: HYSTERECTOMY VAGINAL BILATERAL SALPINGO-OOPHERECTOMY;  Surgeon: Arloa Lamar SQUIBB, MD;  Location: [facility] OR[facility];  Service: Gynecology;  Laterality: Bilateral;   Current Outpatient Medications on File Prior to Visit  Medication Sig Dispense Refill   albuterol  (VENTOLIN  HFA) 108 (90 Base) MCG/ACT inhaler Inhale 2 puffs into the lungs every 6 (six) hours as needed for wheezing or shortness of breath. 18 g 3   aspirin  EC 81 MG tablet Take 1 tablet (81 mg total) by mouth 2 (two) times daily. To be taken after surgery to prevent blood clots 120 tablet 0   docusate sodium  (COLACE) 100 MG capsule Take 1 capsule (100 mg total) by mouth daily as needed. 100  capsule 2   meloxicam  (MOBIC ) 15 MG tablet Take 1 tablet (15 mg total) by mouth daily. 30 tablet 3   pantoprazole  (PROTONIX ) 40 MG tablet Take 1 tablet (40 mg total) by mouth as needed. 90 tablet 1   potassium chloride  (KLOR-CON ) 10 MEQ tablet Take 1 tablet (10 mEq total) by mouth daily. 30 tablet 0   promethazine  (PHENERGAN ) 25 MG tablet Take 1 tablet (25 mg total) by mouth every 6 (six) hours as needed for nausea or vomiting. 10 tablet 1   rosuvastatin  (CRESTOR ) 10 MG tablet Take 1 tablet (10 mg total) by mouth daily. 90 tablet 3   No current facility-administered medications on file prior to visit.   Allergies  Allergen Reactions   Ciprofloxacin Hives     Review of Systems     Objective:   Physical Exam Constitutional:      General: She is not in acute distress.    Appearance: She is obese. She is not ill-appearing or toxic-appearing.  Cardiovascular:     Rate and Rhythm: Normal rate and regular rhythm.     Heart sounds: Normal heart sounds.  Pulmonary:     Effort: Pulmonary effort is normal.     Breath sounds: Normal breath sounds.  Musculoskeletal:     Right knee: Deformity and effusion present. Decreased range of motion. Tenderness present over the medial joint line. No LCL laxity, MCL laxity, ACL laxity or PCL laxity. Normal meniscus.  Neurological:     Mental Status: She is alert.           Assessment & Plan:  Primary osteoarthritis of right knee  Gastroesophageal reflux disease, unspecified whether esophagitis present - Plan: pantoprazole  (PROTONIX ) 40 MG tablet, promethazine  (PHENERGAN ) 25 MG tablet Using sterile technique, I injected the right knee with 2 cc of lidocaine , 2 cc of Marcaine , and 2 cc of 40 mg/mL Kenalog .  Patient tolerated procedure well without complication.  I refilled her pantoprazole  that she uses daily for acid reflux.  She also requested a refill on her Phenergan  that she needs for nausea.  She uses this medication several times a week.  She  has appointment to see GI in 1 month to evaluate her chronic nausea

## 2024-06-09 NOTE — Telephone Encounter (Signed)
 FYI Only or Action Required?: FYI only for provider.  Patient was last seen in primary care on 04/21/2024 by Adrienne Butler DASEN, MD.  Called Nurse Triage reporting Knee Pain.  Symptoms began ongoing. Knee pain-pt wants an injection into knee for pain  Interventions attempted: Nothing.  Symptoms are: gradually worsening.  Triage Disposition: See PCP When Office is Open (Within 3 Days)  Patient/caregiver understands and will follow disposition?: Yes                 Copied from CRM [id number]. Topic: Clinical - Red Word Triage >> Jun 09, 2024  8:19 AM Larissa RAMAN wrote: [facility] that prompted transfer to Nurse Triage: RT knee pain Reason for Disposition  [1] MODERATE pain (e.g., interferes with normal activities, limping) AND [2] present > 3 days  Answer Assessment - Initial Assessment Questions 1. LOCATION and RADIATION: Where is the pain located?      Right knee 2. QUALITY: What does the pain feel like?  (e.g., sharp, dull, aching, burning)     sharp 3. SEVERITY: How bad is the pain? What does it keep you from doing?   (Scale 1-10; or mild, moderate, severe)     8/10 4. ONSET: When did the pain start? Does it come and go, or is it there all the time?     ongoing 5. RECURRENT: Have you had this pain before? If Yes, ask: When, and what happened then?     yes 6. SETTING: Has there been any recent work, exercise or other activity that involved that part of the body?      no 8. ASSOCIATED SYMPTOMS: Is there any swelling or redness of the knee?     no 9. OTHER SYMPTOMS: Do you have any other symptoms? (e.g., calf pain, chest pain, difficulty breathing, fever)     no  Protocols used: Knee Pain-A-AH

## 2024-06-10 DIAGNOSIS — Z419 Encounter for procedure for purposes other than remedying health state, unspecified: Secondary | ICD-10-CM | POA: Diagnosis not present

## 2024-06-12 ENCOUNTER — Other Ambulatory Visit: Payer: Self-pay

## 2024-06-12 ENCOUNTER — Other Ambulatory Visit (HOSPITAL_COMMUNITY): Payer: Self-pay

## 2024-06-12 MED ORDER — BUPIVACAINE HCL 0.25 % IJ SOLN
5.0000 mL | Freq: Once | INTRAMUSCULAR | Status: AC
  Administered 2024-06-12: 5 mL

## 2024-06-12 MED ORDER — TRIAMCINOLONE ACETONIDE 40 MG/ML IJ SUSP
40.0000 mg | Freq: Once | INTRAMUSCULAR | Status: AC
  Administered 2024-06-12: 40 mg via INTRAMUSCULAR

## 2024-06-12 NOTE — Addendum Note (Signed)
 Addended by: CORINNA JESUSA SAUNDERS on: 06/12/2024 08:21 AM   Modules accepted: Orders

## 2024-07-10 ENCOUNTER — Emergency Department (HOSPITAL_COMMUNITY)

## 2024-07-10 ENCOUNTER — Encounter (HOSPITAL_COMMUNITY): Payer: Self-pay

## 2024-07-10 ENCOUNTER — Other Ambulatory Visit: Payer: Self-pay

## 2024-07-10 ENCOUNTER — Emergency Department (HOSPITAL_COMMUNITY): Admission: EM | Admit: 2024-07-10 | Discharge: 2024-07-10 | Disposition: A | Discharge status: Home / Self Care

## 2024-07-10 ENCOUNTER — Other Ambulatory Visit (HOSPITAL_COMMUNITY): Payer: Self-pay

## 2024-07-10 DIAGNOSIS — E871 Hypo-osmolality and hyponatremia: Secondary | ICD-10-CM | POA: Insufficient documentation

## 2024-07-10 DIAGNOSIS — J449 Chronic obstructive pulmonary disease, unspecified: Secondary | ICD-10-CM | POA: Insufficient documentation

## 2024-07-10 DIAGNOSIS — E878 Other disorders of electrolyte and fluid balance, not elsewhere classified: Secondary | ICD-10-CM | POA: Insufficient documentation

## 2024-07-10 DIAGNOSIS — R1084 Generalized abdominal pain: Secondary | ICD-10-CM | POA: Insufficient documentation

## 2024-07-10 DIAGNOSIS — D72829 Elevated white blood cell count, unspecified: Secondary | ICD-10-CM | POA: Insufficient documentation

## 2024-07-10 DIAGNOSIS — J189 Pneumonia, unspecified organism: Secondary | ICD-10-CM | POA: Diagnosis not present

## 2024-07-10 DIAGNOSIS — R0602 Shortness of breath: Secondary | ICD-10-CM | POA: Diagnosis not present

## 2024-07-10 DIAGNOSIS — R197 Diarrhea, unspecified: Secondary | ICD-10-CM | POA: Insufficient documentation

## 2024-07-10 DIAGNOSIS — R252 Cramp and spasm: Secondary | ICD-10-CM | POA: Insufficient documentation

## 2024-07-10 DIAGNOSIS — R718 Other abnormality of red blood cells: Secondary | ICD-10-CM | POA: Diagnosis not present

## 2024-07-10 DIAGNOSIS — R059 Cough, unspecified: Secondary | ICD-10-CM | POA: Diagnosis not present

## 2024-07-10 DIAGNOSIS — E876 Hypokalemia: Secondary | ICD-10-CM

## 2024-07-10 DIAGNOSIS — R112 Nausea with vomiting, unspecified: Secondary | ICD-10-CM | POA: Diagnosis not present

## 2024-07-10 DIAGNOSIS — R918 Other nonspecific abnormal finding of lung field: Secondary | ICD-10-CM | POA: Diagnosis not present

## 2024-07-10 DIAGNOSIS — J168 Pneumonia due to other specified infectious organisms: Secondary | ICD-10-CM | POA: Diagnosis not present

## 2024-07-10 LAB — URINALYSIS, ROUTINE W REFLEX MICROSCOPIC
Bilirubin Urine: NEGATIVE
Glucose, UA: NEGATIVE mg/dL
Ketones, ur: NEGATIVE mg/dL
Leukocytes,Ua: NEGATIVE
Nitrite: NEGATIVE
Protein, ur: 100 mg/dL — AB
Specific Gravity, Urine: 1.008 (ref 1.005–1.030)
pH: 6 (ref 5.0–8.0)

## 2024-07-10 LAB — CBC WITH DIFFERENTIAL/PLATELET
Abs Immature Granulocytes: 0.06 K/uL (ref 0.00–0.07)
Basophils Absolute: 0.1 K/uL (ref 0.0–0.1)
Basophils Relative: 0 %
Eosinophils Absolute: 0 K/uL (ref 0.0–0.5)
Eosinophils Relative: 0 %
HCT: 45.9 % (ref 36.0–46.0)
Hemoglobin: 15.2 g/dL — ABNORMAL HIGH (ref 12.0–15.0)
Immature Granulocytes: 0 %
Lymphocytes Relative: 19 %
Lymphs Abs: 2.8 K/uL (ref 0.7–4.0)
MCH: 31 pg (ref 26.0–34.0)
MCHC: 33.1 g/dL (ref 30.0–36.0)
MCV: 93.5 fL (ref 80.0–100.0)
Monocytes Absolute: 1.1 K/uL — ABNORMAL HIGH (ref 0.1–1.0)
Monocytes Relative: 7 %
Neutro Abs: 11.1 K/uL — ABNORMAL HIGH (ref 1.7–7.7)
Neutrophils Relative %: 74 %
Platelets: 225 K/uL (ref 150–400)
RBC: 4.91 MIL/uL (ref 3.87–5.11)
RDW: 13.8 % (ref 11.5–15.5)
WBC: 15.1 K/uL — ABNORMAL HIGH (ref 4.0–10.5)
nRBC: 0 % (ref 0.0–0.2)

## 2024-07-10 LAB — I-STAT CG4 LACTIC ACID, ED: Lactic Acid, Venous: 0.9 mmol/L (ref 0.5–1.9)

## 2024-07-10 LAB — BLOOD GAS, VENOUS
Acid-Base Excess: 11.2 mmol/L — ABNORMAL HIGH (ref 0.0–2.0)
Bicarbonate: 35.1 mmol/L — ABNORMAL HIGH (ref 20.0–28.0)
O2 Saturation: 92.8 %
Patient temperature: 37
pCO2, Ven: 42 mmHg — ABNORMAL LOW (ref 44–60)
pH, Ven: 7.53 — ABNORMAL HIGH (ref 7.25–7.43)
pO2, Ven: 65 mmHg — ABNORMAL HIGH (ref 32–45)

## 2024-07-10 LAB — COMPREHENSIVE METABOLIC PANEL WITH GFR
ALT: 17 U/L (ref 0–44)
AST: 22 U/L (ref 15–41)
Albumin: 4.2 g/dL (ref 3.5–5.0)
Alkaline Phosphatase: 93 U/L (ref 38–126)
Anion gap: 21 — ABNORMAL HIGH (ref 5–15)
BUN: 11 mg/dL (ref 6–20)
CO2: 24 mmol/L (ref 22–32)
Calcium: 9.9 mg/dL (ref 8.9–10.3)
Chloride: 89 mmol/L — ABNORMAL LOW (ref 98–111)
Creatinine, Ser: 0.53 mg/dL (ref 0.44–1.00)
GFR, Estimated: >60 mL/min (ref >60)
Glucose, Bld: 191 mg/dL — ABNORMAL HIGH (ref 70–99)
Potassium: 2.7 mmol/L — CL (ref 3.5–5.1)
Sodium: 134 mmol/L — ABNORMAL LOW (ref 135–145)
Total Bilirubin: 1.2 mg/dL (ref 0.0–1.2)
Total Protein: 7.4 g/dL (ref 6.5–8.1)

## 2024-07-10 LAB — LIPASE, BLOOD: Lipase: 27 U/L (ref 11–51)

## 2024-07-10 LAB — BASIC METABOLIC PANEL WITH GFR
Anion gap: 8 (ref 5–15)
BUN: 10 mg/dL (ref 6–20)
CO2: 29 mmol/L (ref 22–32)
Calcium: 8.3 mg/dL — ABNORMAL LOW (ref 8.9–10.3)
Chloride: 98 mmol/L (ref 98–111)
Creatinine, Ser: 0.61 mg/dL (ref 0.44–1.00)
GFR, Estimated: >60 mL/min (ref >60)
Glucose, Bld: 101 mg/dL — ABNORMAL HIGH (ref 70–99)
Potassium: 3.8 mmol/L (ref 3.5–5.1)
Sodium: 135 mmol/L (ref 135–145)

## 2024-07-10 LAB — MAGNESIUM: Magnesium: 1.6 mg/dL — ABNORMAL LOW (ref 1.7–2.4)

## 2024-07-10 MED ORDER — POTASSIUM CHLORIDE CRYS ER 20 MEQ PO TBCR
40.0000 meq | EXTENDED_RELEASE_TABLET | Freq: Once | ORAL | Status: AC
  Administered 2024-07-10 (×2): 40 meq via ORAL
  Filled 2024-07-10: qty 2

## 2024-07-10 MED ORDER — SODIUM CHLORIDE 0.9 % IV BOLUS
1000.0000 mL | Freq: Once | INTRAVENOUS | Status: AC
  Administered 2024-07-10 (×2): 1000 mL via INTRAVENOUS

## 2024-07-10 MED ORDER — POTASSIUM CHLORIDE 10 MEQ/100ML IV SOLN
10.0000 meq | Freq: Once | INTRAVENOUS | Status: AC
Start: 2024-07-10 — End: 2024-07-10
  Administered 2024-07-10 (×2): 10 meq via INTRAVENOUS
  Filled 2024-07-10: qty 100

## 2024-07-10 MED ORDER — LOPERAMIDE HCL 2 MG PO CAPS: 4.0000 mg | ORAL_CAPSULE | ORAL | Status: DC | PRN

## 2024-07-10 MED ORDER — MORPHINE SULFATE (PF) 4 MG/ML IV SOLN
4.0000 mg | Freq: Once | INTRAVENOUS | Status: AC
  Administered 2024-07-10 (×2): 4 mg via INTRAVENOUS
  Filled 2024-07-10: qty 1

## 2024-07-10 MED ORDER — ONDANSETRON HCL 4 MG/2ML IJ SOLN
4.0000 mg | Freq: Once | INTRAMUSCULAR | Status: AC
  Administered 2024-07-10 (×2): 4 mg via INTRAVENOUS
  Filled 2024-07-10: qty 2

## 2024-07-10 MED ORDER — DOXYCYCLINE HYCLATE 100 MG PO CAPS
100.0000 mg | ORAL_CAPSULE | Freq: Two times a day (BID) | ORAL | 0 refills | Status: AC
Start: 2024-07-10 — End: ?
  Filled 2024-07-10: qty 20, 10d supply, fill #0

## 2024-07-10 MED ORDER — SODIUM CHLORIDE 0.9 % IV SOLN
12.5000 mg | Freq: Four times a day (QID) | INTRAVENOUS | Status: DC | PRN
  Administered 2024-07-10 (×2): 12.5 mg via INTRAVENOUS
  Filled 2024-07-10: qty 12.5

## 2024-07-10 MED ORDER — PROMETHAZINE HCL 25 MG PO TABS
25.0000 mg | ORAL_TABLET | Freq: Four times a day (QID) | ORAL | 0 refills | Status: DC | PRN
  Filled 2024-07-10: qty 30, 8d supply, fill #0

## 2024-07-10 MED ORDER — LOPERAMIDE HCL 2 MG PO CAPS
2.0000 mg | ORAL_CAPSULE | Freq: Four times a day (QID) | ORAL | 0 refills | Status: AC | PRN
  Filled 2024-07-10: qty 12, 3d supply, fill #0

## 2024-07-10 NOTE — ED Notes (Signed)
 Patient instructed that we need a urine sample. Patient said she cannot go right now.

## 2024-07-10 NOTE — ED Notes (Signed)
 Patient still unable to provide urine sample

## 2024-07-10 NOTE — ED Triage Notes (Signed)
 Pt reports with nausea, vomiting, and diarrhea since Saturday. Pt states that she feels like she has food poisoning from the beach. Pt has hand cramps.

## 2024-07-10 NOTE — Discharge Instructions (Addendum)
 Take Phenergan  as needed for nausea and vomiting.  Take Imodium  as needed for loose stool.  Try bland diet.  Make sure you are getting lots of liquids primarily water  but you can alternate Gatorade or Pedialyte.  Return to emergency room if new or worsening symptoms.  I have sent an antibiotic to your pharmacy please take as prescribed.  You need a repeat chest x-ray in 3 to 4 weeks.

## 2024-07-10 NOTE — ED Notes (Signed)
Patient still unable to provide urine sample

## 2024-07-10 NOTE — ED Notes (Signed)
 Xray at bedside.

## 2024-07-10 NOTE — ED Notes (Signed)
 Patient stated she will take the potassium pills after the IV Phenergan .

## 2024-07-10 NOTE — ED Provider Notes (Signed)
 [place] EMERGENCY DEPARTMENT AT [facility] Provider Note   CSN: [id number] Arrival date & time: 07/10/24  9085     Patient presents with: Emesis   Adrienne Garcia is a 58 y.o. female with past medical history of COPD, GERD presents to emergency room with complaint of 2 days of nausea vomiting and diarrhea.  She notes over 10 episodes of nausea and nonbilious and nonbloody.  Over 10 episodes of loose stool.  She reports this started several hours after eating shrimp.  Denies any blood in stool.  Denies fever.  Reports generalized abdominal cramping and bilateral hand cramping.  Notes admission into the hospital for somewhat similar symptoms 5/13.    Emesis      Prior to Admission medications   Medication Sig Start Date End Date Taking? Authorizing Provider  albuterol  (VENTOLIN  HFA) 108 (90 Base) MCG/ACT inhaler Inhale 2 puffs into the lungs every 6 (six) hours as needed for wheezing or shortness of breath. 04/21/24   Duanne Butler DASEN, MD  aspirin  EC 81 MG tablet Take 1 tablet (81 mg total) by mouth 2 (two) times daily. To be taken after surgery to prevent blood clots 04/21/24 04/21/25  Duanne Butler DASEN, MD  docusate sodium  (COLACE) 100 MG capsule Take 1 capsule (100 mg total) by mouth daily as needed. Patient not taking: Reported on 06/09/2024 04/21/24 04/21/25  Duanne Butler DASEN, MD  meloxicam  (MOBIC ) 15 MG tablet Take 1 tablet (15 mg total) by mouth daily. 04/21/24   Duanne Butler DASEN, MD  pantoprazole  (PROTONIX ) 40 MG tablet Take 1 tablet (40 mg total) by mouth daily as needed. 06/09/24   Duanne Butler DASEN, MD  promethazine  (PHENERGAN ) 25 MG tablet Take 1 tablet (25 mg total) by mouth every 6 (six) hours as needed for nausea or vomiting. 06/09/24   Duanne Butler DASEN, MD  rosuvastatin  (CRESTOR ) 10 MG tablet Take 1 tablet (10 mg total) by mouth daily. 04/21/24   Duanne Butler DASEN, MD    Allergies: Ciprofloxacin    Review of Systems  Gastrointestinal:  Positive for vomiting.     Updated Vital Signs BP (!) 159/127 (BP Location: Left Arm)   Pulse 64   Temp 98.1 F (36.7 C) (Oral)   Resp (!) 21   Ht 5' 6 (1.676 m)   Wt 94.8 kg   LMP 12/16/2011   SpO2 99%   BMI 33.73 kg/m   Physical Exam Vitals and nursing note reviewed.  Constitutional:      General: She is not in acute distress.    Appearance: She is not toxic-appearing.  HENT:     Head: Normocephalic and atraumatic.  Eyes:     General: No scleral icterus.    Conjunctiva/sclera: Conjunctivae normal.  Cardiovascular:     Rate and Rhythm: Normal rate and regular rhythm.     Pulses: Normal pulses.     Heart sounds: Normal heart sounds.  Pulmonary:     Effort: Pulmonary effort is normal. No respiratory distress.     Breath sounds: Normal breath sounds.  Abdominal:     General: Abdomen is flat. Bowel sounds are normal.     Palpations: Abdomen is soft.     Tenderness: There is no abdominal tenderness.     Comments: Abdomen is soft, nontender nondistended.  Musculoskeletal:     Right lower leg: No edema.     Left lower leg: No edema.  Skin:    General: Skin is warm and dry.     Findings:  No lesion.  Neurological:     General: No focal deficit present.     Mental Status: She is alert and oriented to person, place, and time. Mental status is at baseline.     (all labs ordered are listed, but only abnormal results are displayed) Labs Reviewed  COMPREHENSIVE METABOLIC PANEL WITH GFR - Abnormal; Notable for the following components:      Result Value   Sodium 134 (*)    Potassium 2.7 (*)    Chloride 89 (*)    Glucose, Bld 191 (*)    Anion gap 21 (*)    All other components within normal limits  CBC WITH DIFFERENTIAL/PLATELET - Abnormal; Notable for the following components:   WBC 15.1 (*)    Hemoglobin 15.2 (*)    Neutro Abs 11.1 (*)    Monocytes Absolute 1.1 (*)    All other components within normal limits  LIPASE, BLOOD  URINALYSIS, ROUTINE W REFLEX MICROSCOPIC  MAGNESIUM  I-STAT  CG4 LACTIC ACID, ED  I-STAT VENOUS BLOOD GAS, ED    EKG: None  Radiology: No results found.   Procedures   Medications Ordered in the ED  promethazine  (PHENERGAN ) 12.5 mg in sodium chloride  0.9 % 50 mL IVPB (has no administration in time range)  loperamide  (IMODIUM ) capsule 4 mg (has no administration in time range)  sodium chloride  0.9 % bolus 1,000 mL (has no administration in time range)  potassium chloride  SA (KLOR-CON  M) CR tablet 40 mEq (has no administration in time range)  potassium chloride  10 mEq in 100 mL IVPB (has no administration in time range)  ondansetron  (ZOFRAN ) injection 4 mg (4 mg Intravenous Given 07/10/24 0936)  sodium chloride  0.9 % bolus 1,000 mL (1,000 mLs Intravenous New Bag/Given 07/10/24 0936)  morphine  (PF) 4 MG/ML injection 4 mg (4 mg Intravenous Given 07/10/24 0947)    Clinical Course as of 07/10/24 1309  Mon Jul 10, 2024  1253 Passed PO challenge, rechecking BMP [JB]    Clinical Course User Index [JB] Samule Life, Warren SAILOR, PA-C                                 Medical Decision Making Amount and/or Complexity of Data Reviewed Labs: ordered. Radiology: ordered.  Risk Prescription drug management.   This patient presents to the ED for concern of NVD, this involves an extensive number of treatment options, and is a complaint that carries with it a high risk of complications and morbidity.  The differential diagnosis includes gastroenteritis, appendicitis, cholecystitis, dehydration, electrolyte abnormality   Co morbidities that complicate the patient evaluation  GERD   Additional history obtained:  Additional history obtained from 04/11/24 ED visit and admit for NVD   Lab Tests:  I personally interpreted labs.  The pertinent results include:   Mild leukocytosis of 15, hemoglobin is also 15 question reactive due to dehydration. CMP with sodium 134, chloride 89, potassium 2.7, anion gap 21 Repeat BMP shows resolution of electrolyte  abnormality.  Resolution of anion gap.   Imaging Studies ordered:  I ordered imaging studies including chest x-ray ordered due to cough/upper abdominal pain  I independently visualized and interpreted imaging which showed probable right lower lobe pneumonia.  Recommending oral antibiotic treatment and repeat in 3 to 4 weeks I agree with the radiologist interpretation Discussed with patient who understands and agrees to plan.   Cardiac Monitoring: / EKG:  The patient was maintained on  a cardiac monitor.  I personally viewed and interpreted the cardiac monitored which showed an underlying rhythm of: sinus. Normal qt   Problem List / ED Course / Critical interventions / Medication management  Patient presents with 2 days nausea vomiting diarrhea after suspicious food intake.  On my exam abdomen is soft nondistended nontender thus did not obtain imaging of abdomen and pelvis.  She also notes some shortness of breath and cough.  She is in no acute distress.  No hypoxia.  Lungs are clear to auscultation.  X-ray shows probable right lower lobe pneumonia.  Recommending trial of antibiotic and repeat imaging in 3 to 4 weeks which I discussed with patient.  Labs did show multiple electrolyte abnormalities as well as anion gap which improved after fluids and potassium supplementation on repeat BMP.  Vital stable throughout stay.  Patient passed p.o. challenge.  Had no episodes of vomiting or diarrhea here.  Feel stable for discharge with outpatient follow-up. Will do better for pneumonia.  Will give Imodium  and Phenergan  for nausea vomiting diarrhea. I ordered medication including NS, potassium Reevaluation of the patient after these medicines showed that the patient improved I have reviewed the patients home medicines and have made adjustments as needed She was given return precautions.       Final diagnoses:  Nausea vomiting and diarrhea  Hypokalemia  Pneumonia due to infectious organism,  unspecified laterality, unspecified part of lung    ED Discharge Orders          Ordered    loperamide  (IMODIUM ) 2 MG capsule  4 times daily PRN        07/10/24 1308    promethazine  (PHENERGAN ) 25 MG tablet  Every 6 hours PRN        07/10/24 1308    doxycycline  (VIBRAMYCIN ) 100 MG capsule  2 times daily        07/10/24 1311               Terris Germano, Warren SAILOR, PA-C 07/10/24 1313    Neysa Caron PARAS, DO 07/10/24 1526

## 2024-07-11 DIAGNOSIS — Z419 Encounter for procedure for purposes other than remedying health state, unspecified: Secondary | ICD-10-CM | POA: Diagnosis not present

## 2024-07-16 NOTE — Progress Notes (Deleted)
 Ellouise Console, PA-C [address] [place], [place]  [address] Phone: [phone]   Gastroenterology Consultation  Referring Provider:     Duanne Butler DASEN, MD Primary Care Physician:  Duanne Butler DASEN, MD Primary Gastroenterologist:  Ellouise Console, PA-C / *** Reason for Consultation:     ED follow-up nausea and vomiting        HPI:   Adrienne Garcia is a 59 y.o. y/o female referred for consultation & management  by Duanne Butler DASEN, MD.    She went to [facility] the ED 07/10/2024 to evaluate acute nausea, vomiting, diarrhea for 2 days.  Had similar episode when she also require ED visit 04/11/2024.  She takes pantoprazole  40 Mg once daily for GERD.  Phenergan  as needed nausea.  Colace stool softener daily.  Chest x-ray showed probable right lower lobe pneumonia.  Started on antibiotics.  She had multiple electrolyte abnormalities treated with IV fluids.  Current symptoms:  History of small to moderate hiatal hernia seen on chest CT done 08/2023.  PMH: Bipolar disorder, asthma, bronchitis, chronic nausea and vomiting, arthritis, anxiety, depression.  Last abdominal pelvic CT 03/2020, to evaluate nausea, vomiting, RLQ pain, showed:  Moderate hiatal hernia,  Diverticulosis without diverticulitis,  Aortic Atherosclerosis  No previous EGD or colonoscopy.  She is overdue for for screening colonoscopy due to age.  Past Medical History:  Diagnosis Date   Anemia    Anxiety    Arthritis    knees   Asthma    Basal cell carcinoma of neck    front of my neck; it was a melanoma   Bipolar disorder (HCC)    COPD (chronic obstructive pulmonary disease) (HCC)    Daily headache    Depression    Family history of adverse reaction to anesthesia    MOM-HARD TO WAKE UP   Fibromyalgia    GERD (gastroesophageal reflux disease)    Heart murmur    H/O   History of hiatal hernia    SMALL   Intractable nausea and vomiting    Migraine    probably twice/month (09/19/2015)   Obesity    PTSD  (post-traumatic stress disorder)    Thyroid  disease     Past Surgical History:  Procedure Laterality Date   ABDOMINAL HYSTERECTOMY     CYSTOCELE REPAIR N/A 09/24/2020   Procedure: ANTERIOR REPAIR (CYSTOCELE);  Surgeon: Arloa Lamar SQUIBB, MD;  Location: [facility] ORS;  Service: Gynecology;  Laterality: N/A;   MOLE REMOVAL  ~ 2013   front side of my neck   MULTIPLE TOOTH EXTRACTIONS  ~ 2011   TOTAL KNEE ARTHROPLASTY Left 09/06/2023   Procedure: LEFT TOTAL KNEE ARTHROPLASTY;  Surgeon: Jerri Kay HERO, MD;  Location: [facility] OR;  Service: Orthopedics;  Laterality: Left;   VAGINAL HYSTERECTOMY Bilateral 09/24/2020   Procedure: HYSTERECTOMY VAGINAL BILATERAL SALPINGO-OOPHERECTOMY;  Surgeon: Arloa Lamar SQUIBB, MD;  Location: [facility] ORS;  Service: Gynecology;  Laterality: Bilateral;    Prior to Admission medications   Medication Sig Start Date End Date Taking? Authorizing Provider  albuterol  (VENTOLIN  HFA) 108 (90 Base) MCG/ACT inhaler Inhale 2 puffs into the lungs every 6 (six) hours as needed for wheezing or shortness of breath. 04/21/24   Duanne Butler DASEN, MD  aspirin  EC 81 MG tablet Take 1 tablet (81 mg total) by mouth 2 (two) times daily. To be taken after surgery to prevent blood clots 04/21/24 04/21/25  Duanne Butler DASEN, MD  docusate sodium  (COLACE) 100 MG capsule Take 1  capsule (100 mg total) by mouth daily as needed. Patient not taking: Reported on 06/09/2024 04/21/24 04/21/25  Duanne Butler DASEN, MD  doxycycline  (VIBRAMYCIN ) 100 MG capsule Take 1 capsule (100 mg total) by mouth 2 (two) times daily. 07/10/24   Barrett, Warren SAILOR, PA-C  loperamide  (IMODIUM ) 2 MG capsule Take 1 capsule (2 mg total) by mouth 4 (four) times daily as needed for diarrhea or loose stools. 07/10/24   Barrett, Warren SAILOR, PA-C  meloxicam  (MOBIC ) 15 MG tablet Take 1 tablet (15 mg total) by mouth daily. 04/21/24   Duanne Butler DASEN, MD  pantoprazole  (PROTONIX ) 40 MG tablet Take 1 tablet (40 mg total) by mouth daily as needed. 06/09/24    Duanne Butler DASEN, MD  promethazine  (PHENERGAN ) 25 MG tablet Take 1 tablet (25 mg total) by mouth every 6 (six) hours as needed for nausea or vomiting. 07/10/24   Barrett, Warren SAILOR, PA-C  rosuvastatin  (CRESTOR ) 10 MG tablet Take 1 tablet (10 mg total) by mouth daily. 04/21/24   Duanne Butler DASEN, MD    Family History  Problem Relation Age of Onset   Heart attack Mother    CAD Mother    Breast cancer Sister    Hypertension Other        sibling   Diabetes Other        sibling   Colon cancer Neg Hx    Colon polyps Neg Hx    Esophageal cancer Neg Hx    Rectal cancer Neg Hx    Stomach cancer Neg Hx      Social History   Tobacco Use   Smoking status: Former    Current packs/day: 0.00    Average packs/day: 0.5 packs/day for 28.0 years (14.0 ttl pk-yrs)    Types: Cigarettes    Start date: 09/17/1985    Quit date: 09/17/2013    Years since quitting: 10.8    Passive exposure: Past   Smokeless tobacco: Never   Tobacco comments:    quit smoking ~ 2014  Vaping Use   Vaping status: Never Used  Substance Use Topics   Alcohol use: No   Drug use: Not Currently    Types: Marijuana    Comment: 09/19/2015 probably twice/yr:    Allergies as of 07/17/2024 - Review Complete 07/10/2024  Allergen Reaction Noted   Ciprofloxacin Hives 12/16/2011    Review of Systems:    All systems reviewed and negative except where noted in HPI.   Physical Exam:  LMP 12/16/2011  Patient's last menstrual period was 12/16/2011.  General:   Alert,  Well-developed, well-nourished, pleasant and cooperative in NAD Lungs:  Respirations even and unlabored.  Clear throughout to auscultation.   No wheezes, crackles, or rhonchi. No acute distress. Heart:  Regular rate and rhythm; no murmurs, clicks, rubs, or gallops. Abdomen:  Normal bowel sounds.  No bruits.  Soft, and non-distended without masses, hepatosplenomegaly or hernias noted.  No Tenderness.  No guarding or rebound tenderness.    Neurologic:  Alert  and oriented x3;  grossly normal neurologically. Psych:  Alert and cooperative. Normal mood and affect.  Imaging Studies: DG Chest Portable 1 View Result Date: 07/10/2024 CLINICAL DATA:  Cough and shortness of breath. EXAM: PORTABLE CHEST 1 VIEW COMPARISON:  04/11/2024 and CT chest 09/10/2023. FINDINGS: Trachea is midline. Heart size stable. Possible opacification in the medial right lung base. Lungs are otherwise clear. No pleural fluid. IMPRESSION: Possible opacification in the medial right lung base. Difficult to exclude pneumonia. Followup PA and  lateral chest X-ray is recommended in 3-4 weeks following trial of antibiotic therapy to ensure resolution and exclude underlying malignancy. Electronically Signed   By: Newell Eke M.D.   On: 07/10/2024 11:30    Labs: CBC    Component Value Date/Time   WBC 15.1 (H) 07/10/2024 0917   RBC 4.91 07/10/2024 0917   HGB 15.2 (H) 07/10/2024 0917   HCT 45.9 07/10/2024 0917   PLT 225 07/10/2024 0917   MCV 93.5 07/10/2024 0917    CMP     Component Value Date/Time   NA 135 07/10/2024 1236   K 3.8 07/10/2024 1236   CL 98 07/10/2024 1236   CO2 29 07/10/2024 1236   GLUCOSE 101 (H) 07/10/2024 1236   BUN 10 07/10/2024 1236   CREATININE 0.61 07/10/2024 1236   CREATININE 0.68 04/21/2024 1020   CALCIUM  8.3 (L) 07/10/2024 1236   PROT 7.4 07/10/2024 0917   ALBUMIN 4.2 07/10/2024 0917   AST 22 07/10/2024 0917   ALT 17 07/10/2024 0917   ALKPHOS 93 07/10/2024 0917   BILITOT 1.2 07/10/2024 0917   GFRNONAA >60 07/10/2024 1236   GFRAA >60 04/28/2020 1932    Assessment and Plan:   Adrienne Garcia is a 58 y.o. y/o female has been referred for   1.  Nausea and vomiting 2.  Diarrhea 3.  Abdominal pain 4.  Pneumonia diagnosed 07/10/2024 treated with antibiotics 5.  Colon cancer screening: Age 50 and has never had a colonoscopy  Plan: - After resolution of pneumonia, then schedule EGD and colonoscopy. - Update abdominal imaging.  Follow up  ***  Ellouise Console, PA-C

## 2024-07-17 ENCOUNTER — Ambulatory Visit: Admitting: Physician Assistant

## 2024-08-07 ENCOUNTER — Other Ambulatory Visit: Payer: Self-pay | Admitting: Family Medicine

## 2024-08-07 ENCOUNTER — Other Ambulatory Visit: Payer: Self-pay

## 2024-08-10 ENCOUNTER — Other Ambulatory Visit: Payer: Self-pay | Admitting: Family Medicine

## 2024-08-10 ENCOUNTER — Other Ambulatory Visit: Payer: Self-pay

## 2024-08-10 MED ORDER — PROMETHAZINE HCL 25 MG PO TABS
25.0000 mg | ORAL_TABLET | Freq: Four times a day (QID) | ORAL | 0 refills | Status: DC | PRN
  Filled 2024-08-10: qty 30, 8d supply, fill #0

## 2024-08-11 DIAGNOSIS — Z419 Encounter for procedure for purposes other than remedying health state, unspecified: Secondary | ICD-10-CM | POA: Diagnosis not present

## 2024-08-16 ENCOUNTER — Other Ambulatory Visit: Payer: Self-pay

## 2024-10-02 ENCOUNTER — Encounter: Payer: Self-pay | Admitting: Radiology

## 2024-10-06 ENCOUNTER — Other Ambulatory Visit: Payer: Self-pay

## 2024-10-06 ENCOUNTER — Other Ambulatory Visit: Payer: Self-pay | Admitting: Family Medicine

## 2024-10-07 NOTE — Telephone Encounter (Signed)
 Requested medication (s) are due for refill today: Yes  Requested medication (s) are on the active medication list: Yes  Last refill:  08/10/24  Future visit scheduled: No  Notes to clinic:  Unable to refill per protocol, cannot delegate.      Requested Prescriptions  Pending Prescriptions Disp Refills   promethazine  (PHENERGAN ) 25 MG tablet 30 tablet 0    Sig: Take 1 tablet (25 mg total) by mouth every 6 (six) hours as needed for nausea or vomiting.     Not Delegated - Gastroenterology: Antiemetics Failed - 10/07/2024  9:32 AM      Failed - This refill cannot be delegated      Passed - Valid encounter within last 6 months    Recent Outpatient Visits           4 months ago Primary osteoarthritis of right knee   [place] [facility] Duanne Butler DASEN, MD   5 months ago Aspiration pneumonia of right middle lobe, unspecified aspiration pneumonia type [facility])   [place] [facility] Duanne Butler DASEN, MD   6 months ago High blood sugar   [place] [facility] Pick[place], Butler DASEN, MD   1 year ago Chronic pain of both knees   [place] [facility] Pickard, Butler DASEN, MD   1 year ago Primary osteoarthritis of right knee    [place] [facility] Pickard, Butler DASEN, MD

## 2024-10-09 ENCOUNTER — Other Ambulatory Visit: Payer: Self-pay

## 2024-10-11 DIAGNOSIS — Z419 Encounter for procedure for purposes other than remedying health state, unspecified: Secondary | ICD-10-CM | POA: Diagnosis not present

## 2024-10-13 ENCOUNTER — Other Ambulatory Visit: Payer: Self-pay

## 2024-10-13 MED ORDER — PROMETHAZINE HCL 25 MG PO TABS
25.0000 mg | ORAL_TABLET | Freq: Four times a day (QID) | ORAL | 0 refills | Status: AC | PRN
  Filled 2024-10-13 – 2024-10-30 (×2): qty 30, 8d supply, fill #0

## 2024-10-24 ENCOUNTER — Other Ambulatory Visit: Payer: Self-pay

## 2024-10-30 ENCOUNTER — Other Ambulatory Visit: Payer: Self-pay
